# Patient Record
Sex: Female | Born: 1980 | Race: Black or African American | Hispanic: No | Marital: Single | Smoking: Former smoker
Health system: Southern US, Community
[De-identification: ages and names within clinical notes are randomized; demographics above are authoritative.]

## PROBLEM LIST (undated history)

## (undated) ENCOUNTER — Inpatient Hospital Stay (HOSPITAL_COMMUNITY): Payer: Self-pay

## (undated) DIAGNOSIS — I2510 Atherosclerotic heart disease of native coronary artery without angina pectoris: Secondary | ICD-10-CM

## (undated) DIAGNOSIS — R87629 Unspecified abnormal cytological findings in specimens from vagina: Secondary | ICD-10-CM

## (undated) DIAGNOSIS — F32A Depression, unspecified: Secondary | ICD-10-CM

## (undated) DIAGNOSIS — F1010 Alcohol abuse, uncomplicated: Secondary | ICD-10-CM

## (undated) DIAGNOSIS — K219 Gastro-esophageal reflux disease without esophagitis: Secondary | ICD-10-CM

## (undated) DIAGNOSIS — Z9151 Personal history of suicidal behavior: Secondary | ICD-10-CM

## (undated) DIAGNOSIS — D649 Anemia, unspecified: Secondary | ICD-10-CM

## (undated) DIAGNOSIS — E785 Hyperlipidemia, unspecified: Secondary | ICD-10-CM

## (undated) DIAGNOSIS — I10 Essential (primary) hypertension: Secondary | ICD-10-CM

## (undated) DIAGNOSIS — F329 Major depressive disorder, single episode, unspecified: Secondary | ICD-10-CM

## (undated) DIAGNOSIS — Z915 Personal history of self-harm: Secondary | ICD-10-CM

## (undated) HISTORY — PX: LEEP: SHX91

## (undated) HISTORY — DX: Anemia, unspecified: D64.9

## (undated) HISTORY — PX: CORONARY ANGIOPLASTY: SHX604

## (undated) HISTORY — DX: Depression, unspecified: F32.A

## (undated) HISTORY — DX: Major depressive disorder, single episode, unspecified: F32.9

## (undated) HISTORY — PX: CARDIAC CATHETERIZATION: SHX172

---

## 2008-02-15 ENCOUNTER — Emergency Department (HOSPITAL_COMMUNITY): Admission: EM | Admit: 2008-02-15 | Discharge: 2008-02-15 | Payer: Self-pay | Admitting: Emergency Medicine

## 2008-02-29 ENCOUNTER — Ambulatory Visit: Payer: Self-pay | Admitting: Gastroenterology

## 2008-02-29 ENCOUNTER — Inpatient Hospital Stay (HOSPITAL_COMMUNITY): Admission: EM | Admit: 2008-02-29 | Discharge: 2008-03-03 | Payer: Self-pay | Admitting: Emergency Medicine

## 2009-05-28 ENCOUNTER — Emergency Department (HOSPITAL_COMMUNITY): Admission: EM | Admit: 2009-05-28 | Discharge: 2009-05-28 | Payer: Self-pay | Admitting: Emergency Medicine

## 2009-05-29 ENCOUNTER — Inpatient Hospital Stay (HOSPITAL_COMMUNITY): Admission: EM | Admit: 2009-05-29 | Discharge: 2009-05-30 | Payer: Self-pay | Admitting: Emergency Medicine

## 2010-06-01 LAB — URINALYSIS, ROUTINE W REFLEX MICROSCOPIC
Glucose, UA: NEGATIVE mg/dL
Leukocytes, UA: NEGATIVE
Nitrite: NEGATIVE
Nitrite: NEGATIVE
Protein, ur: 100 mg/dL — AB
Protein, ur: 100 mg/dL — AB

## 2010-06-01 LAB — CBC
HCT: 31.7 % — ABNORMAL LOW (ref 36.0–46.0)
HCT: 35.4 % — ABNORMAL LOW (ref 36.0–46.0)
Hemoglobin: 10.4 g/dL — ABNORMAL LOW (ref 12.0–15.0)
Hemoglobin: 13.6 g/dL (ref 12.0–15.0)
MCHC: 32.9 g/dL (ref 30.0–36.0)
MCHC: 34.5 g/dL (ref 30.0–36.0)
MCV: 91.3 fL (ref 78.0–100.0)
MCV: 89.9 fL (ref 78.0–100.0)
RBC: 3.89 MIL/uL (ref 3.87–5.11)
RBC: 4.4 MIL/uL (ref 3.87–5.11)
RDW: 13.7 % (ref 11.5–15.5)
RDW: 13.8 % (ref 11.5–15.5)

## 2010-06-01 LAB — URINE MICROSCOPIC-ADD ON

## 2010-06-01 LAB — COMPREHENSIVE METABOLIC PANEL
ALT: 17 U/L (ref 0–35)
Albumin: 2.8 g/dL — ABNORMAL LOW (ref 3.5–5.2)
BUN: 9 mg/dL (ref 6–23)
CO2: 21 mEq/L (ref 19–32)
Chloride: 108 mEq/L (ref 96–112)
Creatinine, Ser: 0.86 mg/dL (ref 0.4–1.2)
GFR calc Af Amer: >60 mL/min (ref >60)
GFR calc non Af Amer: >60 mL/min (ref >60)
GFR calc non Af Amer: >60 mL/min (ref >60)
Glucose, Bld: 120 mg/dL — ABNORMAL HIGH (ref 70–99)
Potassium: 3.5 mEq/L (ref 3.5–5.1)
Potassium: 3.2 mEq/L — ABNORMAL LOW (ref 3.5–5.1)
Sodium: 143 mEq/L (ref 135–145)
Total Bilirubin: 0.7 mg/dL (ref 0.3–1.2)
Total Protein: 5.7 g/dL — ABNORMAL LOW (ref 6.0–8.3)

## 2010-06-01 LAB — DIFFERENTIAL
Basophils Absolute: 0 10*3/uL (ref 0.0–0.1)
Basophils Relative: 0 % (ref 0–1)
Eosinophils Absolute: 0 10*3/uL (ref 0.0–0.7)
Eosinophils Absolute: 0 10*3/uL (ref 0.0–0.7)
Eosinophils Relative: 0 % (ref 0–5)
Lymphocytes Relative: 20 % (ref 12–46)
Monocytes Absolute: 0.3 10*3/uL (ref 0.1–1.0)
Monocytes Relative: 5 % (ref 3–12)
Monocytes Relative: 4 % (ref 3–12)
Neutrophils Relative %: 75 % (ref 43–77)
Neutrophils Relative %: 71 % (ref 43–77)

## 2010-06-01 LAB — BASIC METABOLIC PANEL
Chloride: 111 mEq/L (ref 96–112)
GFR calc Af Amer: >60 mL/min (ref >60)

## 2010-06-01 LAB — CULTURE, BLOOD (ROUTINE X 2)

## 2010-06-01 LAB — PREGNANCY, URINE: Preg Test, Ur: NEGATIVE

## 2010-06-01 LAB — GASTRIC OCCULT BLOOD (1-CARD TO LAB)
Occult Blood, Gastric: POSITIVE — AB
pH, Gastric: 4

## 2010-07-21 NOTE — H&P (Signed)
[name], [name] NO.:  [id number]   MEDICAL RECORD NO.:  [id number]          PATIENT TYPE:  INP   LOCATION:  1528                         FACILITY:  [facility]   PHYSICIAN:  Vania Rea, M.D. DATE OF BIRTH:  Jun 08, 1980   DATE OF ADMISSION:  02/29/2008  DATE OF DISCHARGE:                              HISTORY & PHYSICAL   CHIEF COMPLAINT:  Intractable vomiting.   HISTORY OF PRESENT ILLNESS:  This is a 30 year old African American lady  who previously reported herself to be in good health until she started  vomiting since this morning.  Patient says she has been vomiting and dry-  heaving, and streaks of blood have been coming up in the vomitus.  In  the emergency room, the patient has received antiemetics but has  continued to vomit clear liquid with steaks of blood in the vomitus.  Patient says the vomiting is accompanied by left flank pain.  She has  been having no diarrhea or constipation.  No fever, no chills.  She  denies any frequency or dysuria.  She denies any vaginal discharge.  She  denies any recent foods.  She did a week ago restart antihypertensives  of hydrochlorothiazide and doxycycline for her axillary boils.   Patient gives a history of a similar episode occurring about one year  ago for which she was admitted to the medical center and investigated.  Says she had an upper endoscopy and no cause of the persistent vomiting  was found.   PAST MEDICAL HISTORY:  1. Hypertension.  2. Recent boils in the axilla along the neck.  3. History of what sounds like a cone biopsy of the cervix.   MEDICATIONS:  1. HCTZ 25 mg daily.  2. Minocycline/doxycycline unknown dose.   ALLERGIES:  No known drug allergies.   SOCIAL HISTORY:  Smokes occasionally.  Says she drinks alcohol  occasionally.  Drank 1-2 cups of egg nog yesterday.   ALLERGIES:  No history of marijuana, cocaine, or illicit drug use.  Works as a Conservation officer, nature at [employer].   FAMILY  HISTORY:  Does not know her father.  Otherwise has a history of  diabetes, hypertension, and cervical cancer in her family.   REVIEW OF SYSTEMS:  Other than noted above, patient denies any problems  on a 10-point review of systems.   PHYSICAL EXAMINATION:  An ill-looking young girl lying in the stretcher,  retching frequently.  Looking very uncomfortable.  VITALS:  Temperature 99.1, pulse 101, respirations 18, blood pressure  136/97.  She is saturating 100% on room air.  Pupils are equal and round.  Mucous membranes are pink, anicteric.  She  is dehydrated.  No cervical lymphadenopathy or thyromegaly.  CHEST:  Clear to auscultation bilaterally.  CARDIOVASCULAR:  Regular rhythm.  No murmur.  ABDOMEN:  She is tender in the epigastrium.  She has increased bowel  sounds.  EXTREMITIES:  Without edema.  CENTRAL NERVOUS SYSTEM:  Cranial nerves II-XII are grossly intact.  She  has no focal neurological deficits.   LABS:  CBC is unremarkable.  Her serum chemistry is significant for  sodium 136, potassium 2.6, glucose 127, BUN 16, creatinine 1.06,  otherwise unremarkable.  Her lipase is 31.  Her urinalysis:  She has  turbid urine, specific gravity 1.035, small amount of ketones, 100  protein, negative for nitrites, small-moderate leukocyte esterase.  Her  urine pregnancy test is negative.   ASSESSMENT:  1. Acute gastroenteritis of unclear etiology with possible alcohol-      induced via egg nog, possibly induced via medication.  2. Severe hypokalemia associated with intractable vomiting.   PLAN:  1. Will admit this lady for hydration, repletion of her potassium, and      will administer anti-emetics and give her the benefit of a GI      consult with a possible upper endoscopy.  2. Will attempt to get a copy of her admission records from [facility].  3. Other plans as per orders.      Vania Rea, M.D.  Electronically Signed     LC/MEDQ  D:  02/29/2008  T:   02/29/2008  Job:  [id number]

## 2010-07-21 NOTE — Discharge Summary (Signed)
NAMESANVIKA, [name] NO.:  [id number]   MEDICAL RECORD NO.:  [id number]          PATIENT TYPE:  INP   LOCATION:  1528                         FACILITY:  [facility]   PHYSICIAN:  [name], M.D.    DATE OF BIRTH:  09-Mar-1980   DATE OF ADMISSION:  02/29/2008  DATE OF DISCHARGE:  03/03/2008                               DISCHARGE SUMMARY   DISCHARGE DIAGNOSES:  1. Intractable nausea and vomiting associated with reported mild      hematemesis, etiology unclear; possibly antibiotic-induced.  2. Esophagitis in the distal esophagus per EGD on February 29, 2008.      The esophagitis was thought to be related to the nausea and      vomiting rather than the cause.  3. Hiatal hernia per EGD.  4. Hypokalemia.  5. Hypertension.  6. Possible intolerance to doxycycline.   DISCHARGE MEDICATIONS:  1. Protonix 40 mg daily.  2. Potassium chloride 20 mEq a half a tablet daily.  3. Hydrochlorothiazide 25 mg half a tablet daily.  4. Phenergan 25 mg 1 tablet every 4-6 hours as needed for nausea.   DISCHARGE DISPOSITION:  The patient is being discharged to home in  improved and stable condition.  She was given the number to the  [facility].  She was advised to follow up with the [facility] or a physician of choice in 2-3 weeks.   CONSULTATIONS:  Ariana Bunting, MD and Ariana Rod, MD.   PROCEDURES PERFORMED:  1. Ultrasound of the abdomen on March 01, 2008.  The results      revealed negative abdominal ultrasound.  No gallstones.  2. EGD performed on February 29, 2008.  The results revealed      esophagitis in the distal esophagus.  Hiatal hernia.  Otherwise      normal examination.   HISTORY OF PRESENT ILLNESS:  The patient is a 30 year old woman with a  past medical history significant for recent treatment for an axillary  infection with doxycycline who presented to the emergency department on  February 29, 2008, with a chief complaint of intractable  nausea and  vomiting.  The patient also reported seeing streaks of blood in her  emesis.  She was recently started on antibiotic treatment with  doxycycline for axillary boils and hydrochlorothiazide for treatment  of hypertension by the physician at a local urgent care.  When the  patient was evaluated in the emergency department she was noted to be  borderline febrile and otherwise hemodynamically stable.  An acute  abdominal series was ordered and it revealed a normal bowel gas pattern  and clear lungs.  Her urinalysis revealed small leukocytes and her urine  pregnancy test was negative.  Her WBC was within normal limits at 9.2.  Her lipase was 31.  Her serum potassium was 2.6.  Her liver  transaminases were within normal limits.  The patient was admitted for  further evaluation and management.   PAST MEDICAL HISTORY:  1. Intractable nausea and vomiting.  The patient was started on      intravenous Protonix 40 mg IV q.12  h.  Maintenance IV fluids were      given.  As needed antiemetic therapy was initiated with Zofran 4 mg      every 4 hours.  In addition to the Protonix, Reglan was given      scheduled at 10 mg IV q.6 h.  The patient was repleted with      potassium chloride intravenously.  Gastroenterologist, Dr. Christella Kelley,      was consulted and he evaluated the patient on the day of admission.      He performed an EGD which revealed esophagitis in the distal      esophagus and a hiatal hernia.  Per his impression, the esophagitis      was likely the result of persistent vomiting rather than the cause.      He believed that the nausea and vomiting may have been secondary to      an infectious gastroenteritis versus nausea and vomiting induced by      doxycycline which was apparently taken by the patient for a local      axillary infection.  He added scheduled Zofran and Phenergan.  Dr.      Charna Kelley provided the followup gastroenterology consultation.      She recommended  obtaining an ultrasound of the abdomen to rule out      biliary disease.  The ultrasound of the abdomen was normal and      there was no evidence of gallstones.  The patient's diet was      eventually advanced to a clear liquid diet.  The nausea and      vomiting eventually completely resolved.  Her diet was advanced to      a full liquid diet which she has tolerated well.  Upon discharge,      the patient was advised to avoid doxycycline and to continue      therapy with Protonix orally.  In addition, Phenergan was      prescribed as needed for recurrent nausea and vomiting.  2. Hypertension.  The patient's blood pressure was stable and      controlled off of hydrochlorothiazide during the hospitalization.      Upon discharge, she was advised to restart hydrochlorothiazide at      half the dose (12.5 mg daily).  As noted above, the patient's serum      potassium was 2.6 at the time of the initial hospital assessment.      The hypokalemia was felt to be secondary to hydrochlorothiazide      therapy and intractable nausea and vomiting.  Her magnesium level      was assessed and it was within normal limits at 1.9.  The patient      was repleted with potassium chloride orally and via the IV fluids.      Upon discharge, she was advised to continue potassium      supplementation.   LABS PENDING:  Urine LCR for chlamydia and gonorrhea.      [name], M.D.  Electronically Signed     DF/MEDQ  D:  03/03/2008  T:  03/03/2008  Job:  [id number]

## 2010-12-11 LAB — RAPID URINE DRUG SCREEN, HOSP PERFORMED
Barbiturates: NOT DETECTED
Benzodiazepines: NOT DETECTED
Cocaine: NOT DETECTED
Opiates: NOT DETECTED

## 2010-12-11 LAB — TSH: TSH: 0.486 u[IU]/mL (ref 0.350–4.500)

## 2010-12-11 LAB — DIFFERENTIAL
Basophils Absolute: 0.1 10*3/uL (ref 0.0–0.1)
Eosinophils Relative: 1 % (ref 0–5)
Lymphocytes Relative: 33 % (ref 12–46)
Lymphs Abs: 3 10*3/uL (ref 0.7–4.0)
Monocytes Relative: 6 % (ref 3–12)
Neutro Abs: 5.4 10*3/uL (ref 1.7–7.7)

## 2010-12-11 LAB — CBC
HCT: 34.7 % — ABNORMAL LOW (ref 36.0–46.0)
HCT: 36.1 % (ref 36.0–46.0)
HCT: 43.2 % (ref 36.0–46.0)
Hemoglobin: 11.8 g/dL — ABNORMAL LOW (ref 12.0–15.0)
MCHC: 34.1 g/dL (ref 30.0–36.0)
MCV: 90.3 fL (ref 78.0–100.0)
MCV: 90.8 fL (ref 78.0–100.0)
MCV: 91.7 fL (ref 78.0–100.0)
Platelets: 226 10*3/uL (ref 150–400)
RBC: 3.79 MIL/uL — ABNORMAL LOW (ref 3.87–5.11)
RBC: 3.97 MIL/uL (ref 3.87–5.11)
RDW: 13.4 % (ref 11.5–15.5)
WBC: 7.8 10*3/uL (ref 4.0–10.5)
WBC: 8 10*3/uL (ref 4.0–10.5)

## 2010-12-11 LAB — COMPREHENSIVE METABOLIC PANEL
AST: 26 U/L (ref 0–37)
Albumin: 4.4 g/dL (ref 3.5–5.2)
BUN: 16 mg/dL (ref 6–23)
BUN: 4 mg/dL — ABNORMAL LOW (ref 6–23)
CO2: 20 mEq/L (ref 19–32)
Calcium: 10.2 mg/dL (ref 8.4–10.5)
Chloride: 111 mEq/L (ref 96–112)
Creatinine, Ser: 0.78 mg/dL (ref 0.4–1.2)
Creatinine, Ser: 1.06 mg/dL (ref 0.4–1.2)
GFR calc Af Amer: >60 mL/min (ref >60)
GFR calc non Af Amer: >60 mL/min (ref >60)
Total Bilirubin: 0.9 mg/dL (ref 0.3–1.2)

## 2010-12-11 LAB — URINALYSIS, ROUTINE W REFLEX MICROSCOPIC
Glucose, UA: NEGATIVE mg/dL
Hgb urine dipstick: NEGATIVE
Ketones, ur: 15 mg/dL — AB
Nitrite: NEGATIVE
pH: 6 (ref 5.0–8.0)

## 2010-12-11 LAB — GC/CHLAMYDIA PROBE AMP, URINE
Chlamydia, Swab/Urine, PCR: NEGATIVE
GC Probe Amp, Urine: NEGATIVE

## 2010-12-11 LAB — BASIC METABOLIC PANEL
BUN: 14 mg/dL (ref 6–23)
CO2: 21 mEq/L (ref 19–32)
Calcium: 9.1 mg/dL (ref 8.4–10.5)
Chloride: 112 mEq/L (ref 96–112)
Creatinine, Ser: 0.84 mg/dL (ref 0.4–1.2)
Creatinine, Ser: 0.84 mg/dL (ref 0.4–1.2)
GFR calc Af Amer: >60 mL/min (ref >60)
GFR calc Af Amer: >60 mL/min (ref >60)
Sodium: 142 mEq/L (ref 135–145)

## 2010-12-11 LAB — URINE MICROSCOPIC-ADD ON

## 2010-12-11 LAB — POCT PREGNANCY, URINE: Preg Test, Ur: NEGATIVE

## 2010-12-11 LAB — CARDIAC PANEL(CRET KIN+CKTOT+MB+TROPI): Total CK: 193 U/L — ABNORMAL HIGH (ref 7–177)

## 2013-08-08 ENCOUNTER — Emergency Department (HOSPITAL_COMMUNITY): Payer: Medicaid Other

## 2013-08-08 ENCOUNTER — Encounter (HOSPITAL_COMMUNITY): Payer: Self-pay | Admitting: Emergency Medicine

## 2013-08-08 ENCOUNTER — Inpatient Hospital Stay (HOSPITAL_COMMUNITY)
Admission: EM | Admit: 2013-08-08 | Discharge: 2013-08-11 | DRG: 781 | Disposition: A | Discharge status: Home / Self Care | Payer: Medicaid Other | Attending: Family Medicine | Admitting: Family Medicine

## 2013-08-08 DIAGNOSIS — Z8719 Personal history of other diseases of the digestive system: Secondary | ICD-10-CM

## 2013-08-08 DIAGNOSIS — E876 Hypokalemia: Secondary | ICD-10-CM

## 2013-08-08 DIAGNOSIS — O211 Hyperemesis gravidarum with metabolic disturbance: Secondary | ICD-10-CM

## 2013-08-08 DIAGNOSIS — F1010 Alcohol abuse, uncomplicated: Secondary | ICD-10-CM | POA: Diagnosis present

## 2013-08-08 DIAGNOSIS — O210 Mild hyperemesis gravidarum: Secondary | ICD-10-CM | POA: Diagnosis present

## 2013-08-08 DIAGNOSIS — I10 Essential (primary) hypertension: Secondary | ICD-10-CM | POA: Diagnosis present

## 2013-08-08 DIAGNOSIS — O99340 Other mental disorders complicating pregnancy, unspecified trimester: Secondary | ICD-10-CM | POA: Diagnosis present

## 2013-08-08 DIAGNOSIS — E860 Dehydration: Secondary | ICD-10-CM

## 2013-08-08 DIAGNOSIS — O10019 Pre-existing essential hypertension complicating pregnancy, unspecified trimester: Secondary | ICD-10-CM

## 2013-08-08 DIAGNOSIS — Z9889 Other specified postprocedural states: Secondary | ICD-10-CM | POA: Diagnosis present

## 2013-08-08 DIAGNOSIS — Z87898 Personal history of other specified conditions: Secondary | ICD-10-CM

## 2013-08-08 DIAGNOSIS — Z3490 Encounter for supervision of normal pregnancy, unspecified, unspecified trimester: Secondary | ICD-10-CM

## 2013-08-08 DIAGNOSIS — O3443 Maternal care for other abnormalities of cervix, third trimester: Secondary | ICD-10-CM | POA: Diagnosis present

## 2013-08-08 DIAGNOSIS — K219 Gastro-esophageal reflux disease without esophagitis: Secondary | ICD-10-CM | POA: Diagnosis present

## 2013-08-08 HISTORY — DX: Gastro-esophageal reflux disease without esophagitis: K21.9

## 2013-08-08 HISTORY — DX: Essential (primary) hypertension: I10

## 2013-08-08 LAB — POC URINE PREG, ED: PREG TEST UR: POSITIVE — AB

## 2013-08-08 LAB — URINALYSIS, ROUTINE W REFLEX MICROSCOPIC
Glucose, UA: NEGATIVE mg/dL
Glucose, UA: NEGATIVE mg/dL
HGB URINE DIPSTICK: NEGATIVE
Hgb urine dipstick: NEGATIVE
Ketones, ur: 15 mg/dL — AB
LEUKOCYTES UA: NEGATIVE
NITRITE: NEGATIVE
NITRITE: NEGATIVE
PROTEIN: 100 mg/dL — AB
Protein, ur: 100 mg/dL — AB
SPECIFIC GRAVITY, URINE: 1.023 (ref 1.005–1.030)
Specific Gravity, Urine: >1.03 — ABNORMAL HIGH (ref 1.005–1.030)
UROBILINOGEN UA: 1 mg/dL (ref 0.0–1.0)
Urobilinogen, UA: 1 mg/dL (ref 0.0–1.0)
pH: 6 (ref 5.0–8.0)
pH: 6 (ref 5.0–8.0)

## 2013-08-08 LAB — COMPREHENSIVE METABOLIC PANEL
ALK PHOS: 76 U/L (ref 39–117)
ALT: 15 U/L (ref 0–35)
AST: 16 U/L (ref 0–37)
Albumin: 4.3 g/dL (ref 3.5–5.2)
BUN: 12 mg/dL (ref 6–23)
CO2: 18 meq/L — AB (ref 19–32)
Calcium: 10.4 mg/dL (ref 8.4–10.5)
Chloride: 99 mEq/L (ref 96–112)
Creatinine, Ser: 0.92 mg/dL (ref 0.50–1.10)
GFR, EST NON AFRICAN AMERICAN: 82 mL/min — AB (ref >90)
GLUCOSE: 139 mg/dL — AB (ref 70–99)
POTASSIUM: 2.9 meq/L — AB (ref 3.7–5.3)
SODIUM: 138 meq/L (ref 137–147)
Total Bilirubin: 0.5 mg/dL (ref 0.3–1.2)
Total Protein: 8.5 g/dL — ABNORMAL HIGH (ref 6.0–8.3)

## 2013-08-08 LAB — GAMMA GT: GGT: 46 U/L (ref 7–51)

## 2013-08-08 LAB — CBC WITH DIFFERENTIAL/PLATELET
Basophils Absolute: 0 10*3/uL (ref 0.0–0.1)
Basophils Relative: 0 % (ref 0–1)
EOS PCT: 2 % (ref 0–5)
Eosinophils Absolute: 0.1 10*3/uL (ref 0.0–0.7)
HCT: 39.2 % (ref 36.0–46.0)
HEMOGLOBIN: 14.3 g/dL (ref 12.0–15.0)
LYMPHS ABS: 3.3 10*3/uL (ref 0.7–4.0)
LYMPHS PCT: 41 % (ref 12–46)
MCH: 31.1 pg (ref 26.0–34.0)
MCHC: 36.5 g/dL — ABNORMAL HIGH (ref 30.0–36.0)
MCV: 85.2 fL (ref 78.0–100.0)
MONOS PCT: 8 % (ref 3–12)
Monocytes Absolute: 0.6 10*3/uL (ref 0.1–1.0)
NEUTROS PCT: 49 % (ref 43–77)
Neutro Abs: 3.9 10*3/uL (ref 1.7–7.7)
PLATELETS: 270 10*3/uL (ref 150–400)
RBC: 4.6 MIL/uL (ref 3.87–5.11)
RDW: 12.4 % (ref 11.5–15.5)
WBC: 7.9 10*3/uL (ref 4.0–10.5)

## 2013-08-08 LAB — URINE MICROSCOPIC-ADD ON

## 2013-08-08 LAB — RAPID URINE DRUG SCREEN, HOSP PERFORMED
AMPHETAMINES: NOT DETECTED
BARBITURATES: NOT DETECTED
BENZODIAZEPINES: NOT DETECTED
COCAINE: NOT DETECTED
Opiates: NOT DETECTED
TETRAHYDROCANNABINOL: POSITIVE — AB

## 2013-08-08 LAB — HCG, QUANTITATIVE, PREGNANCY: HCG, BETA CHAIN, QUANT, S: 4895 m[IU]/mL — AB (ref <5)

## 2013-08-08 LAB — TSH: TSH: 0.52 u[IU]/mL (ref 0.350–4.500)

## 2013-08-08 LAB — T4, FREE: Free T4: 1.37 ng/dL (ref 0.80–1.80)

## 2013-08-08 LAB — T3, FREE: T3, Free: 3 pg/mL (ref 2.3–4.2)

## 2013-08-08 LAB — LIPASE, BLOOD: LIPASE: 35 U/L (ref 11–59)

## 2013-08-08 MED ORDER — ACETAMINOPHEN 325 MG PO TABS: 650.0000 mg | ORAL_TABLET | ORAL | Status: DC | PRN

## 2013-08-08 MED ORDER — ONDANSETRON HCL 4 MG/2ML IJ SOLN
4.0000 mg | Freq: Once | INTRAMUSCULAR | Status: AC
  Administered 2013-08-08: 4 mg via INTRAVENOUS
  Filled 2013-08-08: qty 2

## 2013-08-08 MED ORDER — MAGNESIUM SULFATE 40 MG/ML IJ SOLN
2.0000 g | Freq: Once | INTRAMUSCULAR | Status: DC
  Filled 2013-08-08: qty 50

## 2013-08-08 MED ORDER — ONDANSETRON HCL 4 MG PO TABS: 4.0000 mg | ORAL_TABLET | Freq: Four times a day (QID) | ORAL | Status: DC | PRN

## 2013-08-08 MED ORDER — METOCLOPRAMIDE HCL 5 MG/ML IJ SOLN
10.0000 mg | Freq: Once | INTRAMUSCULAR | Status: AC
  Administered 2013-08-08: 10 mg via INTRAVENOUS
  Filled 2013-08-08: qty 2

## 2013-08-08 MED ORDER — POTASSIUM CHLORIDE 10 MEQ/100ML IV SOLN
10.0000 meq | Freq: Once | INTRAVENOUS | Status: AC
  Administered 2013-08-08: 10 meq via INTRAVENOUS
  Filled 2013-08-08: qty 100

## 2013-08-08 MED ORDER — FAMOTIDINE IN NACL 20-0.9 MG/50ML-% IV SOLN
20.0000 mg | Freq: Two times a day (BID) | INTRAVENOUS | Status: DC
  Administered 2013-08-08 – 2013-08-10 (×6): 20 mg via INTRAVENOUS
  Filled 2013-08-08 (×7): qty 50

## 2013-08-08 MED ORDER — ALUM & MAG HYDROXIDE-SIMETH 200-200-20 MG/5ML PO SUSP: 30.0000 mL | ORAL | Status: DC | PRN

## 2013-08-08 MED ORDER — GUAIFENESIN 100 MG/5ML PO SOLN: 15.0000 mL | ORAL | Status: DC | PRN

## 2013-08-08 MED ORDER — SODIUM CHLORIDE 0.9 % IV SOLN
Freq: Once | INTRAVENOUS | Status: AC
  Administered 2013-08-08: 13:00:00 via INTRAVENOUS
  Filled 2013-08-08: qty 1000

## 2013-08-08 MED ORDER — MENTHOL 3 MG MT LOZG: 1.0000 | LOZENGE | OROMUCOSAL | Status: DC | PRN

## 2013-08-08 MED ORDER — MAGNESIUM SULFATE 50 % IJ SOLN
Freq: Once | INTRAVENOUS | Status: AC
  Administered 2013-08-08: 18:00:00 via INTRAVENOUS
  Filled 2013-08-08: qty 50

## 2013-08-08 MED ORDER — PROMETHAZINE HCL 25 MG/ML IJ SOLN
12.5000 mg | Freq: Four times a day (QID) | INTRAMUSCULAR | Status: DC | PRN
  Administered 2013-08-08 – 2013-08-09 (×4): 25 mg via INTRAVENOUS
  Administered 2013-08-10: 12.5 mg via INTRAVENOUS
  Filled 2013-08-08 (×4): qty 1

## 2013-08-08 MED ORDER — ONDANSETRON HCL 4 MG/2ML IJ SOLN
4.0000 mg | Freq: Four times a day (QID) | INTRAMUSCULAR | Status: DC | PRN
  Administered 2013-08-08 – 2013-08-10 (×6): 4 mg via INTRAVENOUS
  Filled 2013-08-08 (×6): qty 2

## 2013-08-08 MED ORDER — KCL IN DEXTROSE-NACL 40-5-0.45 MEQ/L-%-% IV SOLN
INTRAVENOUS | Status: DC
  Administered 2013-08-08 – 2013-08-11 (×5): via INTRAVENOUS
  Filled 2013-08-08 (×11): qty 1000

## 2013-08-08 MED ORDER — MAGNESIUM SULFATE 40 MG/ML IJ SOLN: 2.0000 g | Freq: Once | INTRAMUSCULAR | Status: DC

## 2013-08-08 NOTE — ED Notes (Signed)
Dr. Bebe Shaggy made aware of pts critical low Potassium of 2.9.

## 2013-08-08 NOTE — Progress Notes (Signed)
Ur chart review completed.  

## 2013-08-08 NOTE — ED Notes (Signed)
Pt presents to the department with N/V for 3 days. Pt denies pain but states she feels very weak. Pt denies diarrhea. Pt has a hx of GERD and acid reflux. EMS reports pt actively vomited 2X en route to department. Pt denies fever. Pt is A&O X4.

## 2013-08-08 NOTE — ED Notes (Signed)
Dr. Bebe Shaggy at bedside with [place] machine.

## 2013-08-08 NOTE — ED Notes (Addendum)
Spoke Bridget at [facility] at [facility], pt is going to room # 302.

## 2013-08-08 NOTE — Consult Note (Addendum)
[facility] Medical Consultation  Caydance Writer LSL:[id number] DOB: 1980-05-21 DOA: 08/08/2013 PCP: No PCP Per Patient   Requesting physician: EDP Date of consultation: 6/3 Reason for consultation: nausea and vomiting  Impression/Recommendations Principal Problem:   Hyperemesis gravidarum Active Problems:   Dehydration   Hypokalemia   Recommendation: In My opinion she should be admitted to [facility] since this problem is purely obstetric in etiology or otherwise Request OB consult if admitted here. Needs IVF, supportive care, prenatal care and meds as appropriate per OB Replace K D/w EDP He will d/w OB and call us back as needed  Zannie Cove, MD [phone]   Chief Complaint: N/V  HPI:  32/F presented to the ER this am with intractable Nausea and vomiting for 3 days, No h/o abd pain or diarrhea No fevers or chills In ER noted to be pregnant and OB US confirmed Single intrauterine gestation, estimated age 68 weeks 4 days. TRH consulted for further management.    Review of Systems:  12 system review negative except per HPI  Past Medical History  Diagnosis Date  . GERD (gastroesophageal reflux disease)   . Acid reflux   . Hypertension    History reviewed. No pertinent past surgical history. Social History:  reports that she has never smoked. She does not have any smokeless tobacco history on file. She reports that she drinks alcohol. Her drug history is not on file.  No Known Allergies No family history on file.  Prior to Admission medications   Not on File   Physical Exam: Blood pressure 146/96, pulse 111, temperature 98.9 F (37.2 C), temperature source Oral, resp. rate 10, last menstrual period 07/01/2013, SpO2 100.00%. Filed Vitals:   08/08/13 0900  BP: 146/96  Pulse: 111  Temp:   Resp: 10     General:  AAOx3, no distress  HEENt: PERRLA, EOMI  Cardiovascular: S1S2/RRR  Respiratory: CTAB  Abdomen: soft, Nt, BS  present  Skin: no rashes  Musculoskeletal: no edema c/c  Psychiatric: appropriate mood and affect  Neurologic: non focal  Labs on Admission:  Basic Metabolic Panel:  Recent Labs Lab 08/08/13 0455  NA 138  K 2.9*  CL 99  CO2 18*  GLUCOSE 139*  BUN 12  CREATININE 0.92  CALCIUM 10.4   Liver Function Tests:  Recent Labs Lab 08/08/13 0455  AST 16  ALT 15  ALKPHOS 76  BILITOT 0.5  PROT 8.5*  ALBUMIN 4.3    Recent Labs Lab 08/08/13 0455  LIPASE 35   No results found for this basename: AMMONIA,  in the last 168 hours CBC:  Recent Labs Lab 08/08/13 0455  WBC 7.9  NEUTROABS 3.9  HGB 14.3  HCT 39.2  MCV 85.2  PLT 270   Cardiac Enzymes: No results found for this basename: CKTOTAL, CKMB, CKMBINDEX, TROPONINI,  in the last 168 hours BNP: No components found with this basename: POCBNP,  CBG: No results found for this basename: GLUCAP,  in the last 168 hours  Radiological Exams on Admission: US Ob Comp Less 14 Wks  08/08/2013   CLINICAL DATA:  Emesis and weakness  EXAM: OBSTETRIC <14 WK ULTRASOUND  TECHNIQUE: Transabdominal ultrasound was performed for evaluation of the gestation as well as the maternal uterus and adnexal regions.  COMPARISON:  None.  FINDINGS: Patient refused transvaginal evaluation.  Intrauterine gestational sac: Visualized/normal in shape.  Yolk sac:  Not visible  Embryo:  Not visible  MSD: 7.6  mm   5 w   4  d                US EDC: 04/06/2014  Maternal uterus/adnexae: Symmetrically size and normal appearing ovaries. Dominant follicle on the right, 2 cm in diameter. No adnexal mass. No free pelvic fluid.  IMPRESSION: Single intrauterine gestation, estimated age 70 weeks 4 days. As expected, no yolk sac or fetal pole yet visualized. Recommend follow-up quantitative B-HCG levels and follow-up US in 14 days to confirm and assess viability. This recommendation follows SRU consensus guidelines: Diagnostic Criteria for Nonviable Pregnancy Early in the  First Trimester. Malva Limes Engl J Med 2013; 161:0960-45; 369:1443-51.   Electronically Signed   By: Tiburcio PeaJonathan  Watts M.D.   On: 08/08/2013 07:11    Time spent: 35min  Zannie Covereetha Declyn Delsol [facility] Pager [phone]  If 7PM-7AM, please contact night-coverage [email or web address] Password [email or web address] 08/08/2013, 9:47 AM

## 2013-08-08 NOTE — H&P (Signed)
[name] is an 33 y.o. [phone] Unknown female.    Chief Complaint: Nausea and vomiting  HPI: Patient transferred here after presenting to [facility] with N/V.  Was found to be hypokalemic, with low bicarb and was also found to be pregnant.  She was sent over for re-hydration and treatment purposes. Pt. Has been admitted twice in the past with similar presentations in the non-pregnant state. Has undergone EGD x 2 with gastritis found and placed on PPI's. At both previous admissions in 2009 and in 2011, there was concern about alcohol use. Pt. Is a poor historian, partly due to medcations given. She may or may not have had hyperemesis in the past.  Hospitalists declined to admit her because of pregnancy.  Pt. Has h/o alcohol use in the past, although she reports little alcohol use today. Some blood tinged emesis noted. Received reglan and zofran at [facility].  TVUS shows IUGS, no yolk sac and no fetal pole.  Past Medical History  Diagnosis Date  . GERD (gastroesophageal reflux disease)   . Acid reflux   . Hypertension     History reviewed. No pertinent past surgical history.  No family history on file.  Social History:  reports that she has never smoked. She does not have any smokeless tobacco history on file. She reports that she drinks alcohol. Her drug history is not on file.  Allergies: No Known Allergies  No current facility-administered medications on file prior to encounter.   No current outpatient prescriptions on file prior to encounter.   ROS: Pertinent items are noted in HPI.  Physical Exam: Blood pressure 121/77, pulse 97, temperature 98.5 F (36.9 C), temperature source Oral, resp. rate 18, weight 182 lb 8 oz (82.781 kg), last menstrual period 07/01/2013, SpO2 100.00%. BP 121/77  Pulse 97  Temp(Src) 98.5 F (36.9 C) (Oral)  Resp 18  Wt 182 lb 8 oz (82.781 kg)  SpO2 100%  LMP 07/01/2013 General appearance: appears older than stated age, no distress and  lethargic Neck: supple, symmetrical, trachea midline Lungs: normal effort Heart: regular rate and rhythm Abdomen: soft, non-tender; bowel sounds normal; no masses,  no organomegaly Extremities: extremities normal, atraumatic, no cyanosis or edema Skin: Skin color, texture, turgor normal. No rashes or lesions Neurologic: Mental status: Alert, oriented, thought content appropriate, alertness: lethargic  Labs: Results for orders placed during the hospital encounter of 08/08/13 (from the past 24 hour(s))  CBC WITH DIFFERENTIAL   Collection Time    08/08/13  4:55 AM      Result Value Ref Range   WBC 7.9  4.0 - 10.5 K/uL   RBC 4.60  3.87 - 5.11 MIL/uL   Hemoglobin 14.3  12.0 - 15.0 g/dL   HCT 56.3  14.9 - 70.2 %   MCV 85.2  78.0 - 100.0 fL   MCH 31.1  26.0 - 34.0 pg   MCHC 36.5 (*) 30.0 - 36.0 g/dL   RDW 63.7  85.8 - 85.0 %   Platelets 270  150 - 400 K/uL   Neutrophils Relative % 49  43 - 77 %   Neutro Abs 3.9  1.7 - 7.7 K/uL   Lymphocytes Relative 41  12 - 46 %   Lymphs Abs 3.3  0.7 - 4.0 K/uL   Monocytes Relative 8  3 - 12 %   Monocytes Absolute 0.6  0.1 - 1.0 K/uL   Eosinophils Relative 2  0 - 5 %   Eosinophils Absolute 0.1  0.0 - 0.7 K/uL  Basophils Relative 0  0 - 1 %   Basophils Absolute 0.0  0.0 - 0.1 K/uL  COMPREHENSIVE METABOLIC PANEL   Collection Time    08/08/13  4:55 AM      Result Value Ref Range   Sodium 138  137 - 147 mEq/L   Potassium 2.9 (*) 3.7 - 5.3 mEq/L   Chloride 99  96 - 112 mEq/L   CO2 18 (*) 19 - 32 mEq/L   Glucose, Bld 139 (*) 70 - 99 mg/dL   BUN 12  6 - 23 mg/dL   Creatinine, Ser 4.090.92  0.50 - 1.10 mg/dL   Calcium 81.110.4  8.4 - 91.410.5 mg/dL   Total Protein 8.5 (*) 6.0 - 8.3 g/dL   Albumin 4.3  3.5 - 5.2 g/dL   AST 16  0 - 37 U/L   ALT 15  0 - 35 U/L   Alkaline Phosphatase 76  39 - 117 U/L   Total Bilirubin 0.5  0.3 - 1.2 mg/dL   GFR calc non Af Amer 82 (*) >90 mL/min   GFR calc Af Amer >90  >90 mL/min  LIPASE, BLOOD   Collection Time     08/08/13  4:55 AM      Result Value Ref Range   Lipase 35  11 - 59 U/L  URINALYSIS, ROUTINE W REFLEX MICROSCOPIC   Collection Time    08/08/13  5:36 AM      Result Value Ref Range   Color, Urine YELLOW  YELLOW   APPearance TURBID (*) CLEAR   Specific Gravity, Urine 1.023  1.005 - 1.030   pH 6.0  5.0 - 8.0   Glucose, UA NEGATIVE  NEGATIVE mg/dL   Hgb urine dipstick NEGATIVE  NEGATIVE   Bilirubin Urine SMALL (*) NEGATIVE   Ketones, ur 15 (*) NEGATIVE mg/dL   Protein, ur 782100 (*) NEGATIVE mg/dL   Urobilinogen, UA 1.0  0.0 - 1.0 mg/dL   Nitrite NEGATIVE  NEGATIVE   Leukocytes, UA SMALL (*) NEGATIVE  URINE MICROSCOPIC-ADD ON   Collection Time    08/08/13  5:36 AM      Result Value Ref Range   Squamous Epithelial / LPF MANY (*) RARE   WBC, UA 7-10  <3 WBC/hpf   RBC / HPF 0-2  <3 RBC/hpf   Bacteria, UA FEW (*) RARE   Casts HYALINE CASTS (*) NEGATIVE  POC URINE PREG, ED   Collection Time    08/08/13  5:49 AM      Result Value Ref Range   Preg Test, Ur POSITIVE (*) NEGATIVE  HCG, QUANTITATIVE, PREGNANCY   Collection Time    08/08/13  6:33 AM      Result Value Ref Range   hCG, Beta Chain, Quant, S 4895 (*) <5 mIU/mL    Ultrasound Studies:   [place] Ob Comp Less 14 Wks  08/08/2013   CLINICAL DATA:  Emesis and weakness  EXAM: OBSTETRIC <14 WK ULTRASOUND  TECHNIQUE: Transabdominal ultrasound was performed for evaluation of the gestation as well as the maternal uterus and adnexal regions.  COMPARISON:  None.  FINDINGS: Patient refused transvaginal evaluation.  Intrauterine gestational sac: Visualized/normal in shape.  Yolk sac:  Not visible  Embryo:  Not visible  MSD: 7.6  mm   5 w   4  d                US EDC: 04/06/2014  Maternal uterus/adnexae: Symmetrically size and normal appearing ovaries. Dominant follicle on the  right, 2 cm in diameter. No adnexal mass. No free pelvic fluid.  IMPRESSION: Single intrauterine gestation, estimated age 74 weeks 4 days. As expected, no yolk sac or fetal pole  yet visualized. Recommend follow-up quantitative B-HCG levels and follow-up US in 14 days to confirm and assess viability. This recommendation follows SRU consensus guidelines: Diagnostic Criteria for Nonviable Pregnancy Early in the First Trimester. Malva Limes Med 2013; 161:0960-45.   Electronically Signed   By: Tiburcio Pea M.D.   On: 08/08/2013 07:11    Assessment/Plan Patient Active Problem List   Diagnosis Date Noted  . Hyperemesis gravidarum 08/08/2013  . Dehydration 08/08/2013  . Hypokalemia 08/08/2013  . Hyperemesis complicating pregnancy, antepartum 08/08/2013   Admission. IV hydration Replete potassium Check TSH, UDS, BAL  Reva Bores 08/08/2013, 1:10 PM

## 2013-08-08 NOTE — ED Notes (Signed)
Pt talking on the phone. Is waiting  For carelink.

## 2013-08-08 NOTE — ED Provider Notes (Signed)
CSN: [id number]     Arrival date & time 08/08/13  0435 History   First MD Initiated Contact with Patient 08/08/13 [phone]     Chief Complaint  Patient presents with  . Emesis  . Weakness      Patient is a 33 y.o. female presenting with vomiting and weakness. The history is provided by the patient.  Emesis Severity:  Moderate Duration:  3 days Timing:  Intermittent Progression:  Worsening Chronicity:  New Relieved by:  Nothing Worsened by:  Nothing tried Associated symptoms: chills and cough   Associated symptoms: no abdominal pain, no diarrhea, no fever and no headaches   Weakness Pertinent negatives include no chest pain, no abdominal pain and no headaches.  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 GERD (gastroesophageal reflux disease)   . Acid reflux   . Hypertension    History reviewed. No pertinent past surgical history. No family history on file. History  Substance Use Topics  . Smoking status: Never Smoker   . Smokeless tobacco: Not on file  . Alcohol Use: Yes     Comment: occassionally   OB History   Grav Para Term Preterm Abortions TAB SAB Ect Mult Living                 Review of Systems  Constitutional: Positive for chills.  Cardiovascular: Negative for chest pain.  Gastrointestinal: Positive for vomiting. Negative for abdominal pain and diarrhea.  Genitourinary: Negative for vaginal bleeding.  Neurological: Positive for weakness. Negative for headaches.  All other systems reviewed and are negative.     Allergies  Review of patient's allergies indicates no known allergies.  Home Medications   Prior to Admission medications   Not on File   BP 152/107  Pulse 121  Temp(Src) 98.8 F (37.1 C) (Oral)   SpO2 90%  LMP 07/01/2013 Physical Exam CONSTITUTIONAL: Well developed/well nourished HEAD: Normocephalic/atraumatic EYES: EOMI/PERRL ENMT: Mucous membranes dry NECK: supple no meningeal signs SPINE:entire spine nontender CV: S1/S2 noted, no murmurs/rubs/gallops noted LUNGS: Lungs are clear to auscultation bilaterally, no apparent distress ABDOMEN: soft, nontender, no rebound or guarding GU:no cva tenderness NEURO: Pt is awake/alert, moves all extremitiesx4 EXTREMITIES: pulses normal, full ROM SKIN: warm, color normal PSYCH: no abnormalities of mood noted  ED Course  Procedures  5:10 AM Pt here with isolated vomiting for 3 days. She is without pain She appears dehydrated IV fluids ordered She reports some blood mixed in vomitus after multiple episodes of vomiting.  I doubt acute upper GI bleed 6:20 AM Pt continues to vomit She denies abd pain She is found to be pregnant She reports LMP end of April.  Bedside TA ultrasound unable to detect pregnancy Will order formal ultrasound D/w dr Debroah Loop at [facility] Suspicious this is hyperemesis gravidarum If no complication by [place], she can be admitted to [facility] 7:25 AM D/w internal medicine resident Will admit to medical service for rehydration  BP 128/96  Pulse 126  Temp(Src) 98.8 F (37.1 C) (Oral)  Resp 24  SpO2 99%  LMP 07/01/2013   Labs Review Labs Reviewed  CBC WITH DIFFERENTIAL - Abnormal; Notable for the following:    MCHC 36.5 (*)    All other components  within normal limits  COMPREHENSIVE METABOLIC PANEL - Abnormal; Notable for the following:    Potassium 2.9 (*)    CO2 18 (*)    Glucose, Bld 139 (*)    Total Protein 8.5 (*)    GFR calc non Af Amer 82 (*)    All other components within normal limits  URINALYSIS, ROUTINE W REFLEX MICROSCOPIC - Abnormal; Notable for the following:    APPearance TURBID (*)    Bilirubin Urine SMALL (*)    Ketones, ur 15 (*)    Protein, ur 100 (*)     Leukocytes, UA SMALL (*)    All other components within normal limits  URINE MICROSCOPIC-ADD ON - Abnormal; Notable for the following:    Squamous Epithelial / LPF MANY (*)    Bacteria, UA FEW (*)    Casts HYALINE CASTS (*)    All other components within normal limits  POC URINE PREG, ED - Abnormal; Notable for the following:    Preg Test, Ur POSITIVE (*)    All other components within normal limits  LIPASE, BLOOD  HCG, QUANTITATIVE, PREGNANCY    Imaging Review [place] Ob Comp Less 14 Wks  08/08/2013   CLINICAL DATA:  Emesis and weakness  EXAM: OBSTETRIC <14 WK ULTRASOUND  TECHNIQUE: Transabdominal ultrasound was performed for evaluation of the gestation as well as the maternal uterus and adnexal regions.  COMPARISON:  None.  FINDINGS: Patient refused transvaginal evaluation.  Intrauterine gestational sac: Visualized/normal in shape.  Yolk sac:  Not visible  Embryo:  Not visible  MSD: 7.6  mm   5 w   4  d                US EDC: 04/06/2014  Maternal uterus/adnexae: Symmetrically size and normal appearing ovaries. Dominant follicle on the right, 2 cm in diameter. No adnexal mass. No free pelvic fluid.  IMPRESSION: Single intrauterine gestation, estimated age 66 weeks 4 days. As expected, no yolk sac or fetal pole yet visualized. Recommend follow-up quantitative B-HCG levels and follow-up US in 14 days to confirm and assess viability. This recommendation follows SRU consensus guidelines: Diagnostic Criteria for Nonviable Pregnancy Early in the First Trimester. Malva Limes Engl J Med 2013; 161:0960-45; 369:1443-51.   Electronically Signed   By: Tiburcio PeaJonathan  Watts M.D.   On: 08/08/2013 07:11     Date: 08/08/2013  Rate: 117  Rhythm: sinus tachycardia  QRS Axis: normal  Intervals: normal  ST/T Wave abnormalities: nonspecific T wave changes  Conduction Disutrbances:none     MDM   Final diagnoses:  Pregnancy  Hypokalemia  Dehydration  Hyperemesis gravidarum    Nursing notes including past medical history and social  history reviewed and considered in documentation xrays reviewed and considered Labs/vital reviewed and considered     Joya Gaskinsonald W Paolina Karwowski, MD 08/08/13 [phone]

## 2013-08-08 NOTE — ED Provider Notes (Signed)
Internal medicine would prefer another service to admit Family medicine is not accepting new patients this morning Call placed to [facility] for admission  Joya Gaskins, MD 08/08/13 [phone]

## 2013-08-08 NOTE — ED Provider Notes (Signed)
0800 - Care from Dr. Bebe Shaggy. [facility] will not accept patient unless OB evaluates her. OB consulted, will take patient to [facility]. Accepted by Dr. Shawnie Pons.  1. Pregnancy   2. Hypokalemia   3. Dehydration   4. Hyperemesis gravidarum      Dagmar Hait, MD 08/08/13 [phone]

## 2013-08-09 LAB — BASIC METABOLIC PANEL WITH GFR
BUN: 9 mg/dL (ref 6–23)
CO2: 21 meq/L (ref 19–32)
Calcium: 9.3 mg/dL (ref 8.4–10.5)
Chloride: 108 meq/L (ref 96–112)
Creatinine, Ser: 0.73 mg/dL (ref 0.50–1.10)
GFR calc Af Amer: >90 mL/min
GFR calc non Af Amer: >90 mL/min
Glucose, Bld: 127 mg/dL — ABNORMAL HIGH (ref 70–99)
Potassium: 3.6 meq/L — ABNORMAL LOW (ref 3.7–5.3)
Sodium: 141 meq/L (ref 137–147)

## 2013-08-09 NOTE — Progress Notes (Signed)
INITIAL NUTRITION ASSESSMENT  DOCUMENTATION CODES Per approved criteria  -Not Applicable   INTERVENTION: C/L diet Advance as tol to regular with snacks TID Consider Resource Supplement TID, if n/v resolve  NUTRITION DIAGNOSIS: Inadequate oral intake related to hyperemesis  as evidenced by n/v.   Goal: tol of po diet  Monitor:  Diet tol  Reason for Assessment: Hyperemesis adm  33 y.o. female  Admitting Dx: Hyperemesis complicating pregnancy, antepartum  ASSESSMENT: Pt reports several week Hx of n/v but no weight loss. Reported to me that she was still vomiting C/L diet  this AM Wants to only consume chicken broth and ginger ale.  Height: Ht Readings from Last 1 Encounters:  No data found for Ht    Weight: Wt Readings from Last 1 Encounters:  08/09/13 184 lb 8 oz (83.689 kg)    Ideal Body Weight: 115 lbs  % Ideal Body Weight: 160%  Wt Readings from Last 10 Encounters:  08/09/13 184 lb 8 oz (83.689 kg)    Usual Body Weight: 185 lbs, per pt  % Usual Body Weight: 100%  BMI:  There is no height on file to calculate BMI.  Estimated Nutritional Needs: Kcal: 1600-1800 Protein: 67-77 g Fluid: 2 L   Diet Order: Clear Liquid  EDUCATION NEEDS: -Education needs addressed. Pt was provided with a copy of AND " Diet for Morning sickness". Reviewed basics of diet, pt not interested in more detailed explaination    Intake/Output Summary (Last 24 hours) at 08/09/13 1216 Last data filed at 08/09/13 1100  Gross per 24 hour  Intake 3188.02 ml  Output    850 ml  Net 2338.02 ml    Labs:   Recent Labs Lab 08/08/13 0455 08/09/13 0520  NA 138 141  K 2.9* 3.6*  CL 99 108  CO2 18* 21  BUN 12 9  CREATININE 0.92 0.73  CALCIUM 10.4 9.3  GLUCOSE 139* 127*    CBG (last 3)  No results found for this basename: GLUCAP,  in the last 72 hours  Scheduled Meds: . famotidine (PEPCID) IV  20 mg Intravenous Q12H    Continuous Infusions: . dextrose 5 % and 0.45 %  NaCl with KCl 40 mEq/L 125 mL/hr at 08/09/13 0702    Past Medical History  Diagnosis Date  . GERD (gastroesophageal reflux disease)   . Acid reflux   . Hypertension     History reviewed. No pertinent past surgical history.  Elisabeth Cara M.Odis Luster LDN Neonatal Nutrition Support Specialist Pager [phone]

## 2013-08-09 NOTE — Progress Notes (Signed)
Patient ID: [name], female   DOB: 11/06/1980, 33 y.o.   MRN: [id number] [name] very early pregnant 2175w5d by gestational sac measurement (will need sonogram in 1 week for pregnancy progression evaluation) History of cyclical nausea vomiting when she was abusing alcohol, pt states has not had any alcohol in about 2 months Has had GI evaluation in past revealing gastritis  Currently this am feels better only thrown up 1 time since last night Wants chicken broth this am and i suggested ginger ale as well  Abdomen soft nontender  Results for orders placed during the hospital encounter of 08/08/13 (from the past 24 hour(s))  URINE RAPID DRUG SCREEN (HOSP PERFORMED)   Collection Time    08/08/13  1:30 PM      Result Value Ref Range   Opiates NONE DETECTED  NONE DETECTED   Cocaine NONE DETECTED  NONE DETECTED   Benzodiazepines NONE DETECTED  NONE DETECTED   Amphetamines NONE DETECTED  NONE DETECTED   Tetrahydrocannabinol POSITIVE (*) NONE DETECTED   Barbiturates NONE DETECTED  NONE DETECTED  TSH   Collection Time    08/08/13  3:30 PM      Result Value Ref Range   TSH 0.520  0.350 - 4.500 uIU/mL  T3, FREE   Collection Time    08/08/13  3:30 PM      Result Value Ref Range   T3, Free 3.0  2.3 - 4.2 pg/mL  T4, FREE   Collection Time    08/08/13  3:30 PM      Result Value Ref Range   Free T4 1.37  0.80 - 1.80 ng/dL  GAMMA GT   Collection Time    08/08/13  3:30 PM      Result Value Ref Range   GGT 46  7 - 51 U/L  URINALYSIS, ROUTINE W REFLEX MICROSCOPIC   Collection Time    08/08/13  3:31 PM      Result Value Ref Range   Color, Urine YELLOW  YELLOW   APPearance HAZY (*) CLEAR   Specific Gravity, Urine >1.030 (*) 1.005 - 1.030   pH 6.0  5.0 - 8.0   Glucose, UA NEGATIVE  NEGATIVE mg/dL   Hgb urine dipstick NEGATIVE  NEGATIVE   Bilirubin Urine SMALL (*) NEGATIVE   Ketones, ur >80 (*) NEGATIVE mg/dL   Protein, ur 409100 (*) NEGATIVE mg/dL   Urobilinogen, UA 1.0  0.0 - 1.0 mg/dL   Nitrite NEGATIVE  NEGATIVE   Leukocytes, UA NEGATIVE  NEGATIVE  URINE MICROSCOPIC-ADD ON   Collection Time    08/08/13  3:31 PM      Result Value Ref Range   Squamous Epithelial / LPF MANY (*) RARE   WBC, UA 0-2  <3 WBC/hpf   Bacteria, UA FEW (*) RARE   Urine-Other MUCOUS PRESENT    BASIC METABOLIC PANEL   Collection Time    08/09/13  5:20 AM      Result Value Ref Range   Sodium 141  137 - 147 mEq/L   Potassium 3.6 (*) 3.7 - 5.3 mEq/L   Chloride 108  96 - 112 mEq/L   CO2 21  19 - 32 mEq/L   Glucose, Bld 127 (*) 70 - 99 mg/dL   BUN 9  6 - 23 mg/dL   Creatinine, Ser 8.110.73  0.50 - 1.10 mg/dL   Calcium 9.3  8.4 - 91.410.5 mg/dL   GFR calc non Af Amer >90  >90 mL/min  GFR calc Af Amer >90  >90 mL/min    Impression Cyclical nausea vomting vs hyperemesis in a very early pregnancy  Plan Supportive measures, hydration, advance diet  Evaluate pregnancy status next week as outpatient hopefully

## 2013-08-10 DIAGNOSIS — O210 Mild hyperemesis gravidarum: Secondary | ICD-10-CM

## 2013-08-10 LAB — BASIC METABOLIC PANEL
BUN: 3 mg/dL — ABNORMAL LOW (ref 6–23)
CHLORIDE: 103 meq/L (ref 96–112)
CO2: 25 meq/L (ref 19–32)
CREATININE: 0.73 mg/dL (ref 0.50–1.10)
Calcium: 9.4 mg/dL (ref 8.4–10.5)
GFR calc Af Amer: >90 mL/min (ref >90)
GFR calc non Af Amer: >90 mL/min (ref >90)
Glucose, Bld: 125 mg/dL — ABNORMAL HIGH (ref 70–99)
Potassium: 3.6 mEq/L — ABNORMAL LOW (ref 3.7–5.3)
Sodium: 137 mEq/L (ref 137–147)

## 2013-08-10 LAB — AMYLASE: Amylase: 61 U/L (ref 0–105)

## 2013-08-10 LAB — LIPASE, BLOOD: Lipase: 32 U/L (ref 11–59)

## 2013-08-10 MED ORDER — SODIUM CHLORIDE 0.9 % IV SOLN
25.0000 mg | Freq: Once | INTRAVENOUS | Status: AC
  Administered 2013-08-10: 25 mg via INTRAVENOUS
  Filled 2013-08-10: qty 1

## 2013-08-10 MED ORDER — SODIUM CHLORIDE 0.9 % IV SOLN
25.0000 mg | INTRAVENOUS | Status: DC
  Administered 2013-08-10: 25 mg via INTRAVENOUS
  Filled 2013-08-10: qty 1

## 2013-08-10 MED ORDER — PYRIDOXINE HCL 100 MG/ML IJ SOLN
100.0000 mg | Freq: Every day | INTRAMUSCULAR | Status: DC
  Filled 2013-08-10 (×3): qty 1

## 2013-08-10 NOTE — Progress Notes (Addendum)
Subjective: Patient reports nausea and vomiting.  She states the nausea is worst with motion. She has not tried her clear diet, stating that she was not aware that she was allowed to eat. She denies cramping or vaginal bleeding. Patient denies chronic usage of THC, reports last use was a week ago. She did not comment on her alcohol usage.  Objective: I have reviewed patient's vital signs, intake and output and medications.  General: alert, cooperative and no distress Resp: clear to auscultation bilaterally Cardio: regular rate and rhythm Extremities: extremities normal, atraumatic, no cyanosis or edema   Assessment/Plan: 33 yo G4P2012 in early pregnancy with nausea and emesis - Encouraged trial of clear liquids - Will add phenergan to IV fluids and start vitamin B6 - Given h/o alcohol abuse, will check amylase and lipase - Continue supportive care    LOS: 2 days    [name] 08/10/2013, 6:37 AM

## 2013-08-11 DIAGNOSIS — O211 Hyperemesis gravidarum with metabolic disturbance: Principal | ICD-10-CM

## 2013-08-11 LAB — BASIC METABOLIC PANEL
BUN: 5 mg/dL — ABNORMAL LOW (ref 6–23)
CALCIUM: 9.5 mg/dL (ref 8.4–10.5)
CO2: 24 meq/L (ref 19–32)
Chloride: 102 mEq/L (ref 96–112)
Creatinine, Ser: 0.69 mg/dL (ref 0.50–1.10)
GFR calc Af Amer: >90 mL/min (ref >90)
GFR calc non Af Amer: >90 mL/min (ref >90)
GLUCOSE: 101 mg/dL — AB (ref 70–99)
POTASSIUM: 3.5 meq/L — AB (ref 3.7–5.3)
Sodium: 138 mEq/L (ref 137–147)

## 2013-08-11 LAB — HCG, QUANTITATIVE, PREGNANCY: hCG, Beta Chain, Quant, S: 10495 m[IU]/mL — ABNORMAL HIGH (ref <5)

## 2013-08-11 MED ORDER — OMEPRAZOLE 40 MG PO CPDR: 40.0000 mg | DELAYED_RELEASE_CAPSULE | Freq: Every day | ORAL | Status: DC

## 2013-08-11 MED ORDER — PROMETHAZINE HCL 25 MG PO TABS: 25.0000 mg | ORAL_TABLET | Freq: Four times a day (QID) | ORAL | Status: DC | PRN

## 2013-08-11 MED ORDER — VITAMIN B-6 100 MG PO TABS: 100.0000 mg | ORAL_TABLET | Freq: Two times a day (BID) | ORAL | Status: DC

## 2013-08-11 MED ORDER — ONDANSETRON HCL 4 MG PO TABS: 4.0000 mg | ORAL_TABLET | Freq: Four times a day (QID) | ORAL | Status: DC | PRN

## 2013-08-11 NOTE — Discharge Summary (Signed)
Attestation of Attending Supervision of Fellow: Evaluation and management procedures were performed by the Fellow under my supervision and collaboration.  I have reviewed the Fellow's note and chart, and I agree with the management and plan.    

## 2013-08-11 NOTE — Progress Notes (Signed)
Pt d/c home, via wc with taxi [facility]), stable to private home. D/c instructions and prescriptions reviewed with patient. Instructed the pt to contact [facility]. Patient, states while RN is educating all of that information was reviewed with her by the doctor and she has no questions. States she is ready to go home and wants the RX called into the [facility] [address]. Instructed the patient they would called in, states she will get with her mother on transportation and co-pay cost. No further questions for nurse.

## 2013-08-11 NOTE — Discharge Summary (Signed)
Physician Discharge Summary  Patient ID: [name] MRN: [id number] DOB/AGE: Apr 16, 1980 32 y.o.  Admit date: 08/08/2013 Discharge date: 08/11/2013  Admission Diagnoses: intractable nausea and vomiting, early pregnancy with question of hyperemesis.   Discharge Diagnoses:  Principal Problem:   Hyperemesis complicating pregnancy, antepartum Active Problems:   Dehydration   Hypokalemia   Essential hypertension, benign   History of conization of cervix complicating pregnancy, antepartum   History of alcohol use   H/O alcoholic gastritis   Discharged Condition: good  Hospital Course: For complete details of admission please see H&P. Pt presented on 6/3 for nausea, vomiting and found to by hypokalemic. An US revealed her to be approx [redacted]w[redacted]d with an intrauterine growth sac but no yolk sac or fetal pole.  Given her pregnancy, she was sent here for admission for suspected hyperemesis gravidarum. She was rehydrated, her potassium was repleted and her nausea was controlled with phenergan and zofran as well as pepcid.  On hospital day 3 she was tolerating a diet and without nausea and the decision was made to send her home.  She was discharged on a PPI, zofran and phenergan and had a quant done prior to d/c that showed an appropriate rise for an early IUP.  She is new to the area and as such given the number to call and set up an appointment at the health department for prenatal care.    Significant Diagnostic Studies:  TVUS  Treatments: IV hydration and antiemetics and potassium   Discharge Exam: Blood pressure 131/87, pulse 85, temperature 98 F (36.7 C), temperature source Oral, resp. rate 16, weight 84.369 kg (186 lb), last menstrual period 07/01/2013, SpO2 96.00%. GENERAL: Well-developed, well-nourished female in no acute distress.  HEENT: Normocephalic, atraumatic. Sclerae anicteric.  NECK: Supple.  LUNGS: Clear to auscultation bilaterally.  HEART: Regular rate and  rhythm. ABDOMEN: Soft, nontender, nondistended. No organomegaly. EXTREMITIES: No cyanosis, clubbing, or edema, 2+ distal pulses.  Disposition:   Discharge Instructions   Discharge patient    Complete by:  As directed   To home     Discharge patient    Complete by:  As directed   To home            Medication List         omeprazole 40 MG capsule  Commonly known as:  PRILOSEC  Take 1 capsule (40 mg total) by mouth daily.     ondansetron 4 MG tablet  Commonly known as:  ZOFRAN  Take 1 tablet (4 mg total) by mouth every 6 (six) hours as needed for nausea.     promethazine 25 MG tablet  Commonly known as:  PHENERGAN  Take 1 tablet (25 mg total) by mouth every 6 (six) hours as needed for nausea or vomiting.     pyridOXINE 100 MG tablet  Commonly known as:  VITAMIN B-6  Take 1 tablet (100 mg total) by mouth 2 (two) times daily.           Follow-up Information   Schedule an appointment as soon as possible for a visit with [facility]. (to start prenatal care)    Contact information:   [address] [place] [address] [phone]      Signed: [place] Haven 08/11/2013, 10:15 AM

## 2013-08-11 NOTE — Discharge Instructions (Signed)
Hyperemesis Gravidarum Diet °Hyperemesis gravidarum is a severe form of morning sickness. It is characterized by frequent and severe vomiting. It happens during the first trimester of pregnancy. It may be caused by the rapid hormone changes that happen during pregnancy. It is associated with a 5% weight loss of pre-pregnancy weight. The hyperemesis diet may be used to lessen symptoms of nausea and vomiting. °EATING GUIDELINES °· Eat 5 to 6 small meals daily instead of 3 large meals. °· Avoid foods with strong smells. °· Avoid drinking 30 minutes before and after meals. °· Avoid fried or high-fat foods, such as butter and cream sauces. °· Starchy foods are usually well-tolerated, such as cereal, toast, bread, potatoes, pasta, rice, and pretzels. °· Eat crackers before you get out of bed in the morning. °· Avoid spicy foods. °· Ginger may help with nausea. Add ¼ tsp ginger to hot tea or choose ginger tea. °· Continue to take your prenatal vitamins as directed by your caregiver. °SAMPLE MEAL PLAN °Breakfast  °· ½ cup oatmeal °· 1 slice toast °· 1 tsp heart-healthy margarine °· 1 tsp jelly °· 1 scrambled egg °Midmorning Snack  °· 1 cup low-fat yogurt °Lunch  °· Plain ham sandwich °· Carrot or celery sticks °· 1 small apple °· 3 graham crackers °Midafternoon Snack  °· Cheese and crackers °Dinner °· 4 oz pork tenderloin °· 1 small baked potato °· 1 tsp margarine °· ½ cup broccoli °· ½ cup grapes °Evening Snack °· 1 cup pudding °Document Released: 12/20/2006 Document Revised: 05/17/2011 Document Reviewed: 07/25/2012 °[employer]® Patient Information ©2014 [employer]. ° °

## 2013-10-08 LAB — CULTURE, OB URINE: Urine Culture, OB: NEGATIVE

## 2013-10-08 LAB — OB RESULTS CONSOLE HEPATITIS B SURFACE ANTIGEN: Hepatitis B Surface Ag: NEGATIVE

## 2013-10-08 LAB — CYTOLOGY - PAP

## 2013-10-08 LAB — OB RESULTS CONSOLE PLATELET COUNT: PLATELETS: 294 10*3/uL

## 2013-10-08 LAB — OB RESULTS CONSOLE GC/CHLAMYDIA
Chlamydia: NEGATIVE
Gonorrhea: NEGATIVE

## 2013-10-08 LAB — OB RESULTS CONSOLE RUBELLA ANTIBODY, IGM: Rubella: IMMUNE

## 2013-10-08 LAB — SICKLE CELL SCREEN: Sickle Cell Screen: NORMAL

## 2013-10-08 LAB — OB RESULTS CONSOLE HGB/HCT, BLOOD
HCT: 30 %
Hemoglobin: 9.7 g/dL

## 2013-10-08 LAB — OB RESULTS CONSOLE ABO/RH: RH TYPE: POSITIVE

## 2013-10-08 LAB — GLUCOSE TOLERANCE, 1 HOUR (50G) W/O FASTING: GLUCOSE 1 HOUR GTT: 102

## 2013-10-08 LAB — OB RESULTS CONSOLE VARICELLA ZOSTER ANTIBODY, IGG: Varicella: IMMUNE

## 2013-10-08 LAB — CYSTIC FIBROSIS DIAGNOSTIC STUDY: INTERPRETATION-CFDNA: NEGATIVE

## 2013-10-08 LAB — OB RESULTS CONSOLE ANTIBODY SCREEN: ANTIBODY SCREEN: NEGATIVE

## 2013-10-08 LAB — OB RESULTS CONSOLE RPR: RPR: NONREACTIVE

## 2013-10-09 LAB — OB RESULTS CONSOLE HIV ANTIBODY (ROUTINE TESTING): HIV: NONREACTIVE

## 2013-10-23 DIAGNOSIS — F1990 Other psychoactive substance use, unspecified, uncomplicated: Secondary | ICD-10-CM

## 2013-10-23 DIAGNOSIS — O09219 Supervision of pregnancy with history of pre-term labor, unspecified trimester: Secondary | ICD-10-CM | POA: Insufficient documentation

## 2013-10-23 DIAGNOSIS — Z8659 Personal history of other mental and behavioral disorders: Secondary | ICD-10-CM

## 2013-10-23 DIAGNOSIS — O09299 Supervision of pregnancy with other poor reproductive or obstetric history, unspecified trimester: Secondary | ICD-10-CM | POA: Insufficient documentation

## 2013-10-23 DIAGNOSIS — R87612 Low grade squamous intraepithelial lesion on cytologic smear of cervix (LGSIL): Secondary | ICD-10-CM

## 2013-10-23 DIAGNOSIS — R87619 Unspecified abnormal cytological findings in specimens from cervix uteri: Secondary | ICD-10-CM | POA: Insufficient documentation

## 2013-10-23 DIAGNOSIS — O210 Mild hyperemesis gravidarum: Secondary | ICD-10-CM

## 2013-10-23 DIAGNOSIS — O139 Gestational [pregnancy-induced] hypertension without significant proteinuria, unspecified trimester: Secondary | ICD-10-CM

## 2013-10-23 DIAGNOSIS — Z915 Personal history of self-harm: Secondary | ICD-10-CM | POA: Insufficient documentation

## 2013-10-23 DIAGNOSIS — O10919 Unspecified pre-existing hypertension complicating pregnancy, unspecified trimester: Secondary | ICD-10-CM | POA: Insufficient documentation

## 2013-10-23 DIAGNOSIS — O09212 Supervision of pregnancy with history of pre-term labor, second trimester: Secondary | ICD-10-CM

## 2013-10-23 DIAGNOSIS — Z9151 Personal history of suicidal behavior: Secondary | ICD-10-CM

## 2013-10-23 DIAGNOSIS — Z9889 Other specified postprocedural states: Secondary | ICD-10-CM

## 2013-10-25 ENCOUNTER — Encounter: Payer: Self-pay | Admitting: Obstetrics & Gynecology

## 2013-10-25 ENCOUNTER — Encounter: Payer: Self-pay | Admitting: *Deleted

## 2013-10-25 ENCOUNTER — Ambulatory Visit (INDEPENDENT_AMBULATORY_CARE_PROVIDER_SITE_OTHER): Payer: Medicaid Other | Admitting: Obstetrics & Gynecology

## 2013-10-25 VITALS — BP 108/70 | HR 92 | Temp 98.3°F | Ht 63.0 in | Wt 192.0 lb

## 2013-10-25 DIAGNOSIS — O3443 Maternal care for other abnormalities of cervix, third trimester: Secondary | ICD-10-CM

## 2013-10-25 DIAGNOSIS — O10912 Unspecified pre-existing hypertension complicating pregnancy, second trimester: Secondary | ICD-10-CM

## 2013-10-25 DIAGNOSIS — O99619 Diseases of the digestive system complicating pregnancy, unspecified trimester: Secondary | ICD-10-CM

## 2013-10-25 DIAGNOSIS — O99612 Diseases of the digestive system complicating pregnancy, second trimester: Secondary | ICD-10-CM

## 2013-10-25 DIAGNOSIS — O3440 Maternal care for other abnormalities of cervix, unspecified trimester: Secondary | ICD-10-CM

## 2013-10-25 DIAGNOSIS — O09292 Supervision of pregnancy with other poor reproductive or obstetric history, second trimester: Secondary | ICD-10-CM

## 2013-10-25 DIAGNOSIS — Z9889 Other specified postprocedural states: Secondary | ICD-10-CM

## 2013-10-25 DIAGNOSIS — O09213 Supervision of pregnancy with history of pre-term labor, third trimester: Secondary | ICD-10-CM

## 2013-10-25 DIAGNOSIS — K219 Gastro-esophageal reflux disease without esophagitis: Secondary | ICD-10-CM | POA: Insufficient documentation

## 2013-10-25 DIAGNOSIS — I10 Essential (primary) hypertension: Secondary | ICD-10-CM

## 2013-10-25 DIAGNOSIS — O99340 Other mental disorders complicating pregnancy, unspecified trimester: Secondary | ICD-10-CM

## 2013-10-25 DIAGNOSIS — F3289 Other specified depressive episodes: Secondary | ICD-10-CM

## 2013-10-25 DIAGNOSIS — O210 Mild hyperemesis gravidarum: Secondary | ICD-10-CM

## 2013-10-25 DIAGNOSIS — O10019 Pre-existing essential hypertension complicating pregnancy, unspecified trimester: Secondary | ICD-10-CM

## 2013-10-25 DIAGNOSIS — O9989 Other specified diseases and conditions complicating pregnancy, childbirth and the puerperium: Secondary | ICD-10-CM

## 2013-10-25 DIAGNOSIS — O09299 Supervision of pregnancy with other poor reproductive or obstetric history, unspecified trimester: Secondary | ICD-10-CM

## 2013-10-25 DIAGNOSIS — F329 Major depressive disorder, single episode, unspecified: Secondary | ICD-10-CM | POA: Insufficient documentation

## 2013-10-25 DIAGNOSIS — F1990 Other psychoactive substance use, unspecified, uncomplicated: Secondary | ICD-10-CM

## 2013-10-25 DIAGNOSIS — F1910 Other psychoactive substance abuse, uncomplicated: Secondary | ICD-10-CM

## 2013-10-25 DIAGNOSIS — O09219 Supervision of pregnancy with history of pre-term labor, unspecified trimester: Secondary | ICD-10-CM

## 2013-10-25 LAB — COMPREHENSIVE METABOLIC PANEL
ALK PHOS: 63 U/L (ref 39–117)
ALT: 24 U/L (ref 0–35)
AST: 19 U/L (ref 0–37)
Albumin: 4.2 g/dL (ref 3.5–5.2)
BILIRUBIN TOTAL: 0.2 mg/dL (ref 0.2–1.2)
BUN: 13 mg/dL (ref 6–23)
CO2: 21 mEq/L (ref 19–32)
Calcium: 9.9 mg/dL (ref 8.4–10.5)
Chloride: 103 mEq/L (ref 96–112)
Creat: 0.54 mg/dL (ref 0.50–1.10)
GLUCOSE: 85 mg/dL (ref 70–99)
Potassium: 3.8 mEq/L (ref 3.5–5.3)
Sodium: 135 mEq/L (ref 135–145)
Total Protein: 7.4 g/dL (ref 6.0–8.3)

## 2013-10-25 LAB — POCT URINALYSIS DIP (DEVICE)
Bilirubin Urine: NEGATIVE
Glucose, UA: NEGATIVE mg/dL
HGB URINE DIPSTICK: NEGATIVE
Ketones, ur: NEGATIVE mg/dL
Leukocytes, UA: NEGATIVE
Nitrite: NEGATIVE
PH: 5.5 (ref 5.0–8.0)
Protein, ur: 30 mg/dL — AB
Specific Gravity, Urine: 1.025 (ref 1.005–1.030)
Urobilinogen, UA: 1 mg/dL (ref 0.0–1.0)

## 2013-10-25 LAB — CBC
HCT: 31.5 % — ABNORMAL LOW (ref 36.0–46.0)
Hemoglobin: 10.6 g/dL — ABNORMAL LOW (ref 12.0–15.0)
MCH: 29.7 pg (ref 26.0–34.0)
MCHC: 33.7 g/dL (ref 30.0–36.0)
MCV: 88.2 fL (ref 78.0–100.0)
PLATELETS: 324 10*3/uL (ref 150–400)
RBC: 3.57 MIL/uL — ABNORMAL LOW (ref 3.87–5.11)
RDW: 14.3 % (ref 11.5–15.5)
WBC: 7.9 10*3/uL (ref 4.0–10.5)

## 2013-10-25 MED ORDER — SERTRALINE HCL 100 MG PO TABS: 100.0000 mg | ORAL_TABLET | Freq: Every day | ORAL | Status: DC

## 2013-10-25 MED ORDER — RANITIDINE HCL 150 MG PO TABS: 150.0000 mg | ORAL_TABLET | Freq: Two times a day (BID) | ORAL | Status: DC

## 2013-10-25 NOTE — Progress Notes (Signed)
C/o frequent heartburn-- relieved by Zantac-- states prilosec does not work at all.  Reports occasional edema in feet.  [facility] to date on lab work.

## 2013-10-25 NOTE — Progress Notes (Signed)
[facility] with [facility] -- see problem list #CHTN/History of preeclampsia in previous pregnancies: Was induced at 36 weeks according to patient due to preeclampsia, no spontaneous labor.  Discussed implications of CHTN in pregnancy, need for antenatal testing and frequent ultrasounds/prenatal visits, need for optimizing BP control to decrease CHTN/preeclampsia associated maternal-fetal morbidity and mortality. Will check baseline labs today.   #History of PTD at 36 weeks: Due to IOL for preeclampsia,not a candidate for 17P.   #[facility] CIN[facility]: Confirmed by CareEverywhere review. Will get ultrasound for cervical length ASAP and every two weeks until 28 weeks. #LGSIL pap this pregnancy: Will get colposcopy at next visit #Substance use: Patient admits to alcohol and marijuana use; no alcohol since she found out she was pregnant. Occasional THC. Had alcohol gastritis in past. Will get UDS today.   #History of suicide attempt in 2005: Denies any current HI/SI but feels depressed. Wants prescription for depression, was on Celexa. Recommended Zoloft, discussed rare risk of PPHN.  Patient wants to try Zoloft, will also talk to SW. Zoloft 100 mg po bid prescribed #GERD: Desires Zantac, this was prescribed #Routine prenatal care: Quad screen to be ordered today. Will also schedule anatomy scan. Routine obstetric precautions reviewed.  Return in in 4 weeks.

## 2013-10-25 NOTE — Patient Instructions (Signed)
Return to clinic for any obstetric concerns or go to [facility] for evaluation  

## 2013-10-25 NOTE — Progress Notes (Signed)
Ultrasound for cervical length scheduled for first available appt 10/31/13 @ 315p.  Ultrasound for cervical length and anatomy scheduled 11/14/13 @ 11a.

## 2013-10-26 LAB — AFP, QUAD SCREEN
AFP: 31 IU/mL
Curr Gest Age: 16.5 wks.days
Down Syndrome Scr Risk Est: 1:888 {titer}
HCG TOTAL: 17574 m[IU]/mL
INH: 436.1 pg/mL
INTERPRETATION-AFP: NEGATIVE
MOM FOR AFP: 1.15
MoM for INH: 2.68
MoM for hCG: 0.98
OPEN SPINA BIFIDA: NEGATIVE
Osb Risk: 1:8170 {titer}
TRI 18 SCR RISK EST: NEGATIVE
Trisomy 18 (Edward) Syndrome Interp.: 1:45300 {titer}
UE3 MOM: 1.04
uE3 Value: 0.6 ng/mL

## 2013-10-26 LAB — PROTEIN / CREATININE RATIO, URINE
Creatinine, Urine: 293.5 mg/dL
Protein Creatinine Ratio: 0.05 (ref <0.15)
TOTAL PROTEIN, URINE: 16 mg/dL

## 2013-10-26 LAB — ALCOHOL METABOLITE (ETG), URINE: Ethyl Glucuronide (EtG): NEGATIVE ng/mL

## 2013-10-29 ENCOUNTER — Emergency Department (HOSPITAL_COMMUNITY)
Admission: AD | Admit: 2013-10-29 | Discharge: 2013-10-29 | Disposition: A | Discharge status: Home / Self Care | Payer: Medicaid Other | Source: Ambulatory Visit | Attending: Obstetrics and Gynecology | Admitting: Obstetrics and Gynecology

## 2013-10-29 ENCOUNTER — Inpatient Hospital Stay (EMERGENCY_DEPARTMENT_HOSPITAL)
Admission: AD | Admit: 2013-10-29 | Discharge: 2013-10-30 | Disposition: A | Discharge status: Home / Self Care | Payer: Medicaid Other | Source: Ambulatory Visit | Attending: Obstetrics and Gynecology | Admitting: Obstetrics and Gynecology

## 2013-10-29 ENCOUNTER — Encounter: Payer: Self-pay | Admitting: Obstetrics & Gynecology

## 2013-10-29 ENCOUNTER — Encounter (HOSPITAL_COMMUNITY): Payer: Self-pay | Admitting: *Deleted

## 2013-10-29 ENCOUNTER — Encounter (HOSPITAL_COMMUNITY): Payer: Self-pay | Admitting: Emergency Medicine

## 2013-10-29 DIAGNOSIS — F3289 Other specified depressive episodes: Secondary | ICD-10-CM | POA: Insufficient documentation

## 2013-10-29 DIAGNOSIS — O9989 Other specified diseases and conditions complicating pregnancy, childbirth and the puerperium: Secondary | ICD-10-CM | POA: Insufficient documentation

## 2013-10-29 DIAGNOSIS — O99340 Other mental disorders complicating pregnancy, unspecified trimester: Secondary | ICD-10-CM | POA: Insufficient documentation

## 2013-10-29 DIAGNOSIS — Z79899 Other long term (current) drug therapy: Secondary | ICD-10-CM | POA: Insufficient documentation

## 2013-10-29 DIAGNOSIS — F329 Major depressive disorder, single episode, unspecified: Secondary | ICD-10-CM | POA: Diagnosis not present

## 2013-10-29 DIAGNOSIS — E860 Dehydration: Secondary | ICD-10-CM

## 2013-10-29 DIAGNOSIS — K219 Gastro-esophageal reflux disease without esophagitis: Secondary | ICD-10-CM | POA: Insufficient documentation

## 2013-10-29 DIAGNOSIS — O99019 Anemia complicating pregnancy, unspecified trimester: Secondary | ICD-10-CM | POA: Insufficient documentation

## 2013-10-29 DIAGNOSIS — N390 Urinary tract infection, site not specified: Secondary | ICD-10-CM

## 2013-10-29 DIAGNOSIS — O211 Hyperemesis gravidarum with metabolic disturbance: Secondary | ICD-10-CM | POA: Insufficient documentation

## 2013-10-29 DIAGNOSIS — O169 Unspecified maternal hypertension, unspecified trimester: Secondary | ICD-10-CM | POA: Insufficient documentation

## 2013-10-29 DIAGNOSIS — D649 Anemia, unspecified: Secondary | ICD-10-CM | POA: Insufficient documentation

## 2013-10-29 DIAGNOSIS — O210 Mild hyperemesis gravidarum: Secondary | ICD-10-CM

## 2013-10-29 DIAGNOSIS — O2390 Unspecified genitourinary tract infection in pregnancy, unspecified trimester: Secondary | ICD-10-CM | POA: Diagnosis not present

## 2013-10-29 DIAGNOSIS — O219 Vomiting of pregnancy, unspecified: Secondary | ICD-10-CM

## 2013-10-29 LAB — URINALYSIS, ROUTINE W REFLEX MICROSCOPIC
GLUCOSE, UA: NEGATIVE mg/dL
HGB URINE DIPSTICK: NEGATIVE
Ketones, ur: >80 mg/dL — AB
Nitrite: NEGATIVE
Protein, ur: 30 mg/dL — AB
SPECIFIC GRAVITY, URINE: 1.032 — AB (ref 1.005–1.030)
UROBILINOGEN UA: 1 mg/dL (ref 0.0–1.0)
pH: 6 (ref 5.0–8.0)

## 2013-10-29 LAB — URINE MICROSCOPIC-ADD ON

## 2013-10-29 MED ORDER — CEPHALEXIN 500 MG PO CAPS
500.0000 mg | ORAL_CAPSULE | Freq: Four times a day (QID) | ORAL | Status: DC
Start: 2013-10-29 — End: 2013-11-27

## 2013-10-29 MED ORDER — PROMETHAZINE HCL 25 MG/ML IJ SOLN
12.5000 mg | Freq: Once | INTRAMUSCULAR | Status: AC
  Administered 2013-10-29: 12.5 mg via INTRAVENOUS
  Filled 2013-10-29: qty 1

## 2013-10-29 MED ORDER — SODIUM CHLORIDE 0.9 % IV BOLUS (SEPSIS): 1000.0000 mL | Freq: Once | INTRAVENOUS | Status: DC

## 2013-10-29 MED ORDER — SODIUM CHLORIDE 0.9 % IV BOLUS (SEPSIS)
1000.0000 mL | Freq: Once | INTRAVENOUS | Status: AC
  Administered 2013-10-29: 1000 mL via INTRAVENOUS

## 2013-10-29 MED ORDER — PROMETHAZINE HCL 25 MG PO TABS: 25.0000 mg | ORAL_TABLET | Freq: Four times a day (QID) | ORAL | Status: DC | PRN

## 2013-10-29 MED ORDER — SODIUM CHLORIDE 0.9 % IV SOLN
25.0000 mg | Freq: Once | INTRAVENOUS | Status: AC
  Administered 2013-10-29: 25 mg via INTRAVENOUS
  Filled 2013-10-29: qty 1

## 2013-10-29 MED ORDER — LACTATED RINGERS IV BOLUS (SEPSIS)
1000.0000 mL | Freq: Once | INTRAVENOUS | Status: AC
  Administered 2013-10-30: 1000 mL via INTRAVENOUS

## 2013-10-29 MED ORDER — DEXTROSE 5 % IV SOLN
1.0000 g | Freq: Once | INTRAVENOUS | Status: AC
  Administered 2013-10-29: 1 g via INTRAVENOUS
  Filled 2013-10-29: qty 10

## 2013-10-29 NOTE — [facility] Note (Signed)
Pt presented by EMS vomiting. Vomiting began a couple of days ago/ Pt seen at [facility] today and given prescription for antibiotics for UTI and vomiting.

## 2013-10-29 NOTE — ED Notes (Signed)
Pt. Ambulated to the bathroom, gait steady.  Pt.s mother picking up pt.

## 2013-10-29 NOTE — MAU Provider Note (Signed)
History     CSN: [id number]  Arrival date and time: 10/29/13 2204   First Provider Initiated Contact with Patient 10/29/13 2228      Chief Complaint  Patient presents with  . Morning Sickness   HPI This is a 33 y.o. female at [redacted]w[redacted]d who presents via EMS with c/o vomiting. Was seen earlier today at the other ED and given meds and fluid. Refused labs there. Urine showed dehydration.  States took Phenergan twice and vomited it. Did not take Zofran.  Seems very lethargic, won't talk unless pressured for answers. Denies substance ingestion other than Phenergan.  Denies Marijuana use today.  RN Note:  Pt presented by EMS vomiting. Vomiting began a couple of days ago/ Pt seen at [facility] today and given prescription for antibiotics for UTI and vomiting.       OB History   Grav Para Term Preterm Abortions TAB SAB Ect Mult Living   0 Past Medical History  Diagnosis Date  . GERD (gastroesophageal reflux disease)   . Acid reflux   . Hypertension   . Anemia   . Depression     Past Surgical History  Procedure Laterality Date  . Leep      Family History  Problem Relation Age of Onset  . Heart disease Mother   . Hyperlipidemia Mother   . Hypertension Mother   . Stroke Mother   . Asthma Sister   . Asthma Son     History  Substance Use Topics  . Smoking status: Never Smoker   . Smokeless tobacco: Never Used  . Alcohol Use: No    Allergies: No Known Allergies  Prescriptions prior to admission  Medication Sig Dispense Refill  . cephALEXin (KEFLEX) 500 MG capsule Take 1 capsule (500 mg total) by mouth 4 (four) times daily.  40 capsule  0  . omeprazole (PRILOSEC) 40 MG capsule Take 1 capsule (40 mg total) by mouth daily.  30 capsule  1  . ondansetron (ZOFRAN) 4 MG tablet Take 1 tablet (4 mg total) by mouth every 6 (six) hours as needed for nausea.  30 tablet  1  . promethazine (PHENERGAN) 25 MG tablet Take 1 tablet (25 mg total) by mouth every 6 (six)  hours as needed for nausea or vomiting.  10 tablet  0  . ranitidine (ZANTAC) 150 MG tablet Take 150 mg by mouth daily.      . sertraline (ZOLOFT) 100 MG tablet Take 1 tablet (100 mg total) by mouth daily.  30 tablet  3    Review of Systems  Constitutional: Positive for malaise/fatigue. Negative for fever and chills.  Gastrointestinal: Positive for nausea, vomiting and abdominal pain (cramping all over, diffuse). Negative for diarrhea and constipation.  Genitourinary: Negative for dysuria.  Neurological: Positive for weakness.   Physical Exam   Blood pressure 142/86, pulse 95, temperature 98.2 F (36.8 C), temperature source Oral, resp. rate 18, last menstrual period 07/01/2013.  Physical Exam  Constitutional: She is oriented to person, place, and time. She appears well-developed. No distress.  Very lethargic, eyes closed. Does not answer questions unless pressed. Points at counter when asked if she took meds today. Denies drug use.  HENT:  Head: Normocephalic.  Cardiovascular: Normal rate, regular rhythm and normal heart sounds.  Exam reveals no gallop and no friction rub.   No murmur heard. Respiratory: Effort normal. No respiratory distress. She has no wheezes. She  has no rales. She exhibits no tenderness.  GI: Soft. She exhibits no distension. There is no tenderness. There is no rebound and no guarding.  Musculoskeletal: Normal range of motion.  Neurological: She is oriented to person, place, and time.  Lethargic, sleepy   Skin: Skin is warm and dry.  Psychiatric:  Irritable, lethargic     MAU Course  Procedures  MDM Results for orders placed during the hospital encounter of 10/29/13 (from the past 24 hour(s))  URINE RAPID DRUG SCREEN (HOSP PERFORMED)     Status: Abnormal   Collection Time    10/30/13 12:25 AM      Result Value Ref Range   Opiates NONE DETECTED  NONE DETECTED   Cocaine NONE DETECTED  NONE DETECTED   Benzodiazepines NONE DETECTED  NONE DETECTED    Amphetamines NONE DETECTED  NONE DETECTED   Tetrahydrocannabinol POSITIVE (*) NONE DETECTED   Barbiturates NONE DETECTED  NONE DETECTED  URINALYSIS, ROUTINE W REFLEX MICROSCOPIC     Status: Abnormal   Collection Time    10/30/13 12:25 AM      Result Value Ref Range   Color, Urine YELLOW  YELLOW   APPearance HAZY (*) CLEAR   Specific Gravity, Urine >1.030 (*) 1.005 - 1.030   pH 6.0  5.0 - 8.0   Glucose, UA NEGATIVE  NEGATIVE mg/dL   Hgb urine dipstick NEGATIVE  NEGATIVE   Bilirubin Urine SMALL (*) NEGATIVE   Ketones, ur >80 (*) NEGATIVE mg/dL   Protein, ur 295 (*) NEGATIVE mg/dL   Urobilinogen, UA 0.2  0.0 - 1.0 mg/dL   Nitrite NEGATIVE  NEGATIVE   Leukocytes, UA NEGATIVE  NEGATIVE  URINE MICROSCOPIC-ADD ON     Status: Abnormal   Collection Time    10/30/13 12:25 AM      Result Value Ref Range   Squamous Epithelial / LPF MANY (*) RARE   WBC, UA 0-2  <3 WBC/hpf   RBC / HPF 0-2  <3 RBC/hpf   Bacteria, UA RARE  RARE   Urine-Other MUCOUS PRESENT    CBC     Status: Abnormal   Collection Time    10/30/13  2:15 AM      Result Value Ref Range   WBC 13.7 (*) 4.0 - 10.5 K/uL   RBC 3.34 (*) 3.87 - 5.11 MIL/uL   Hemoglobin 10.1 (*) 12.0 - 15.0 g/dL   HCT 62.1 (*) 30.8 - 65.7 %   MCV 87.4  78.0 - 100.0 fL   MCH 30.2  26.0 - 34.0 pg   MCHC 34.6  30.0 - 36.0 g/dL   RDW 84.6  96.2 - 95.2 %   Platelets 237  150 - 400 K/uL  COMPREHENSIVE METABOLIC PANEL     Status: Abnormal   Collection Time    10/30/13  2:15 AM      Result Value Ref Range   Sodium 135 (*) 137 - 147 mEq/L   Potassium 3.3 (*) 3.7 - 5.3 mEq/L   Chloride 101  96 - 112 mEq/L   CO2 17 (*) 19 - 32 mEq/L   Glucose, Bld 127 (*) 70 - 99 mg/dL   BUN 8  6 - 23 mg/dL   Creatinine, Ser 8.41 (*) 0.50 - 1.10 mg/dL   Calcium 8.9  8.4 - 32.4 mg/dL   Total Protein 7.3  6.0 - 8.3 g/dL   Albumin 3.4 (*) 3.5 - 5.2 g/dL   AST 13  0 - 37 U/L   ALT 15  0 - 35 U/L   Alkaline Phosphatase 66  39 - 117 U/L   Total Bilirubin 0.2 (*) 0.3 -  1.2 mg/dL   GFR calc non Af Amer >90  >90 mL/min   GFR calc Af Amer >90  >90 mL/min   Anion gap 17 (*) 5 - 15   Has had 2 liters of fluid. Sleeps most of the time.  States is constantly vomiting, but RN has been in there every 15 min and pt has been sleeping. Discussed with Dr Jolayne Panther. Will discharge home.   Assessment and Plan  A:  SIUP at [redacted]w[redacted]d       Hyperemsis    P:  Discharge home       Will add Rx for Phenergan suppositories       Has appt for [place] on 8/26 for cervical length [place]  [facility] 10/29/2013, 10:34 PM

## 2013-10-29 NOTE — ED Notes (Signed)
Patient arrived via [facility] from home with nausea, vomiting, diarrhea x3 days. Patient is [redacted] wks pregnant. VSS, A/O.

## 2013-10-29 NOTE — MAU Note (Signed)
Pt states that she took a Phenergan po at 1900 but then vomited.

## 2013-10-29 NOTE — ED Provider Notes (Signed)
CSN: [id number]     Arrival date & time 10/29/13  0533 History   First MD Initiated Contact with Patient 10/29/13 [phone]     Chief Complaint  Patient presents with  . Nausea  . Emesis  . Diarrhea     (Consider location/radiation/quality/duration/timing/severity/associated sxs/prior Treatment) HPI  This patient is a 33 yo woman with a 16 week IUP and history of hyperemesis gravidarum. She comes in with 2 days of nausea and vomiting with inability to tolerate po intake of fluids. She has had two episodes of nonbloody diarrhea. She denies abdominal pain, GU sx and fever. She is followed at the [facility]. She received Zofran  IV en route.   Past Medical History  Diagnosis Date  . GERD (gastroesophageal reflux disease)   . Acid reflux   . Hypertension   . Anemia   . Depression    Past Surgical History  Procedure Laterality Date  . Leep     Family History  Problem Relation Age of Onset  . Heart disease Mother   . Hyperlipidemia Mother   . Hypertension Mother   . Stroke Mother   . Asthma Sister   . Asthma Son    History  Substance Use Topics  . Smoking status: Never Smoker   . Smokeless tobacco: Never Used  . Alcohol Use: No   OB History   Grav Para Term Preterm Abortions TAB SAB Ect Mult Living   0 Review of Systems  10 point review of symptoms obtained and is negative with the exceptions of symptoms noted above   Allergies  Review of patient's allergies indicates no known allergies.  Home Medications   Prior to Admission medications   Medication Sig Start Date End Date Taking? Authorizing Provider  omeprazole (PRILOSEC) 40 MG capsule Take 1 capsule (40 mg total) by mouth daily. 08/11/13   Vale Haven, MD  ondansetron (ZOFRAN) 4 MG tablet Take 1 tablet (4 mg total) by mouth every 6 (six) hours as needed for nausea. 08/11/13   Vale Haven, MD  promethazine (PHENERGAN) 25 MG tablet Take 1 tablet (25 mg total) by mouth every 6 (six) hours as  needed for nausea or vomiting. 08/11/13   Vale Haven, MD  pyridOXINE (VITAMIN B-6) 100 MG tablet Take 1 tablet (100 mg total) by mouth 2 (two) times daily. 08/11/13   Vale Haven, MD  ranitidine (ZANTAC) 150 MG tablet Take 1 tablet (150 mg total) by mouth 2 (two) times daily. 10/25/13   Tereso Newcomer, MD  sertraline (ZOLOFT) 100 MG tablet Take 1 tablet (100 mg total) by mouth daily. 10/25/13   Tereso Newcomer, MD   BP 104/63  Pulse 95  SpO2 92%  LMP 07/01/2013 Physical Exam  Gen: well nourished and well developed appearing Head: NCAT Ears: normal to inspection Nose: normal to inspection, no epistaxis or drainage Mouth: oral mucsoa is dehydrated appearing, normal posterior oropharynx Neck: supple, no stridor CV: RRR, no murmur, palpable peripheral pulses Resp: lung sounds are clear to auscultation bilaterally, no wheeing or rhonchi or rales, normal respiratory effort.  Abd: soft, nontender, nondistended Extremities: normal to inspection.  Skin: warm and dry Neuro: CN ii - XII, no focal deficitis Psyche; normal affect, cooperative.   ED Course  Procedures (including critical care time) Labs Review  No results found for this or any previous visit (from the past 24 hour(s)).   MDM  DDX: hyperemesis secondary to pregnancy, UTI, gastroenteritis, gastritis.   We are resuscitating with IVF and awaiting lab results.   0981: Case signed out to Dr. Bebe Shaggy at change of shift. He will follow up on labs, re-examine and determine disposition.     Brandt Loosen, MD 10/29/13 [phone]

## 2013-10-29 NOTE — ED Notes (Signed)
Pt. Very wobbly when ambulating.   Pt. Required assistance when walking, very unsteady.  Reported to Dr. Bebe Shaggy.  IV fluids infusing.

## 2013-10-29 NOTE — ED Provider Notes (Signed)
Patient monitored for several hours Her vomiting has improved She is noted to have UTI Repeat BP is appropriate She refused all labs except for urinalysis Will d/c home with phenergan (already on zofran) and also keflex for UTI Urine culture sent Her abdomen is soft and no focal tenderness and denies vag bleeding (Pt approximately [redacted] weeks pregnant) Advised f/u with OBGYN this week   Joya Gaskins, MD 10/29/13 1258

## 2013-10-30 DIAGNOSIS — O210 Mild hyperemesis gravidarum: Secondary | ICD-10-CM

## 2013-10-30 LAB — URINE CULTURE

## 2013-10-30 LAB — PRESCRIPTION MONITORING PROFILE (19 PANEL)
BUPRENORPHINE, URINE: NEGATIVE ng/mL
Barbiturate Screen, Urine: NEGATIVE ng/mL
Benzodiazepine Screen, Urine: NEGATIVE ng/mL
COCAINE METABOLITES: NEGATIVE ng/mL
CREATININE, URINE: 287.77 mg/dL (ref >20.0)
Carisoprodol, Urine: NEGATIVE ng/mL
Fentanyl, Ur: NEGATIVE ng/mL
MDMA URINE: NEGATIVE ng/mL
METHAQUALONE SCREEN (URINE): NEGATIVE ng/mL
Meperidine, Ur: NEGATIVE ng/mL
Methadone Screen, Urine: NEGATIVE ng/mL
Nitrites, Initial: NEGATIVE ug/mL
Oxycodone Screen, Ur: NEGATIVE ng/mL
PHENCYCLIDINE, UR: NEGATIVE ng/mL
Propoxyphene: NEGATIVE ng/mL
Tapentadol, urine: NEGATIVE ng/mL
Tramadol Scrn, Ur: NEGATIVE ng/mL
ZOLPIDEM, URINE: NEGATIVE ng/mL
pH, Initial: 5.6 pH (ref 4.5–8.9)

## 2013-10-30 LAB — COMPREHENSIVE METABOLIC PANEL
ALT: 15 U/L (ref 0–35)
ANION GAP: 17 — AB (ref 5–15)
AST: 13 U/L (ref 0–37)
Albumin: 3.4 g/dL — ABNORMAL LOW (ref 3.5–5.2)
Alkaline Phosphatase: 66 U/L (ref 39–117)
BUN: 8 mg/dL (ref 6–23)
CALCIUM: 8.9 mg/dL (ref 8.4–10.5)
CO2: 17 meq/L — AB (ref 19–32)
CREATININE: 0.48 mg/dL — AB (ref 0.50–1.10)
Chloride: 101 mEq/L (ref 96–112)
GLUCOSE: 127 mg/dL — AB (ref 70–99)
Potassium: 3.3 mEq/L — ABNORMAL LOW (ref 3.7–5.3)
Sodium: 135 mEq/L — ABNORMAL LOW (ref 137–147)
TOTAL PROTEIN: 7.3 g/dL (ref 6.0–8.3)
Total Bilirubin: 0.2 mg/dL — ABNORMAL LOW (ref 0.3–1.2)

## 2013-10-30 LAB — LIPASE, BLOOD: LIPASE: 23 U/L (ref 11–59)

## 2013-10-30 LAB — URINALYSIS, ROUTINE W REFLEX MICROSCOPIC
GLUCOSE, UA: NEGATIVE mg/dL
Hgb urine dipstick: NEGATIVE
LEUKOCYTES UA: NEGATIVE
NITRITE: NEGATIVE
Protein, ur: 100 mg/dL — AB
Urobilinogen, UA: 0.2 mg/dL (ref 0.0–1.0)
pH: 6 (ref 5.0–8.0)

## 2013-10-30 LAB — CBC
HCT: 29.2 % — ABNORMAL LOW (ref 36.0–46.0)
HEMOGLOBIN: 10.1 g/dL — AB (ref 12.0–15.0)
MCH: 30.2 pg (ref 26.0–34.0)
MCHC: 34.6 g/dL (ref 30.0–36.0)
MCV: 87.4 fL (ref 78.0–100.0)
Platelets: 237 10*3/uL (ref 150–400)
RBC: 3.34 MIL/uL — ABNORMAL LOW (ref 3.87–5.11)
RDW: 13.8 % (ref 11.5–15.5)
WBC: 13.7 10*3/uL — ABNORMAL HIGH (ref 4.0–10.5)

## 2013-10-30 LAB — RAPID URINE DRUG SCREEN, HOSP PERFORMED
Amphetamines: NOT DETECTED
BENZODIAZEPINES: NOT DETECTED
Barbiturates: NOT DETECTED
COCAINE: NOT DETECTED
OPIATES: NOT DETECTED
Tetrahydrocannabinol: POSITIVE — AB

## 2013-10-30 LAB — OPIATES/OPIOIDS (LC/MS-MS)
Codeine Urine: NEGATIVE ng/mL (ref <50)
HYDROMORPHONE: NEGATIVE ng/mL (ref <50)
Hydrocodone: NEGATIVE ng/mL (ref <50)
MORPHINE: NEGATIVE ng/mL (ref <50)
NOROXYCODONE, UR: NEGATIVE ng/mL (ref <50)
Norhydrocodone, Ur: NEGATIVE ng/mL (ref <50)
Oxycodone, ur: NEGATIVE ng/mL (ref <50)
Oxymorphone: NEGATIVE ng/mL (ref <50)

## 2013-10-30 LAB — URINE MICROSCOPIC-ADD ON

## 2013-10-30 LAB — AMPHETAMINES (GC/LC/MS), URINE
Amphetamine GC/MS Conf: NEGATIVE ng/mL (ref <250)
Methamphetamine Quant, Ur: NEGATIVE ng/mL (ref <250)

## 2013-10-30 LAB — CANNABANOIDS (GC/LC/MS), URINE: THC-COOH UR CONFIRM: 930 ng/mL — AB (ref <5)

## 2013-10-30 LAB — AMYLASE: AMYLASE: 48 U/L (ref 0–105)

## 2013-10-30 MED ORDER — METOCLOPRAMIDE HCL 5 MG/ML IJ SOLN
10.0000 mg | Freq: Once | INTRAMUSCULAR | Status: AC
  Administered 2013-10-30: 10 mg via INTRAVENOUS
  Filled 2013-10-30: qty 2

## 2013-10-30 MED ORDER — PROMETHAZINE HCL 25 MG RE SUPP
25.0000 mg | Freq: Four times a day (QID) | RECTAL | Status: DC | PRN
Start: 2013-10-30 — End: 2013-12-17

## 2013-10-30 MED ORDER — FAMOTIDINE IN NACL 20-0.9 MG/50ML-% IV SOLN
20.0000 mg | Freq: Once | INTRAVENOUS | Status: AC
  Administered 2013-10-30: 20 mg via INTRAVENOUS
  Filled 2013-10-30: qty 50

## 2013-10-30 NOTE — MAU Note (Signed)
Pt. States that she has no way home. Nursing supervisor to be notified.

## 2013-10-30 NOTE — MAU Note (Signed)
Pt. sitting in room and has received a bus pass. Pt. will be in room until 5am or until bed is needed. Pt. sleeping in the bed.

## 2013-10-30 NOTE — Discharge Instructions (Signed)
Hyperemesis Gravidarum °Hyperemesis gravidarum is a severe form of nausea and vomiting that happens during pregnancy. Hyperemesis is worse than morning sickness. It may cause you to have nausea or vomiting all day for many days. It may keep you from eating and drinking enough food and liquids. Hyperemesis usually occurs during the first half (the first 20 weeks) of pregnancy. It often goes away once a woman is in her second half of pregnancy. However, sometimes hyperemesis continues through an entire pregnancy.  °CAUSES  °The cause of this condition is not completely known but is thought to be related to changes in the body's hormones when pregnant. It could be from the high level of the pregnancy hormone or an increase in estrogen in the body.  °SIGNS AND SYMPTOMS  °· Severe nausea and vomiting. °· Nausea that does not go away. °· Vomiting that does not allow you to keep any food down. °· Weight loss and body fluid loss (dehydration). °· Having no desire to eat or not liking food you have previously enjoyed. °DIAGNOSIS  °Your health care provider will do a physical exam and ask you about your symptoms. He or she may also order blood tests and urine tests to make sure something else is not causing the problem.  °TREATMENT  °You may only need medicine to control the problem. If medicines do not control the nausea and vomiting, you will be treated in the hospital to prevent dehydration, increased acid in the blood (acidosis), weight loss, and changes in the electrolytes in your body that may harm the unborn baby (fetus). You may need IV fluids.  °HOME CARE INSTRUCTIONS  °· Only take over-the-counter or prescription medicines as directed by your health care provider. °· Try eating a couple of dry crackers or toast in the morning before getting out of bed. °· Avoid foods and smells that upset your stomach. °· Avoid fatty and spicy foods. °· Eat 5-6 small meals a day. °· Do not drink when eating meals. Drink between  meals. °· For snacks, eat high-protein foods, such as cheese. °· Eat or suck on things that have ginger in them. Ginger helps nausea. °· Avoid food preparation. The smell of food can spoil your appetite. °· Avoid iron pills and iron in your multivitamins until after 3-4 months of being pregnant. However, consult with your health care provider before stopping any prescribed iron pills. °SEEK MEDICAL CARE IF:  °· Your abdominal pain increases. °· You have a severe headache. °· You have vision problems. °· You are losing weight. °SEEK IMMEDIATE MEDICAL CARE IF:  °· You are unable to keep fluids down. °· You vomit blood. °· You have constant nausea and vomiting. °· You have excessive weakness. °· You have extreme thirst. °· You have dizziness or fainting. °· You have a fever or persistent symptoms for more than 2-3 days. °· You have a fever and your symptoms suddenly get worse. °MAKE SURE YOU:  °· Understand these instructions. °· Will watch your condition. °· Will get help right away if you are not doing well or get worse. °Document Released: 02/22/2005 Document Revised: 12/13/2012 Document Reviewed: 10/04/2012 °[employer]® Patient Information ©2015 [employer]. This information is not intended to replace advice given to you by your health care provider. Make sure you discuss any questions you have with your health care provider. ° °

## 2013-10-30 NOTE — MAU Note (Signed)
Pt. Has signed discharge orders but cannot leave because she does not have a ride home. Pt. said that she is not going to get a bus voucher and sit in the lobby until 5:00am because that is "rude". Nurse supervisor to come and talk with pt.

## 2013-10-30 NOTE — MAU Note (Signed)
Out of bed to BR 

## 2013-10-30 NOTE — MAU Note (Signed)
Pt. Has not vomited in the past 45 minutes and is sleeping.

## 2013-10-30 NOTE — MAU Note (Signed)
Pt has vomited small amount of clear saliva x 2 in past 30 minutes.

## 2013-10-30 NOTE — MAU Provider Note (Signed)
Attestation of Attending Supervision of Advanced Practitioner (CNM/NP): Evaluation and management procedures were performed by the Advanced Practitioner under my supervision and collaboration.  I have reviewed the Advanced Practitioner's note and chart, and I agree with the management and plan.  Motty Borin 10/30/2013 9:15 PM   

## 2013-10-31 ENCOUNTER — Encounter (HOSPITAL_COMMUNITY): Payer: Self-pay

## 2013-10-31 ENCOUNTER — Other Ambulatory Visit: Payer: Self-pay | Admitting: Obstetrics & Gynecology

## 2013-10-31 ENCOUNTER — Ambulatory Visit (HOSPITAL_COMMUNITY)
Admission: RE | Admit: 2013-10-31 | Discharge: 2013-10-31 | Disposition: A | Discharge status: Home / Self Care | Payer: Medicaid Other | Source: Ambulatory Visit | Attending: Obstetrics & Gynecology | Admitting: Obstetrics & Gynecology

## 2013-10-31 VITALS — BP 123/80 | HR 95 | Wt 183.5 lb

## 2013-10-31 DIAGNOSIS — O09213 Supervision of pregnancy with history of pre-term labor, third trimester: Secondary | ICD-10-CM

## 2013-10-31 DIAGNOSIS — O10912 Unspecified pre-existing hypertension complicating pregnancy, second trimester: Secondary | ICD-10-CM

## 2013-10-31 DIAGNOSIS — O3440 Maternal care for other abnormalities of cervix, unspecified trimester: Secondary | ICD-10-CM | POA: Diagnosis not present

## 2013-10-31 DIAGNOSIS — O3442 Maternal care for other abnormalities of cervix, second trimester: Secondary | ICD-10-CM

## 2013-10-31 DIAGNOSIS — O3443 Maternal care for other abnormalities of cervix, third trimester: Secondary | ICD-10-CM

## 2013-10-31 DIAGNOSIS — O09219 Supervision of pregnancy with history of pre-term labor, unspecified trimester: Secondary | ICD-10-CM | POA: Insufficient documentation

## 2013-10-31 DIAGNOSIS — O10019 Pre-existing essential hypertension complicating pregnancy, unspecified trimester: Secondary | ICD-10-CM | POA: Insufficient documentation

## 2013-10-31 DIAGNOSIS — Z9889 Other specified postprocedural states: Secondary | ICD-10-CM

## 2013-11-01 ENCOUNTER — Encounter: Payer: Self-pay | Admitting: Obstetrics & Gynecology

## 2013-11-01 DIAGNOSIS — O4420 Partial placenta previa NOS or without hemorrhage, unspecified trimester: Secondary | ICD-10-CM | POA: Insufficient documentation

## 2013-11-14 ENCOUNTER — Encounter: Payer: Self-pay | Admitting: Obstetrics & Gynecology

## 2013-11-14 ENCOUNTER — Encounter (HOSPITAL_COMMUNITY): Payer: Self-pay

## 2013-11-14 ENCOUNTER — Ambulatory Visit (HOSPITAL_COMMUNITY)
Admission: RE | Admit: 2013-11-14 | Discharge: 2013-11-14 | Disposition: A | Discharge status: Home / Self Care | Payer: Medicaid Other | Source: Ambulatory Visit | Attending: Obstetrics & Gynecology | Admitting: Obstetrics & Gynecology

## 2013-11-14 VITALS — BP 123/65 | HR 104 | Wt 194.0 lb

## 2013-11-14 DIAGNOSIS — O09292 Supervision of pregnancy with other poor reproductive or obstetric history, second trimester: Secondary | ICD-10-CM

## 2013-11-14 DIAGNOSIS — O10019 Pre-existing essential hypertension complicating pregnancy, unspecified trimester: Secondary | ICD-10-CM | POA: Insufficient documentation

## 2013-11-14 DIAGNOSIS — O09299 Supervision of pregnancy with other poor reproductive or obstetric history, unspecified trimester: Secondary | ICD-10-CM | POA: Insufficient documentation

## 2013-11-14 DIAGNOSIS — O3440 Maternal care for other abnormalities of cervix, unspecified trimester: Secondary | ICD-10-CM | POA: Insufficient documentation

## 2013-11-14 DIAGNOSIS — Z87898 Personal history of other specified conditions: Secondary | ICD-10-CM

## 2013-11-14 DIAGNOSIS — O09219 Supervision of pregnancy with history of pre-term labor, unspecified trimester: Secondary | ICD-10-CM | POA: Diagnosis not present

## 2013-11-14 DIAGNOSIS — O3443 Maternal care for other abnormalities of cervix, third trimester: Secondary | ICD-10-CM

## 2013-11-14 DIAGNOSIS — O10912 Unspecified pre-existing hypertension complicating pregnancy, second trimester: Secondary | ICD-10-CM

## 2013-11-14 DIAGNOSIS — Z9889 Other specified postprocedural states: Secondary | ICD-10-CM

## 2013-11-14 DIAGNOSIS — F1021 Alcohol dependence, in remission: Secondary | ICD-10-CM | POA: Diagnosis not present

## 2013-11-14 DIAGNOSIS — O09212 Supervision of pregnancy with history of pre-term labor, second trimester: Secondary | ICD-10-CM

## 2013-11-14 DIAGNOSIS — O09213 Supervision of pregnancy with history of pre-term labor, third trimester: Secondary | ICD-10-CM

## 2013-11-22 ENCOUNTER — Encounter: Payer: Self-pay | Admitting: *Deleted

## 2013-11-22 ENCOUNTER — Encounter: Payer: Medicaid Other | Admitting: Obstetrics and Gynecology

## 2013-11-27 ENCOUNTER — Encounter: Payer: Self-pay | Admitting: Obstetrics and Gynecology

## 2013-11-27 ENCOUNTER — Ambulatory Visit (INDEPENDENT_AMBULATORY_CARE_PROVIDER_SITE_OTHER): Payer: Medicaid Other | Admitting: Obstetrics and Gynecology

## 2013-11-27 VITALS — BP 99/56 | HR 92 | Temp 98.4°F | Wt 193.6 lb

## 2013-11-27 DIAGNOSIS — O09219 Supervision of pregnancy with history of pre-term labor, unspecified trimester: Secondary | ICD-10-CM

## 2013-11-27 DIAGNOSIS — O09299 Supervision of pregnancy with other poor reproductive or obstetric history, unspecified trimester: Secondary | ICD-10-CM

## 2013-11-27 DIAGNOSIS — F32A Depression, unspecified: Secondary | ICD-10-CM

## 2013-11-27 DIAGNOSIS — O10019 Pre-existing essential hypertension complicating pregnancy, unspecified trimester: Secondary | ICD-10-CM

## 2013-11-27 DIAGNOSIS — O4410 Placenta previa with hemorrhage, unspecified trimester: Secondary | ICD-10-CM

## 2013-11-27 DIAGNOSIS — R87612 Low grade squamous intraepithelial lesion on cytologic smear of cervix (LGSIL): Secondary | ICD-10-CM

## 2013-11-27 DIAGNOSIS — O99340 Other mental disorders complicating pregnancy, unspecified trimester: Secondary | ICD-10-CM

## 2013-11-27 DIAGNOSIS — F1910 Other psychoactive substance abuse, uncomplicated: Secondary | ICD-10-CM

## 2013-11-27 DIAGNOSIS — Z23 Encounter for immunization: Secondary | ICD-10-CM

## 2013-11-27 DIAGNOSIS — F1990 Other psychoactive substance use, unspecified, uncomplicated: Secondary | ICD-10-CM

## 2013-11-27 DIAGNOSIS — O09212 Supervision of pregnancy with history of pre-term labor, second trimester: Secondary | ICD-10-CM

## 2013-11-27 DIAGNOSIS — O10912 Unspecified pre-existing hypertension complicating pregnancy, second trimester: Secondary | ICD-10-CM

## 2013-11-27 DIAGNOSIS — I10 Essential (primary) hypertension: Secondary | ICD-10-CM

## 2013-11-27 DIAGNOSIS — F329 Major depressive disorder, single episode, unspecified: Secondary | ICD-10-CM

## 2013-11-27 DIAGNOSIS — O4420 Partial placenta previa NOS or without hemorrhage, unspecified trimester: Secondary | ICD-10-CM

## 2013-11-27 DIAGNOSIS — F3289 Other specified depressive episodes: Secondary | ICD-10-CM

## 2013-11-27 DIAGNOSIS — O09292 Supervision of pregnancy with other poor reproductive or obstetric history, second trimester: Secondary | ICD-10-CM

## 2013-11-27 DIAGNOSIS — O210 Mild hyperemesis gravidarum: Secondary | ICD-10-CM

## 2013-11-27 LAB — POCT URINALYSIS DIP (DEVICE)
Bilirubin Urine: NEGATIVE
Glucose, UA: NEGATIVE mg/dL
Hgb urine dipstick: NEGATIVE
Nitrite: NEGATIVE
PROTEIN: NEGATIVE mg/dL
Specific Gravity, Urine: 1.02 (ref 1.005–1.030)
Urobilinogen, UA: 1 mg/dL (ref 0.0–1.0)
pH: 7 (ref 5.0–8.0)

## 2013-11-27 NOTE — Progress Notes (Signed)
C/o of occasional pelvic pressure.  Colpo today.  Flu vaccine today.

## 2013-11-27 NOTE — Progress Notes (Signed)
Patient is doing well today without complaints. [place] scheduled for 9/23. Colpo today secondary to LSIL pap smear Patient given informed consent, signed copy in the chart, time out was performed.  Placed in lithotomy position. Cervix viewed with speculum and colposcope after application of acetic acid.   Colposcopy adequate?  No TZ not visualized Acetowhite lesions?no Punctation?no Mosaicism?  no Abnormal vasculature?  no Biopsies?no ECC?no  COMMENTS: Patient was given post procedure instructions.  Will repeat pap smear 6 weeks pp

## 2013-11-28 ENCOUNTER — Ambulatory Visit (HOSPITAL_COMMUNITY)
Admission: RE | Admit: 2013-11-28 | Discharge: 2013-11-28 | Disposition: A | Discharge status: Home / Self Care | Payer: Medicaid Other | Source: Ambulatory Visit | Attending: Obstetrics & Gynecology | Admitting: Obstetrics & Gynecology

## 2013-11-28 ENCOUNTER — Encounter (HOSPITAL_COMMUNITY): Payer: Self-pay

## 2013-11-28 DIAGNOSIS — O09219 Supervision of pregnancy with history of pre-term labor, unspecified trimester: Secondary | ICD-10-CM | POA: Insufficient documentation

## 2013-11-28 DIAGNOSIS — O3440 Maternal care for other abnormalities of cervix, unspecified trimester: Secondary | ICD-10-CM | POA: Diagnosis not present

## 2013-11-28 DIAGNOSIS — O10912 Unspecified pre-existing hypertension complicating pregnancy, second trimester: Secondary | ICD-10-CM

## 2013-11-28 DIAGNOSIS — Z9889 Other specified postprocedural states: Secondary | ICD-10-CM

## 2013-11-28 DIAGNOSIS — O10019 Pre-existing essential hypertension complicating pregnancy, unspecified trimester: Secondary | ICD-10-CM | POA: Insufficient documentation

## 2013-11-28 DIAGNOSIS — O3443 Maternal care for other abnormalities of cervix, third trimester: Secondary | ICD-10-CM

## 2013-11-28 DIAGNOSIS — O09213 Supervision of pregnancy with history of pre-term labor, third trimester: Secondary | ICD-10-CM

## 2013-12-03 ENCOUNTER — Telehealth: Payer: Self-pay | Admitting: General Practice

## 2013-12-03 NOTE — Telephone Encounter (Signed)
Patient called and left message stating she needs a note faxed to housing authority about her depression, please call back. Called patient back and asked who managed her depression before pregnancy and patient states she went somewhere in [place] for a while. Told patient to contact them to provide a letter since they have managed her depression and that's not something we can provide because we have not seen her specifically for that. Patient verbalized understanding and had no other questions

## 2013-12-05 ENCOUNTER — Telehealth: Payer: Self-pay | Admitting: General Practice

## 2013-12-05 ENCOUNTER — Encounter: Payer: Self-pay | Admitting: General Practice

## 2013-12-05 NOTE — Telephone Encounter (Signed)
Patient called stating she needs a letter than says she is taking zoloft for depression. Told patient we can have that letter printed for her waiting at our front office. Patient verbalized understanding and states that she has been trying to reach [place] but has not had a call back yet. Told patient I will let Arline AspCindy know she called. Patient verbalized understanding and had no other questions

## 2013-12-05 NOTE — Telephone Encounter (Signed)
Patient called and left message stating she is calling for Dr Macon LargeAnyanwu

## 2013-12-12 ENCOUNTER — Encounter (HOSPITAL_COMMUNITY): Payer: Self-pay

## 2013-12-12 ENCOUNTER — Ambulatory Visit (HOSPITAL_COMMUNITY)
Admission: RE | Admit: 2013-12-12 | Discharge: 2013-12-12 | Disposition: A | Discharge status: Home / Self Care | Payer: Medicaid Other | Source: Ambulatory Visit | Attending: Advanced Practice Midwife | Admitting: Advanced Practice Midwife

## 2013-12-12 VITALS — BP 118/72 | HR 105 | Wt 192.8 lb

## 2013-12-12 DIAGNOSIS — O3442 Maternal care for other abnormalities of cervix, second trimester: Secondary | ICD-10-CM

## 2013-12-12 DIAGNOSIS — Z3A23 23 weeks gestation of pregnancy: Secondary | ICD-10-CM | POA: Diagnosis not present

## 2013-12-12 DIAGNOSIS — O3443 Maternal care for other abnormalities of cervix, third trimester: Secondary | ICD-10-CM

## 2013-12-12 DIAGNOSIS — O09213 Supervision of pregnancy with history of pre-term labor, third trimester: Secondary | ICD-10-CM | POA: Diagnosis not present

## 2013-12-12 DIAGNOSIS — Z9889 Other specified postprocedural states: Secondary | ICD-10-CM

## 2013-12-12 DIAGNOSIS — O10012 Pre-existing essential hypertension complicating pregnancy, second trimester: Secondary | ICD-10-CM | POA: Diagnosis not present

## 2013-12-12 DIAGNOSIS — O10912 Unspecified pre-existing hypertension complicating pregnancy, second trimester: Secondary | ICD-10-CM

## 2013-12-17 ENCOUNTER — Encounter (HOSPITAL_COMMUNITY): Payer: Self-pay | Admitting: General Practice

## 2013-12-17 ENCOUNTER — Inpatient Hospital Stay (HOSPITAL_COMMUNITY)
Admission: AD | Admit: 2013-12-17 | Discharge: 2013-12-17 | Disposition: A | Discharge status: Home / Self Care | Payer: Medicaid Other | Source: Ambulatory Visit | Attending: Family Medicine | Admitting: Family Medicine

## 2013-12-17 DIAGNOSIS — Z3A24 24 weeks gestation of pregnancy: Secondary | ICD-10-CM | POA: Diagnosis not present

## 2013-12-17 DIAGNOSIS — O4420 Partial placenta previa NOS or without hemorrhage, unspecified trimester: Secondary | ICD-10-CM

## 2013-12-17 DIAGNOSIS — O99342 Other mental disorders complicating pregnancy, second trimester: Secondary | ICD-10-CM | POA: Diagnosis not present

## 2013-12-17 DIAGNOSIS — O3443 Maternal care for other abnormalities of cervix, third trimester: Secondary | ICD-10-CM

## 2013-12-17 DIAGNOSIS — O212 Late vomiting of pregnancy: Secondary | ICD-10-CM | POA: Diagnosis not present

## 2013-12-17 DIAGNOSIS — O10012 Pre-existing essential hypertension complicating pregnancy, second trimester: Secondary | ICD-10-CM | POA: Diagnosis not present

## 2013-12-17 DIAGNOSIS — F329 Major depressive disorder, single episode, unspecified: Secondary | ICD-10-CM | POA: Diagnosis not present

## 2013-12-17 DIAGNOSIS — A084 Viral intestinal infection, unspecified: Secondary | ICD-10-CM

## 2013-12-17 DIAGNOSIS — O10912 Unspecified pre-existing hypertension complicating pregnancy, second trimester: Secondary | ICD-10-CM

## 2013-12-17 DIAGNOSIS — Z9889 Other specified postprocedural states: Secondary | ICD-10-CM

## 2013-12-17 DIAGNOSIS — O09292 Supervision of pregnancy with other poor reproductive or obstetric history, second trimester: Secondary | ICD-10-CM

## 2013-12-17 LAB — CBC WITH DIFFERENTIAL/PLATELET
Basophils Absolute: 0 10*3/uL (ref 0.0–0.1)
Basophils Relative: 0 % (ref 0–1)
Eosinophils Absolute: 0 10*3/uL (ref 0.0–0.7)
Eosinophils Relative: 0 % (ref 0–5)
HCT: 28.6 % — ABNORMAL LOW (ref 36.0–46.0)
Hemoglobin: 9.8 g/dL — ABNORMAL LOW (ref 12.0–15.0)
LYMPHS ABS: 1.1 10*3/uL (ref 0.7–4.0)
LYMPHS PCT: 10 % — AB (ref 12–46)
MCH: 30.3 pg (ref 26.0–34.0)
MCHC: 34.3 g/dL (ref 30.0–36.0)
MCV: 88.5 fL (ref 78.0–100.0)
Monocytes Absolute: 0.5 10*3/uL (ref 0.1–1.0)
Monocytes Relative: 5 % (ref 3–12)
NEUTROS ABS: 9 10*3/uL — AB (ref 1.7–7.7)
Neutrophils Relative %: 85 % — ABNORMAL HIGH (ref 43–77)
PLATELETS: 218 10*3/uL (ref 150–400)
RBC: 3.23 MIL/uL — AB (ref 3.87–5.11)
RDW: 13.4 % (ref 11.5–15.5)
WBC: 10.5 10*3/uL (ref 4.0–10.5)

## 2013-12-17 LAB — COMPREHENSIVE METABOLIC PANEL
ALK PHOS: 66 U/L (ref 39–117)
ALT: 11 U/L (ref 0–35)
AST: 13 U/L (ref 0–37)
Albumin: 2.8 g/dL — ABNORMAL LOW (ref 3.5–5.2)
Anion gap: 12 (ref 5–15)
BUN: 9 mg/dL (ref 6–23)
CHLORIDE: 105 meq/L (ref 96–112)
CO2: 22 meq/L (ref 19–32)
Calcium: 9 mg/dL (ref 8.4–10.5)
Creatinine, Ser: 0.56 mg/dL (ref 0.50–1.10)
GLUCOSE: 111 mg/dL — AB (ref 70–99)
POTASSIUM: 3.8 meq/L (ref 3.7–5.3)
SODIUM: 139 meq/L (ref 137–147)
Total Protein: 6.3 g/dL (ref 6.0–8.3)

## 2013-12-17 LAB — URINE MICROSCOPIC-ADD ON

## 2013-12-17 LAB — URINALYSIS, ROUTINE W REFLEX MICROSCOPIC
Bilirubin Urine: NEGATIVE
GLUCOSE, UA: NEGATIVE mg/dL
Hgb urine dipstick: NEGATIVE
KETONES UR: 40 mg/dL — AB
Leukocytes, UA: NEGATIVE
Nitrite: NEGATIVE
Protein, ur: 100 mg/dL — AB
Specific Gravity, Urine: >1.03 — ABNORMAL HIGH (ref 1.005–1.030)
Urobilinogen, UA: 0.2 mg/dL (ref 0.0–1.0)
pH: 6 (ref 5.0–8.0)

## 2013-12-17 MED ORDER — ONDANSETRON 8 MG/NS 50 ML IVPB
8.0000 mg | Freq: Once | INTRAVENOUS | Status: AC
Start: 2013-12-17 — End: 2013-12-17
  Administered 2013-12-17: 8 mg via INTRAVENOUS
  Filled 2013-12-17: qty 8

## 2013-12-17 MED ORDER — DEXTROSE 5 % IN LACTATED RINGERS IV BOLUS
1000.0000 mL | Freq: Once | INTRAVENOUS | Status: AC
  Administered 2013-12-17: 1000 mL via INTRAVENOUS

## 2013-12-17 MED ORDER — ONDANSETRON 8 MG/NS 50 ML IVPB: 8.0000 mg | Freq: Once | INTRAVENOUS | Status: DC

## 2013-12-17 MED ORDER — METOCLOPRAMIDE HCL 5 MG/ML IJ SOLN
10.0000 mg | Freq: Once | INTRAMUSCULAR | Status: AC
Start: 2013-12-17 — End: 2013-12-17
  Administered 2013-12-17: 10 mg via INTRAVENOUS
  Filled 2013-12-17: qty 2

## 2013-12-17 MED ORDER — ONDANSETRON HCL 4 MG PO TABS: 4.0000 mg | ORAL_TABLET | Freq: Four times a day (QID) | ORAL | Status: DC

## 2013-12-17 MED ORDER — PROMETHAZINE HCL 25 MG/ML IJ SOLN
12.5000 mg | Freq: Once | INTRAMUSCULAR | Status: AC
  Administered 2013-12-17: 12.5 mg via INTRAVENOUS
  Filled 2013-12-17: qty 1

## 2013-12-17 MED ORDER — PROMETHAZINE HCL 25 MG RE SUPP: 25.0000 mg | Freq: Four times a day (QID) | RECTAL | Status: DC | PRN

## 2013-12-17 NOTE — Progress Notes (Signed)
Dr. Loreta AveAcosta on unit, notified of pt, sending u/a. Will come see pt

## 2013-12-17 NOTE — MAU Provider Note (Cosign Needed)
History     CSN: [id number]  Arrival date and time: 12/17/13 1135   None     Chief Complaint  Patient presents with  . Nausea  . Emesis   Emesis  Associated symptoms include abdominal pain and coughing (mild x1-2d). Pertinent negatives include no chest pain, chills, diarrhea or fever.   Patient is 33 y.o. [id number] 6473w1d.  +FM, denies LOF, VB, contractions, vaginal discharge.   Chills, vomiting, sweats, no headache, lower abdominal cramping today.  Vomiting: nonbilious, nonbloody, liquids and solids, no associated fevers, sudden onset today.  Past Medical History  Diagnosis Date  . GERD (gastroesophageal reflux disease)   . Acid reflux   . Hypertension   . Anemia   . Depression     Past Surgical History  Procedure Laterality Date  . Leep      Family History  Problem Relation Age of Onset  . Heart disease Mother   . Hyperlipidemia Mother   . Hypertension Mother   . Stroke Mother   . Asthma Sister   . Asthma Son     History  Substance Use Topics  . Smoking status: Never Smoker   . Smokeless tobacco: Never Used  . Alcohol Use: No    Allergies: No Known Allergies  Prescriptions prior to admission  Medication Sig Dispense Refill  . omeprazole (PRILOSEC) 40 MG capsule Take 1 capsule (40 mg total) by mouth daily.  30 capsule  1  . ondansetron (ZOFRAN) 4 MG tablet Take 1 tablet (4 mg total) by mouth every 6 (six) hours as needed for nausea.  30 tablet  1  . promethazine (PHENERGAN) 25 MG suppository Place 1 suppository (25 mg total) rectally every 6 (six) hours as needed for nausea or vomiting.  12 each  0  . promethazine (PHENERGAN) 25 MG tablet Take 1 tablet (25 mg total) by mouth every 6 (six) hours as needed for nausea or vomiting.  10 tablet  0  . ranitidine (ZANTAC) 150 MG tablet Take 150 mg by mouth daily.      . sertraline (ZOLOFT) 100 MG tablet Take 1 tablet (100 mg total) by mouth daily.  30 tablet  3    Review of Systems  Constitutional: Negative  for fever and chills.  Respiratory: Positive for cough (mild x1-2d). Negative for shortness of breath.   Cardiovascular: Negative for chest pain and leg swelling.  Gastrointestinal: Positive for nausea, vomiting and abdominal pain. Negative for heartburn and diarrhea.  Genitourinary: Positive for frequency (current baseline). Negative for dysuria, urgency and hematuria.  Neurological:       Intermittent   Physical Exam   Blood pressure 107/89, pulse 75, temperature 98 F (36.7 C), temperature source Oral, resp. rate 20, last menstrual period 07/01/2013, SpO2 100.00%.  Physical Exam  Constitutional: She is oriented to person, place, and time. She appears well-developed and well-nourished.  HENT:  Head: Normocephalic and atraumatic.  Eyes: Conjunctivae and EOM are normal.  Neck: Normal range of motion.  Cardiovascular: Normal rate.   Respiratory: Effort normal. No respiratory distress.  GI: Soft. She exhibits no distension. There is no tenderness (no). There is no rebound.  Musculoskeletal: Normal range of motion. She exhibits no edema.  Neurological: She is alert and oriented to person, place, and time.  Skin: Skin is warm and dry. No erythema.     MAU Course  Procedures  MDM NST UA CBC, CMP  IV bolus 1L, Reglan and zofran IV Care transferred to Dr. Ane PaymentMcEachern at 1300, she  will reevaluate patient and if needed order labs at that time.  Symptoms are likely 2/2 viral gastroenteritis.  1700: Bolus completed. Continues to have nausea and retching (no true emesis). Tolerated ice chips, but states she continues to "vomit" and unable to tolerate PO. Will check labs CBC, CMP. Give IV phenergan. Give second IV bolus through new line as last line was only able to run fluids at 150-200/hr. Reassess.   2000: Labs wnl. Symptoms somewhat improved after second bolus. Discharge to home with antiemetics and close outpatient follow up.   Assessment and Plan    Patient is 33 y.o. [id number]  5135w1d with cHTN, depression here with nausea/vomiting and abdominal cramping likely secondary to viral gastroenteritis.  - IV fluid bolus x 2, IV Zofran x 1, Reglan x 1, Phenergan x 1 - UA consistent with dehydration, no evidence of infection - CBC, CMP unremarkable - Symptoms moderately improved with interventions. Continues to have nausea but successful PO challenge.  - Discharge home with Zofran 4 mg every 8 hours prn nausea, phenergan 25 mg suppository every 6 hours as needed for refractory nausea - Follow up with prenatal provider within one week for reassessment.  - Counseled on rest, plenty of fluids, antiemetics prn.     William DaltonMorgan Flem Enderle, MD

## 2013-12-17 NOTE — MAU Note (Signed)
Pt in via EMS for n/v since this am. Unable to keep any food/drink down.

## 2013-12-17 NOTE — Discharge Instructions (Signed)
[facility] were [facility] for nausea and vomiting. This is likely due to viral gastroenteritis (stomach bug). You were given IV fluids and your symptoms improved. We refilled your zofran and phenergan so that you can use these medications as needed. Please follow up with your prenatal provider as scheduled. If your symptoms worsen then you should follow up with your prenatal provider sooner.

## 2013-12-17 NOTE — MAU Note (Signed)
Pt was discharged via wheelchair per patient request. Pt took 2 blankets from the bed out to the door with her. When RN told her we did not give out the blankets, she refused to relinquish them and stated they had given her blankets before. RN took one blanket and let the patient keep the other blanket. Pt was given a bus pass for her ride home.

## 2013-12-18 ENCOUNTER — Encounter (HOSPITAL_COMMUNITY): Payer: Self-pay | Admitting: *Deleted

## 2013-12-18 ENCOUNTER — Inpatient Hospital Stay (HOSPITAL_COMMUNITY)
Admission: AD | Admit: 2013-12-18 | Discharge: 2013-12-18 | Disposition: A | Discharge status: Home / Self Care | Payer: Medicaid Other | Source: Ambulatory Visit | Attending: Family Medicine | Admitting: Family Medicine

## 2013-12-18 DIAGNOSIS — O212 Late vomiting of pregnancy: Secondary | ICD-10-CM | POA: Insufficient documentation

## 2013-12-18 DIAGNOSIS — Z3A24 24 weeks gestation of pregnancy: Secondary | ICD-10-CM | POA: Insufficient documentation

## 2013-12-18 DIAGNOSIS — O210 Mild hyperemesis gravidarum: Secondary | ICD-10-CM

## 2013-12-18 HISTORY — DX: Unspecified abnormal cytological findings in specimens from vagina: R87.629

## 2013-12-18 LAB — URINALYSIS, ROUTINE W REFLEX MICROSCOPIC
Bilirubin Urine: NEGATIVE
Glucose, UA: NEGATIVE mg/dL
Hgb urine dipstick: NEGATIVE
Ketones, ur: >80 mg/dL — AB
LEUKOCYTES UA: NEGATIVE
Nitrite: NEGATIVE
PROTEIN: NEGATIVE mg/dL
Specific Gravity, Urine: 1.02 (ref 1.005–1.030)
UROBILINOGEN UA: 0.2 mg/dL (ref 0.0–1.0)
pH: 6.5 (ref 5.0–8.0)

## 2013-12-18 LAB — OCCULT BLOOD GASTRIC / DUODENUM (SPECIMEN CUP): OCCULT BLOOD, GASTRIC: POSITIVE — AB

## 2013-12-18 LAB — RAPID URINE DRUG SCREEN, HOSP PERFORMED
Amphetamines: NOT DETECTED
BARBITURATES: NOT DETECTED
Benzodiazepines: NOT DETECTED
Cocaine: NOT DETECTED
Opiates: NOT DETECTED
Tetrahydrocannabinol: POSITIVE — AB

## 2013-12-18 MED ORDER — PROMETHAZINE HCL 25 MG/ML IJ SOLN
25.0000 mg | Freq: Once | INTRAMUSCULAR | Status: AC
  Administered 2013-12-18: 25 mg via INTRAVENOUS
  Filled 2013-12-18: qty 1

## 2013-12-18 MED ORDER — ONDANSETRON 8 MG/NS 50 ML IVPB
8.0000 mg | Freq: Once | INTRAVENOUS | Status: AC
Start: 2013-12-18 — End: 2013-12-18
  Administered 2013-12-18: 8 mg via INTRAVENOUS
  Filled 2013-12-18: qty 8

## 2013-12-18 MED ORDER — LACTATED RINGERS IV BOLUS (SEPSIS)
1000.0000 mL | Freq: Once | INTRAVENOUS | Status: AC
Start: 2013-12-18 — End: 2013-12-18
  Administered 2013-12-18: 1000 mL via INTRAVENOUS

## 2013-12-18 MED ORDER — ONDANSETRON 4 MG PO TBDP: 4.0000 mg | ORAL_TABLET | Freq: Once | ORAL | Status: DC

## 2013-12-18 MED ORDER — METOCLOPRAMIDE HCL 5 MG/ML IJ SOLN
20.0000 mg | Freq: Once | INTRAVENOUS | Status: AC
  Administered 2013-12-18: 20 mg via INTRAVENOUS
  Filled 2013-12-18: qty 4

## 2013-12-18 NOTE — MAU Provider Note (Signed)
History     CSN: [id number]  Arrival date and time: 12/18/13 10270326   None     Chief Complaint  Patient presents with  . Emesis  . Abdominal Pain   HPI 33 year old [id number] at 7485w2d who presents via EMS for evaluation of nausea/vomiting. She was seen in the MAU yesterday for the same complaint. She returns because she states she did not have a ride to pick up her prescriptions and the nausea was returning. She also reports some blood tinged streaks in the mucus she is coughing/retching up. Denies fevers, chills. Denies chest pain, shortness of breath. Denies dysuria. Denies changes in bowels.   + Fetal movement. Denies LOF, VB, contractions, vaginal discharge.   In the MAU yesterday, patient received LR bolus x 2, IV zofran, IV phenergan, IV reglan. Urinalysis was consistent with dehydration, but did not reveal evidence of infection. CBC and CMP was unrevealing. During evaluation, she slept for the majority of the visit although when woken she would loudly retch.   OB History   Grav Para Term Preterm Abortions TAB SAB Ect Mult Living   5 2 2  0 2  2   2       Past Medical History  Diagnosis Date  . GERD (gastroesophageal reflux disease)   . Acid reflux   . Hypertension   . Anemia   . Depression     Past Surgical History  Procedure Laterality Date  . Leep      Family History  Problem Relation Age of Onset  . Heart disease Mother   . Hyperlipidemia Mother   . Hypertension Mother   . Stroke Mother   . Asthma Sister   . Asthma Son     History  Substance Use Topics  . Smoking status: Never Smoker   . Smokeless tobacco: Never Used  . Alcohol Use: No    Allergies: No Known Allergies  Prescriptions prior to admission  Medication Sig Dispense Refill  . omeprazole (PRILOSEC) 40 MG capsule Take 1 capsule (40 mg total) by mouth daily.  30 capsule  1  . ondansetron (ZOFRAN) 4 MG tablet Take 1 tablet (4 mg total) by mouth every 6 (six) hours.  30 tablet  0  .  promethazine (PHENERGAN) 25 MG suppository Place 1 suppository (25 mg total) rectally every 6 (six) hours as needed for nausea or vomiting.  30 each  0  . ranitidine (ZANTAC) 150 MG tablet Take 150 mg by mouth daily.      . sertraline (ZOLOFT) 100 MG tablet Take 1 tablet (100 mg total) by mouth daily.  30 tablet  3    Review of Systems  Constitutional: Negative.  Negative for fever, chills and malaise/fatigue.  HENT: Negative.  Negative for congestion and sore throat.   Eyes: Negative.  Negative for blurred vision, double vision and photophobia.  Respiratory: Negative.  Negative for cough, hemoptysis, shortness of breath and wheezing.   Cardiovascular: Negative.  Negative for chest pain, palpitations and orthopnea.  Gastrointestinal: Positive for nausea, vomiting and abdominal pain. Negative for heartburn, diarrhea, constipation, blood in stool and melena.  Genitourinary: Negative.  Negative for dysuria, urgency, frequency and hematuria.  Musculoskeletal: Negative.  Negative for myalgias.  Skin: Negative.  Negative for rash.  Neurological: Negative.  Negative for headaches.  Psychiatric/Behavioral: Negative.    Physical Exam   Blood pressure 144/80, pulse 96, temperature 98.7 F (37.1 C), temperature source Oral, resp. rate 20, last menstrual period 07/01/2013, SpO2 100.00%.  Physical  Exam  Nursing note and vitals reviewed. Constitutional: She is oriented to person, place, and time. She appears well-developed and well-nourished. No distress.  Cardiovascular: Normal rate.   Respiratory: Effort normal.  GI: Soft. Bowel sounds are normal. She exhibits no distension. There is tenderness (mild, diffuse; no rebound or guarding). There is no rebound and no guarding.  Neurological: She is alert and oriented to person, place, and time.  Skin: Skin is warm and dry.  done by Dr. William DaltonMorgan McEachern  MAU Course  Procedures  MDM NST UA, UDS 1L LR bolus, IV Zofran 8 mg Gastrooccult  emesis  Urine studies and gastrooccult still pending. Transferred care to Dr. Loreta AveAcosta at 0800.   0900: pt still nausea, recent brown emesis. Will repeat phenergan 25mg  IV, reglan 20mg   Assessment and Plan  33 year old [id number] at 5973w2d who presents via EMS for evaluation of nausea/vomiting after being discharged from the MAU about 6 hours prior. Symptoms essentially unchanged but did not pick up her prescriptions as she did not have a ride to the pharmacy.   William DaltonMcEachern, Morgan 12/18/2013, 5:04 AM   Pt improved with repeat phenergan and reglan IV. All labs reviewed from past 24hrs. Prescriptions sent for phenergan and zofran. [facility]  Tawni CarnesAndrew Wight, MD 12/18/2013, 3:47 PM PGY-2, [facility]

## 2013-12-18 NOTE — Discharge Instructions (Signed)
There are 2 medications to pickup at the pharmacy to help with your nausea: Zofran 4mg , take very 6 hours as needed Phenergan 25mg , take every 6 hours as needed, this can be taken rectally or as vaginal suppository as well.  Alternate taking the zofran and phenergan if you are having breakthrough nausea with either of them.  Make sure you drink plenty of water, if you continue to vomit you can switch to sips and ice chips.  Below is information that applies to hyperemesis gravidarum, this does not mean you are having this exactly but it contains additional information regarding persistent nausea and vomiting. Hyperemesis Gravidarum Hyperemesis gravidarum is a severe form of nausea and vomiting that happens during pregnancy. Hyperemesis is worse than morning sickness. It may cause you to have nausea or vomiting all day for many days. It may keep you from eating and drinking enough food and liquids. Hyperemesis usually occurs during the first half (the first 20 weeks) of pregnancy. It often goes away once a woman is in her second half of pregnancy. However, sometimes hyperemesis continues through an entire pregnancy.  CAUSES  The cause of this condition is not completely known but is thought to be related to changes in the body's hormones when pregnant. It could be from the high level of the pregnancy hormone or an increase in estrogen in the body.  SIGNS AND SYMPTOMS   Severe nausea and vomiting.  Nausea that does not go away.  Vomiting that does not allow you to keep any food down.  Weight loss and body fluid loss (dehydration).  Having no desire to eat or not liking food you have previously enjoyed. DIAGNOSIS  Your health care provider will do a physical exam and ask you about your symptoms. He or she may also order blood tests and urine tests to make sure something else is not causing the problem.  TREATMENT  You may only need medicine to control the problem. If medicines do not control  the nausea and vomiting, you will be treated in the hospital to prevent dehydration, increased acid in the blood (acidosis), weight loss, and changes in the electrolytes in your body that may harm the unborn baby (fetus). You may need IV fluids.  HOME CARE INSTRUCTIONS   Only take over-the-counter or prescription medicines as directed by your health care provider.  Try eating a couple of dry crackers or toast in the morning before getting out of bed.  Avoid foods and smells that upset your stomach.  Avoid fatty and spicy foods.  Eat 5-6 small meals a day.  Do not drink when eating meals. Drink between meals.  For snacks, eat high-protein foods, such as cheese.  Eat or suck on things that have ginger in them. Ginger helps nausea.  Avoid food preparation. The smell of food can spoil your appetite.  Avoid iron pills and iron in your multivitamins until after 3-4 months of being pregnant. However, consult with your health care provider before stopping any prescribed iron pills. SEEK MEDICAL CARE IF:   Your abdominal pain increases.  You have a severe headache.  You have vision problems.  You are losing weight. SEEK IMMEDIATE MEDICAL CARE IF:   You are unable to keep fluids down.  You vomit blood.  You have constant nausea and vomiting.  You have excessive weakness.  You have extreme thirst.  You have dizziness or fainting.  You have a fever or persistent symptoms for more than 2-3 days.  You have  a fever and your symptoms suddenly get worse. MAKE SURE YOU:   Understand these instructions.  Will watch your condition.  Will get help right away if you are not doing well or get worse. Document Released: 02/22/2005 Document Revised: 12/13/2012 Document Reviewed: 10/04/2012 [facility] Patient Information 2015 [employer] [place]. This information is not intended to replace advice given to you by your health care provider. Make sure you discuss any questions you have with your  health care provider.

## 2013-12-18 NOTE — Progress Notes (Signed)
Dr. Waynetta SandyWight in to see patient. Medication ordered.

## 2013-12-18 NOTE — Progress Notes (Signed)
Dr. Waynetta SandyWight discussed D/C instrs. Patient states she will get her medication and take as directed.

## 2013-12-18 NOTE — MAU Note (Signed)
Received pt. back to MAU via EMS with c/o vomiting, nausea, pain in lower and upper abdomen. Pt states this started yesterday morning and is presently vomiting small amount of brown fluid. Pt has not been able to pick up prescriptions for nausea and vomiting as she got home too late from hospital. Pt was in MAU earlier.

## 2013-12-18 NOTE — Progress Notes (Addendum)
Dr. Ane PaymentMcEachern called in to check on patient. Informed of 1 episode of vomiting in past hour. Minimal vomitus.

## 2013-12-18 NOTE — Progress Notes (Signed)
Up to BR. Ice chips given per request. One episode of vomiting since 0700.

## 2013-12-19 ENCOUNTER — Inpatient Hospital Stay (HOSPITAL_COMMUNITY)
Admission: AD | Admit: 2013-12-19 | Discharge: 2013-12-21 | DRG: 781 | Disposition: A | Discharge status: Home / Self Care | Payer: Medicaid Other | Source: Ambulatory Visit | Attending: Obstetrics and Gynecology | Admitting: Obstetrics and Gynecology

## 2013-12-19 ENCOUNTER — Encounter (HOSPITAL_COMMUNITY): Payer: Self-pay | Admitting: *Deleted

## 2013-12-19 DIAGNOSIS — O4420 Partial placenta previa NOS or without hemorrhage, unspecified trimester: Secondary | ICD-10-CM

## 2013-12-19 DIAGNOSIS — O99612 Diseases of the digestive system complicating pregnancy, second trimester: Secondary | ICD-10-CM | POA: Diagnosis present

## 2013-12-19 DIAGNOSIS — O10912 Unspecified pre-existing hypertension complicating pregnancy, second trimester: Secondary | ICD-10-CM

## 2013-12-19 DIAGNOSIS — O10012 Pre-existing essential hypertension complicating pregnancy, second trimester: Secondary | ICD-10-CM | POA: Diagnosis present

## 2013-12-19 DIAGNOSIS — Z9889 Other specified postprocedural states: Secondary | ICD-10-CM

## 2013-12-19 DIAGNOSIS — Z8249 Family history of ischemic heart disease and other diseases of the circulatory system: Secondary | ICD-10-CM

## 2013-12-19 DIAGNOSIS — Z823 Family history of stroke: Secondary | ICD-10-CM

## 2013-12-19 DIAGNOSIS — O3443 Maternal care for other abnormalities of cervix, third trimester: Secondary | ICD-10-CM

## 2013-12-19 DIAGNOSIS — O212 Late vomiting of pregnancy: Principal | ICD-10-CM | POA: Diagnosis present

## 2013-12-19 DIAGNOSIS — K219 Gastro-esophageal reflux disease without esophagitis: Secondary | ICD-10-CM | POA: Diagnosis present

## 2013-12-19 DIAGNOSIS — O219 Vomiting of pregnancy, unspecified: Secondary | ICD-10-CM | POA: Diagnosis present

## 2013-12-19 DIAGNOSIS — E860 Dehydration: Secondary | ICD-10-CM | POA: Diagnosis present

## 2013-12-19 DIAGNOSIS — Z3A24 24 weeks gestation of pregnancy: Secondary | ICD-10-CM | POA: Diagnosis present

## 2013-12-19 DIAGNOSIS — O09292 Supervision of pregnancy with other poor reproductive or obstetric history, second trimester: Secondary | ICD-10-CM

## 2013-12-19 LAB — URINALYSIS, ROUTINE W REFLEX MICROSCOPIC
Glucose, UA: NEGATIVE mg/dL
Hgb urine dipstick: NEGATIVE
LEUKOCYTES UA: NEGATIVE
NITRITE: NEGATIVE
Protein, ur: 100 mg/dL — AB
Specific Gravity, Urine: >1.03 — ABNORMAL HIGH (ref 1.005–1.030)
Urobilinogen, UA: 4 mg/dL — ABNORMAL HIGH (ref 0.0–1.0)
pH: 6.5 (ref 5.0–8.0)

## 2013-12-19 LAB — AMYLASE: Amylase: 52 U/L (ref 0–105)

## 2013-12-19 LAB — URINE MICROSCOPIC-ADD ON

## 2013-12-19 MED ORDER — PROMETHAZINE HCL 25 MG PO TABS
12.5000 mg | ORAL_TABLET | Freq: Four times a day (QID) | ORAL | Status: DC | PRN
Start: 2013-12-19 — End: 2013-12-21
  Administered 2013-12-20 – 2013-12-21 (×4): 12.5 mg via ORAL
  Filled 2013-12-19 (×4): qty 1

## 2013-12-19 MED ORDER — METOCLOPRAMIDE HCL 5 MG/ML IJ SOLN
10.0000 mg | Freq: Four times a day (QID) | INTRAMUSCULAR | Status: DC | PRN
Start: 2013-12-19 — End: 2013-12-21
  Administered 2013-12-20 – 2013-12-21 (×6): 10 mg via INTRAVENOUS
  Filled 2013-12-19 (×7): qty 2

## 2013-12-19 MED ORDER — FAMOTIDINE IN NACL 20-0.9 MG/50ML-% IV SOLN
20.0000 mg | Freq: Once | INTRAVENOUS | Status: AC
  Administered 2013-12-19: 20 mg via INTRAVENOUS
  Filled 2013-12-19: qty 50

## 2013-12-19 MED ORDER — LACTATED RINGERS IV BOLUS (SEPSIS)
1000.0000 mL | Freq: Once | INTRAVENOUS | Status: AC
  Administered 2013-12-19: 1000 mL via INTRAVENOUS

## 2013-12-19 MED ORDER — POTASSIUM CHLORIDE 2 MEQ/ML IV SOLN
INTRAVENOUS | Status: DC
  Administered 2013-12-19 – 2013-12-21 (×6): via INTRAVENOUS
  Filled 2013-12-19 (×12): qty 1000

## 2013-12-19 MED ORDER — PROMETHAZINE HCL 25 MG/ML IJ SOLN
12.5000 mg | Freq: Four times a day (QID) | INTRAMUSCULAR | Status: DC | PRN
  Administered 2013-12-20 (×2): 12.5 mg via INTRAVENOUS
  Filled 2013-12-19 (×2): qty 1

## 2013-12-19 MED ORDER — PRENATAL MULTIVITAMIN CH: 1.0000 | ORAL_TABLET | Freq: Every day | ORAL | Status: DC

## 2013-12-19 MED ORDER — KCL IN DEXTROSE-NACL 20-5-0.9 MEQ/L-%-% IV SOLN: INTRAVENOUS | Status: DC

## 2013-12-19 MED ORDER — ACETAMINOPHEN 325 MG PO TABS: 650.0000 mg | ORAL_TABLET | ORAL | Status: DC | PRN

## 2013-12-19 MED ORDER — PROMETHAZINE HCL 25 MG/ML IJ SOLN
12.5000 mg | Freq: Once | INTRAMUSCULAR | Status: AC
  Administered 2013-12-19: 12.5 mg via INTRAVENOUS
  Filled 2013-12-19: qty 0.5

## 2013-12-19 MED ORDER — SERTRALINE HCL 100 MG PO TABS
100.0000 mg | ORAL_TABLET | Freq: Every day | ORAL | Status: DC
  Administered 2013-12-20 – 2013-12-21 (×2): 100 mg via ORAL
  Filled 2013-12-19 (×3): qty 1

## 2013-12-19 MED ORDER — ONDANSETRON 8 MG/NS 50 ML IVPB
8.0000 mg | Freq: Once | INTRAVENOUS | Status: AC
  Administered 2013-12-19: 8 mg via INTRAVENOUS
  Filled 2013-12-19: qty 8

## 2013-12-19 MED ORDER — PANTOPRAZOLE SODIUM 40 MG PO TBEC
80.0000 mg | DELAYED_RELEASE_TABLET | Freq: Every day | ORAL | Status: DC
  Administered 2013-12-20 – 2013-12-21 (×2): 80 mg via ORAL
  Filled 2013-12-19 (×3): qty 2

## 2013-12-19 NOTE — H&P (Signed)
Placed in observation for rehydration and antiemetics though the night after 3 visits to MAU in 3 days for nausea and vomiting. Please see the attached MAU provider note for evaluation. Patient has received 2 units of fluids and the MAU as well as anti-emetics and remains nauseated. She has 3 prior admissions this pregnancy for hyperemesis. In the past she has had satisfactory response to Phenergan, and Reglan. She has recently become frustrated by the rectal irritation from the Phenergan and has apparently not been using this as previously required. History    CSN: [id number]  Arrival date and time: 12/19/13 1347  First Provider Initiated Contact with Patient 12/19/13 1439  Chief Complaint   Patient presents with   .  Emesis During Pregnancy    HPI  [name] 33 y.o. [id number] @[redacted]w[redacted]d  presents to MAU complaining of vomiting, weakness and her butthole burning. She has used the phenergan suppository twice and both times she has noticed extreme burning. She cannot report on her other symptoms because it is hurting so badly. She last used the phenergan suppository at 12:30pm. She has vomited once since coming to MAU by EMS. She is very sleepy and does not completely answer questions. She denies abdominal pain, vaginal bleeding or dysuria. She reports everything she eats comes back up. She last ate yesterday after leaving the MAU. She has not had a bowel movement in several days and this is not normal for her. She endorses good fetal movement and denies vaginal bleeding, contractions, LOF, dysuria.  OB History    Grav  Para  Term  Preterm  Abortions  TAB  SAB  Ect  Mult  Living    5  2  2   0  2   2    2       Past Medical History   Diagnosis  Date   .  GERD (gastroesophageal reflux disease)    .  Acid reflux    .  Hypertension    .  Anemia    .  Depression    .  Vaginal Pap smear, abnormal     Past Surgical History   Procedure  Laterality  Date   .  Leep      Family History   Problem   Relation  Age of Onset   .  Heart disease  Mother    .  Hyperlipidemia  Mother    .  Hypertension  Mother    .  Stroke  Mother    .  Asthma  Sister    .  Asthma  Son     History   Substance Use Topics   .  Smoking status:  Never Smoker   .  Smokeless tobacco:  Never Used   .  Alcohol Use:  No    Allergies: No Known Allergies  Prescriptions prior to admission   Medication  Sig  Dispense  Refill   .  omeprazole (PRILOSEC) 40 MG capsule  Take 1 capsule (40 mg total) by mouth daily.  30 capsule  1   .  ondansetron (ZOFRAN) 4 MG tablet  Take 1 tablet (4 mg total) by mouth every 6 (six) hours.  30 tablet  0   .  promethazine (PHENERGAN) 25 MG suppository  Place 1 suppository (25 mg total) rectally every 6 (six) hours as needed for nausea or vomiting.  30 each  0   .  ranitidine (ZANTAC) 150 MG tablet  Take 150 mg by mouth daily.     .Marland Kitchen  sertraline (ZOLOFT) 100 MG tablet  Take 1 tablet (100 mg total) by mouth daily.  30 tablet  3    Review of Systems  Constitutional: Positive for fever, chills and diaphoresis.  HENT: Negative for sore throat.  Respiratory: Positive for shortness of breath. Negative for cough and wheezing.  Cardiovascular: Negative for chest pain and palpitations.  Gastrointestinal: Positive for heartburn, nausea and vomiting. Negative for abdominal pain, diarrhea and constipation.  Genitourinary: Negative for dysuria and frequency.  Skin: Negative for itching and rash.  Neurological: Positive for weakness. Negative for dizziness, tingling and headaches.  Psychiatric/Behavioral: Positive for depression. Negative for suicidal ideas and substance abuse. The patient is not nervous/anxious.   Physical Exam   Blood pressure 127/78, pulse 108, temperature 98.4 F (36.9 C), temperature source Oral, resp. rate 18, last menstrual period 07/01/2013.  Anette [email or web address]  Physical Exam  Constitutional: She is oriented to person, place, and time. She appears well-developed and well-nourished.   HENT:  Head: Normocephalic and atraumatic.  Eyes: EOM are normal.  Neck: Normal range of motion.  Cardiovascular: Normal rate and regular rhythm.  Respiratory: Effort normal and breath sounds normal. No respiratory distress.  GI: Soft. Bowel sounds are normal. She exhibits no distension. There is tenderness.  Diffusely tender throughout  Musculoskeletal: Normal range of motion.  Neurological: She is alert and oriented to person, place, and time.  Skin: Skin is warm and dry.  Psychiatric: She has a normal mood and affect.   Results for orders placed during the hospital encounter of 12/19/13 (from the past 24 hour(s))   URINALYSIS, ROUTINE W REFLEX MICROSCOPIC Status: Abnormal    Collection Time    12/19/13 2:05 PM   Result  Value  Ref Range    Color, Urine  YELLOW  YELLOW    APPearance  CLEAR  CLEAR    Specific Gravity, Urine  >1.030 (*)  1.005 - 1.030    pH  6.5  5.0 - 8.0    Glucose, UA  NEGATIVE  NEGATIVE mg/dL    Hgb urine dipstick  NEGATIVE  NEGATIVE    Bilirubin Urine  SMALL (*)  NEGATIVE    Ketones, ur  >80 (*)  NEGATIVE mg/dL    Protein, ur  841100 (*)  NEGATIVE mg/dL    Urobilinogen, UA  4.0 (*)  0.0 - 1.0 mg/dL    Nitrite  NEGATIVE  NEGATIVE    Leukocytes, UA  NEGATIVE  NEGATIVE   URINE MICROSCOPIC-ADD ON Status: Abnormal    Collection Time    12/19/13 2:05 PM   Result  Value  Ref Range    Squamous Epithelial / LPF  MANY (*)  RARE    WBC, UA  3-6  <3 WBC/hpf    Urine-Other  MUCOUS PRESENT     Fetal monitoring: poor quality tracing mostly due to maternal movements and unwillingness to continue. No concerning markers identified.  MAU Course   Procedures  MAU course:  IV started with LR. Pt given IV Zofran, Pepcid and Phenergan. After 2 L of fluid and all three medicines, she experienced one hour without vomiting. She continues to desire admission.  MDM  Discussed with Dr. Emelda FearFerguson whom has agreed to admit patient for further observation and hydration. He is aware of  her elevated blood pressures.  Assessment and Plan   A: Dehydration with nausea and vomiting in pregnancy  P: Admit to 3rd floor for observation per Dr. Emelda FearFerguson.  Bertram Denvereague Clark, Karen E  12/19/2013, 2:40 PM
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
## 2013-12-19 NOTE — MAU Provider Note (Signed)
History     CSN: [id number]  Arrival date and time: 12/19/13 1347   First Provider Initiated Contact with Patient 12/19/13 1439      Chief Complaint  Patient presents with  . Emesis During Pregnancy   HPI [name] 33 y.o. [id number] @[redacted]w[redacted]d  presents to MAU complaining of vomiting, weakness and her butthole burning.  She has used the phenergan suppository twice and both times she has noticed extreme burning.  She cannot report on her other symptoms because it is hurting so badly.  She last used the phenergan suppository at 12:30pm.  She has vomited once since coming to MAU by EMS.  She is very sleepy and does not completely answer questions.  She denies abdominal pain, vaginal bleeding or dysuria.  She reports everything she eats comes back up.  She last ate yesterday after leaving the MAU.  She has not had a bowel movement in several days and this is not normal for her. She endorses good fetal movement and denies vaginal bleeding, contractions, LOF, dysuria. OB History   Grav Para Term Preterm Abortions TAB SAB Ect Mult Living   5 2 2  0 2  2   2       Past Medical History  Diagnosis Date  . GERD (gastroesophageal reflux disease)   . Acid reflux   . Hypertension   . Anemia   . Depression   . Vaginal Pap smear, abnormal     Past Surgical History  Procedure Laterality Date  . Leep      Family History  Problem Relation Age of Onset  . Heart disease Mother   . Hyperlipidemia Mother   . Hypertension Mother   . Stroke Mother   . Asthma Sister   . Asthma Son     History  Substance Use Topics  . Smoking status: Never Smoker   . Smokeless tobacco: Never Used  . Alcohol Use: No    Allergies: No Known Allergies  Prescriptions prior to admission  Medication Sig Dispense Refill  . omeprazole (PRILOSEC) 40 MG capsule Take 1 capsule (40 mg total) by mouth daily.  30 capsule  1  . ondansetron (ZOFRAN) 4 MG tablet Take 1 tablet (4 mg total) by mouth every 6 (six) hours.   30 tablet  0  . promethazine (PHENERGAN) 25 MG suppository Place 1 suppository (25 mg total) rectally every 6 (six) hours as needed for nausea or vomiting.  30 each  0  . ranitidine (ZANTAC) 150 MG tablet Take 150 mg by mouth daily.      . sertraline (ZOLOFT) 100 MG tablet Take 1 tablet (100 mg total) by mouth daily.  30 tablet  3    Review of Systems  Constitutional: Positive for fever, chills and diaphoresis.  HENT: Negative for sore throat.   Respiratory: Positive for shortness of breath. Negative for cough and wheezing.   Cardiovascular: Negative for chest pain and palpitations.  Gastrointestinal: Positive for heartburn, nausea and vomiting. Negative for abdominal pain, diarrhea and constipation.  Genitourinary: Negative for dysuria and frequency.  Skin: Negative for itching and rash.  Neurological: Positive for weakness. Negative for dizziness, tingling and headaches.  Psychiatric/Behavioral: Positive for depression. Negative for suicidal ideas and substance abuse. The patient is not nervous/anxious.    Physical Exam   Blood pressure 127/78, pulse 108, temperature 98.4 F (36.9 C), temperature source Oral, resp. rate 18, last menstrual period 07/01/2013. Anette [email or web address] Physical Exam  Constitutional: She is oriented to person, place, and time. She  appears well-developed and well-nourished.  HENT:  Head: Normocephalic and atraumatic.  Eyes: EOM are normal.  Neck: Normal range of motion.  Cardiovascular: Normal rate and regular rhythm.   Respiratory: Effort normal and breath sounds normal. No respiratory distress.  GI: Soft. Bowel sounds are normal. She exhibits no distension. There is tenderness.  Diffusely tender throughout  Musculoskeletal: Normal range of motion.  Neurological: She is alert and oriented to person, place, and time.  Skin: Skin is warm and dry.  Psychiatric: She has a normal mood and affect.   Results for orders placed during the hospital encounter of 12/19/13 (from  the past 24 hour(s))  URINALYSIS, ROUTINE W REFLEX MICROSCOPIC     Status: Abnormal   Collection Time    12/19/13  2:05 PM      Result Value Ref Range   Color, Urine YELLOW  YELLOW   APPearance CLEAR  CLEAR   Specific Gravity, Urine >1.030 (*) 1.005 - 1.030   pH 6.5  5.0 - 8.0   Glucose, UA NEGATIVE  NEGATIVE mg/dL   Hgb urine dipstick NEGATIVE  NEGATIVE   Bilirubin Urine SMALL (*) NEGATIVE   Ketones, ur >80 (*) NEGATIVE mg/dL   Protein, ur 409100 (*) NEGATIVE mg/dL   Urobilinogen, UA 4.0 (*) 0.0 - 1.0 mg/dL   Nitrite NEGATIVE  NEGATIVE   Leukocytes, UA NEGATIVE  NEGATIVE  URINE MICROSCOPIC-ADD ON     Status: Abnormal   Collection Time    12/19/13  2:05 PM      Result Value Ref Range   Squamous Epithelial / LPF MANY (*) RARE   WBC, UA 3-6  <3 WBC/hpf   Urine-Other MUCOUS PRESENT       Fetal monitoring: poor quality tracing mostly due to maternal movements and unwillingness to continue.  No concerning markers identified. MAU Course  Procedures MAU course: IV started with LR.  Pt given IV Zofran, Pepcid and Phenergan.  After 2 L of fluid and all three medicines, she experienced one hour without vomiting.  She continues to desire admission.   MDM Discussed with Dr. Emelda FearFerguson whom has agreed to admit patient for further observation and hydration.  He is aware of her elevated blood pressures.    Assessment and Plan  A: Dehydration with nausea and vomiting in pregnancy  P: Admit to 3rd floor for observation per Dr. Emelda FearFerguson.     Bertram Denvereague Clark, Karen E 12/19/2013, 2:40 PM

## 2013-12-19 NOTE — MAU Note (Signed)
Pt restless, constantly moving legs.  Vomited once since arrival.

## 2013-12-19 NOTE — Progress Notes (Signed)
Pt requesting EFM be removed, states the monitor is bothering her & she doesn't want it on.  Pt removed EFM herself, PA aware.

## 2013-12-19 NOTE — MAU Note (Signed)
Pt came in by EMS, states she is still vomiting despite her medications.  Pt states she took a phenergan suppository approx an hour ago and it is burning her rectum.  States she took another one yesterday & it also burned.

## 2013-12-20 DIAGNOSIS — Z823 Family history of stroke: Secondary | ICD-10-CM | POA: Diagnosis not present

## 2013-12-20 DIAGNOSIS — E860 Dehydration: Secondary | ICD-10-CM | POA: Diagnosis present

## 2013-12-20 DIAGNOSIS — O212 Late vomiting of pregnancy: Secondary | ICD-10-CM | POA: Diagnosis present

## 2013-12-20 DIAGNOSIS — Z8249 Family history of ischemic heart disease and other diseases of the circulatory system: Secondary | ICD-10-CM | POA: Diagnosis not present

## 2013-12-20 DIAGNOSIS — K219 Gastro-esophageal reflux disease without esophagitis: Secondary | ICD-10-CM | POA: Diagnosis present

## 2013-12-20 DIAGNOSIS — O10012 Pre-existing essential hypertension complicating pregnancy, second trimester: Secondary | ICD-10-CM | POA: Diagnosis present

## 2013-12-20 DIAGNOSIS — O219 Vomiting of pregnancy, unspecified: Secondary | ICD-10-CM

## 2013-12-20 DIAGNOSIS — O99612 Diseases of the digestive system complicating pregnancy, second trimester: Secondary | ICD-10-CM | POA: Diagnosis present

## 2013-12-20 DIAGNOSIS — Z3A24 24 weeks gestation of pregnancy: Secondary | ICD-10-CM | POA: Diagnosis present

## 2013-12-20 LAB — RAPID URINE DRUG SCREEN, HOSP PERFORMED
AMPHETAMINES: NOT DETECTED
Barbiturates: NOT DETECTED
Benzodiazepines: NOT DETECTED
Cocaine: NOT DETECTED
Opiates: NOT DETECTED
Tetrahydrocannabinol: POSITIVE — AB

## 2013-12-20 MED ORDER — METHYLPREDNISOLONE 4 MG PO TABS: 4.0000 mg | ORAL_TABLET | Freq: Every day | ORAL | Status: DC

## 2013-12-20 MED ORDER — METHYLPREDNISOLONE 16 MG PO TABS
16.0000 mg | ORAL_TABLET | Freq: Every day | ORAL | Status: DC
  Administered 2013-12-21: 16 mg via ORAL
  Filled 2013-12-20 (×2): qty 1

## 2013-12-20 MED ORDER — METHYLPREDNISOLONE 16 MG PO TABS
16.0000 mg | ORAL_TABLET | Freq: Every day | ORAL | Status: DC
  Filled 2013-12-20: qty 1

## 2013-12-20 MED ORDER — METHYLPREDNISOLONE 4 MG PO TABS: 8.0000 mg | ORAL_TABLET | Freq: Every day | ORAL | Status: DC

## 2013-12-20 MED ORDER — METHYLPREDNISOLONE SODIUM SUCC 125 MG IJ SOLR
48.0000 mg | Freq: Once | INTRAMUSCULAR | Status: AC
  Administered 2013-12-20: 48 mg via INTRAVENOUS
  Filled 2013-12-20: qty 0.77

## 2013-12-20 NOTE — MAU Provider Note (Signed)
Attestation of Attending Supervision of Advanced Practitioner: Evaluation and management procedures were performed by the PA/NP/CNM/OB Fellow under my supervision/collaboration. Chart reviewed and agree with management and plan.  Dillyn Joaquin V 12/20/2013 11:31 AM

## 2013-12-20 NOTE — Progress Notes (Signed)
Patient ID: [name], female   DOB: [birth year], 33 y.o.   MRN: [id number] FACULTY PRACTICE ANTEPARTUM(COMPREHENSIVE) NOTE  [name] is a 33 y.o. [id number] at 3740w4d by best clinical estimate who is admitted for hyperemesis.   Fetal presentation is unsure. Length of Stay:  1  Days  Subjective: Still with nausea and emesis Patient reports the fetal movement as active. Patient reports uterine contraction  activity as none. Patient reports  vaginal bleeding as none. Patient describes fluid per vagina as None.  Vitals:  Blood pressure 137/69, pulse 92, temperature 98.2 F (36.8 C), temperature source Oral, resp. rate 20, height 5\' 3"  (1.6 m), weight 189 lb (85.73 kg), last menstrual period 07/01/2013. Physical Examination:  General appearance - alert, well appearing, and in no distress Abdomen - gravid, NT Fundal Height:  size equals dates Extremities: extremities normal, atraumatic, no cyanosis or edema  Membranes:intact  Fetal Monitoring:  Baseline: 145 bpm, Variability: Good {> 6 bpm), Accelerations: Reactive and Decelerations: Absent  Labs:  Results for orders placed during the hospital encounter of 12/19/13 (from the past 24 hour(s))  URINALYSIS, ROUTINE W REFLEX MICROSCOPIC   Collection Time    12/19/13  2:05 PM      Result Value Ref Range   Color, Urine YELLOW  YELLOW   APPearance CLEAR  CLEAR   Specific Gravity, Urine >1.030 (*) 1.005 - 1.030   pH 6.5  5.0 - 8.0   Glucose, UA NEGATIVE  NEGATIVE mg/dL   Hgb urine dipstick NEGATIVE  NEGATIVE   Bilirubin Urine SMALL (*) NEGATIVE   Ketones, ur >80 (*) NEGATIVE mg/dL   Protein, ur 147100 (*) NEGATIVE mg/dL   Urobilinogen, UA 4.0 (*) 0.0 - 1.0 mg/dL   Nitrite NEGATIVE  NEGATIVE   Leukocytes, UA NEGATIVE  NEGATIVE  URINE MICROSCOPIC-ADD ON   Collection Time    12/19/13  2:05 PM      Result Value Ref Range   Squamous Epithelial / LPF MANY (*) RARE   WBC, UA 3-6  <3 WBC/hpf   Urine-Other MUCOUS PRESENT    URINE  RAPID DRUG SCREEN (HOSP PERFORMED)   Collection Time    12/19/13 10:10 PM      Result Value Ref Range   Opiates NONE DETECTED  NONE DETECTED   Cocaine NONE DETECTED  NONE DETECTED   Benzodiazepines NONE DETECTED  NONE DETECTED   Amphetamines NONE DETECTED  NONE DETECTED   Tetrahydrocannabinol POSITIVE (*) NONE DETECTED   Barbiturates NONE DETECTED  NONE DETECTED    Medications:  Scheduled . pantoprazole  80 mg Oral Daily  . prenatal multivitamin  1 tablet Oral Q1200  . sertraline  100 mg Oral Daily   I have reviewed the patient's current medications.  ASSESSMENT: Patient Active Problem List   Diagnosis Date Noted  . Nausea and vomiting of pregnancy, antepartum 12/19/2013  . Marginal placenta previa on 17 week scan 11/01/2013  . Gastroesophageal reflux during pregnancy, antepartum 10/25/2013  . Depression affecting pregnancy, antepartum 10/25/2013  . Previous preterm delivery, antepartum 10/23/2013  . History of preeclampsia, prior pregnancy, currently pregnant 10/23/2013  . Chronic hypertension complicating pregnancy, antepartum 10/23/2013  . History of suicide attempt 10/23/2013  . Substance use disorder 10/23/2013  . History of depression 10/23/2013  . Abnormal Pap smear of cervix 10/23/2013  . Dehydration 08/08/2013  . Hypokalemia 08/08/2013  . Hyperemesis complicating pregnancy, antepartum 08/08/2013  . Essential hypertension, benign 08/08/2013  . 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  History of alcohol use 08/08/2013  . H/O alcoholic gastritis 08/08/2013    PLAN: Antiemetics Trial of Steroids  Duayne Brideau S, MD 12/20/2013,11:53 AM

## 2013-12-20 NOTE — Progress Notes (Signed)
UR completed 

## 2013-12-20 NOTE — Progress Notes (Signed)
Pharmacy Consult:   MEDROL (METHYLPREDNISOLONE) TAPER  FOR HYPEREMESIS GRAVIDARUM PATIENTS  The following is a 14 day taper of methylprednisolone for hyperemesis. Doses on day 1  will be given IV.  All doses starting on day 2  will be given PO. (If patient cannot tolerate oral medications, contact the pharmacy to change route to IV.)   Date Day Morning Midday Bedtime  12/20/13 1  48 mg   12/21/13 2 16  mg 16 mg 16 mg  12/22/13 3 16  mg 16 mg 16 mg  12/23/13 4 16  mg 8 mg 16 mg  12/24/13 5 16  mg 8 mg 8 mg  12/25/13 6 8  mg 8 mg 8 mg  12/26/13 7 8  mg 4 mg 8 mg  12/27/13 8 8  mg 4 mg 4 mg  12/28/13 9 8  mg 4 mg   12/29/13 10 8  mg 4 mg   12/30/13 11 8  mg    12/31/13 12 8  mg    01/01/14 13 4  mg    01/02/14 14 4  mg     Check fasting blood sugars daily while on the taper. Notify MD if fasting blood sugar>95.  Natasha BenceCline, Jaxtyn Linville 12/20/2013

## 2013-12-21 LAB — GLUCOSE, RANDOM: GLUCOSE: 106 mg/dL — AB (ref 70–99)

## 2013-12-21 LAB — COMPREHENSIVE METABOLIC PANEL
ALK PHOS: 62 U/L (ref 39–117)
ALT: 15 U/L (ref 0–35)
AST: 14 U/L (ref 0–37)
Albumin: 2.7 g/dL — ABNORMAL LOW (ref 3.5–5.2)
Anion gap: 10 (ref 5–15)
BUN: 3 mg/dL — ABNORMAL LOW (ref 6–23)
CO2: 22 meq/L (ref 19–32)
Calcium: 8.5 mg/dL (ref 8.4–10.5)
Chloride: 105 mEq/L (ref 96–112)
Creatinine, Ser: 0.49 mg/dL — ABNORMAL LOW (ref 0.50–1.10)
Glucose, Bld: 112 mg/dL — ABNORMAL HIGH (ref 70–99)
POTASSIUM: 2.9 meq/L — AB (ref 3.7–5.3)
SODIUM: 137 meq/L (ref 137–147)
TOTAL PROTEIN: 6.4 g/dL (ref 6.0–8.3)
Total Bilirubin: 0.2 mg/dL — ABNORMAL LOW (ref 0.3–1.2)

## 2013-12-21 LAB — CBC
HCT: 27 % — ABNORMAL LOW (ref 36.0–46.0)
Hemoglobin: 9.2 g/dL — ABNORMAL LOW (ref 12.0–15.0)
MCH: 30.3 pg (ref 26.0–34.0)
MCHC: 34.1 g/dL (ref 30.0–36.0)
MCV: 88.8 fL (ref 78.0–100.0)
Platelets: 214 10*3/uL (ref 150–400)
RBC: 3.04 MIL/uL — AB (ref 3.87–5.11)
RDW: 13.4 % (ref 11.5–15.5)
WBC: 9 10*3/uL (ref 4.0–10.5)

## 2013-12-21 LAB — PROTEIN / CREATININE RATIO, URINE
Creatinine, Urine: 36.16 mg/dL
Protein Creatinine Ratio: 0.16 — ABNORMAL HIGH (ref 0.00–0.15)
Total Protein, Urine: 5.7 mg/dL

## 2013-12-21 LAB — GLUCOSE, CAPILLARY: GLUCOSE-CAPILLARY: 110 mg/dL — AB (ref 70–99)

## 2013-12-21 MED ORDER — METHYLPREDNISOLONE 4 MG PO TABS: ORAL_TABLET | ORAL | Status: DC

## 2013-12-21 MED ORDER — PRENATAL MULTIVITAMIN CH: 1.0000 | ORAL_TABLET | Freq: Every day | ORAL | Status: DC

## 2013-12-21 MED ORDER — ENSURE COMPLETE PO LIQD
237.0000 mL | Freq: Two times a day (BID) | ORAL | Status: DC
  Administered 2013-12-21 (×2): 237 mL via ORAL
  Filled 2013-12-21 (×2): qty 237

## 2013-12-21 MED ORDER — ONDANSETRON HCL 4 MG PO TABS: 4.0000 mg | ORAL_TABLET | Freq: Four times a day (QID) | ORAL | Status: DC

## 2013-12-21 MED ORDER — POTASSIUM CHLORIDE CRYS ER 20 MEQ PO TBCR: 40.0000 meq | EXTENDED_RELEASE_TABLET | Freq: Every day | ORAL | Status: DC

## 2013-12-21 MED ORDER — OMEPRAZOLE 40 MG PO CPDR: 40.0000 mg | DELAYED_RELEASE_CAPSULE | Freq: Every day | ORAL | Status: DC

## 2013-12-21 MED ORDER — PROMETHAZINE HCL 25 MG PO TABS: 25.0000 mg | ORAL_TABLET | Freq: Four times a day (QID) | ORAL | Status: DC | PRN

## 2013-12-21 NOTE — Progress Notes (Signed)
CRITICAL VALUE ALERT  Critical value received:  K+ 2.9  Date of notification:  12/21/2013  Time of notification:  1505  Critical value read back: yes  Nurse who received alert:  Glory RosebushBriana Nadea Kirkland, RN [phone]  MD notified (1st page):  Dr. Macon LargeAnyanwu  Time of first page:  1505  MD notified (2nd page):  Time of second page: n/a  Responding MD:  Anyanwu  Time MD responded:  1505

## 2013-12-21 NOTE — Progress Notes (Signed)
Antenatal Nutrition Assessment:  Currently  24 5/[redacted] weeks gestation, with hyperemesis. Height  63 "  Weight 193 lbs  pre-pregnancy weight 187 lbs .  Pre-pregnancy  BMI 34  IBW 115 lbs Total weight gain 6.lbs Weight gain goals 11-20 lbs Estimated needs: 21-2300 kcal/day, 67-87 grams protein/day, 2.3 liters fluid/day  C/L diet, pt requests Ensure supplement Pt indicates she is still vomiting   Nutrition related labs  CMP     Component Value Date/Time   NA 139 12/17/2013 1920   K 3.8 12/17/2013 1920   CL 105 12/17/2013 1920   CO2 22 12/17/2013 1920   GLUCOSE 106* 12/21/2013 0540   BUN 9 12/17/2013 1920   CREATININE 0.56 12/17/2013 1920   CREATININE 0.54 10/25/2013 1324   CALCIUM 9.0 12/17/2013 1920   PROT 6.3 12/17/2013 1920   ALBUMIN 2.8* 12/17/2013 1920   AST 13 12/17/2013 1920   ALT 11 12/17/2013 1920   ALKPHOS 66 12/17/2013 1920   BILITOT <0.2* 12/17/2013 1920   GFRNONAA >90 12/17/2013 1920   GFRAA >90 12/17/2013 1920     Nutrition Dx: Increased nutrient needs r/t pregnancy and fetal growth requirements aeb [redacted] weeks gestation.   Reviewed diet for hyperemesis with pt. She was very familiar with diet. Copy of AND diet eduction for hyperemesis provided to pt  Elisabeth CaraKatherine Ashwath Lasch M.Odis LusterEd. R.D. LDN Neonatal Nutrition Support Specialist/RD III Pager [phone]

## 2013-12-21 NOTE — Progress Notes (Signed)
Discharge teaching complete. Pt understood instructions and did not have any questions. Pt ambulated out of the hospital and discharged home to family.  

## 2013-12-21 NOTE — Discharge Summary (Signed)
Antenatal Physician Discharge Summary  Patient ID: [name] MRN: [id number] DOB/AGE: November 08, 1980 33 y.o.  Admit date: 12/19/2013 Discharge date: 12/21/2013  Admission Diagnoses: Hyperemesis, Chronic HTN  Discharge Diagnoses: The same  Prenatal Procedures: None  Significant Diagnostic Studies:  Results for orders placed during the hospital encounter of 12/19/13 (from the past 168 hour(s))  AMYLASE   Collection Time    12/17/13  7:20 PM      Result Value Ref Range   Amylase 52  0 - 105 U/L  URINALYSIS, ROUTINE W REFLEX MICROSCOPIC   Collection Time    12/19/13  2:05 PM      Result Value Ref Range   Color, Urine YELLOW  YELLOW   APPearance CLEAR  CLEAR   Specific Gravity, Urine >1.030 (*) 1.005 - 1.030   pH 6.5  5.0 - 8.0   Glucose, UA NEGATIVE  NEGATIVE mg/dL   Hgb urine dipstick NEGATIVE  NEGATIVE   Bilirubin Urine SMALL (*) NEGATIVE   Ketones, ur >80 (*) NEGATIVE mg/dL   Protein, ur 409100 (*) NEGATIVE mg/dL   Urobilinogen, UA 4.0 (*) 0.0 - 1.0 mg/dL   Nitrite NEGATIVE  NEGATIVE   Leukocytes, UA NEGATIVE  NEGATIVE  URINE MICROSCOPIC-ADD ON   Collection Time    12/19/13  2:05 PM      Result Value Ref Range   Squamous Epithelial / LPF MANY (*) RARE   WBC, UA 3-6  <3 WBC/hpf   Urine-Other MUCOUS PRESENT    URINE RAPID DRUG SCREEN (HOSP PERFORMED)   Collection Time    12/19/13 10:10 PM      Result Value Ref Range   Opiates NONE DETECTED  NONE DETECTED   Cocaine NONE DETECTED  NONE DETECTED   Benzodiazepines NONE DETECTED  NONE DETECTED   Amphetamines NONE DETECTED  NONE DETECTED   Tetrahydrocannabinol POSITIVE (*) NONE DETECTED   Barbiturates NONE DETECTED  NONE DETECTED  GLUCOSE, CAPILLARY   Collection Time    12/21/13  5:03 AM      Result Value Ref Range   Glucose-Capillary 110 (*) 70 - 99 mg/dL  GLUCOSE, RANDOM   Collection Time    12/21/13  5:40 AM      Result Value Ref Range   Glucose, Bld 106 (*) 70 - 99 mg/dL  CBC   Collection Time    12/21/13   2:20 PM      Result Value Ref Range   WBC 9.0  4.0 - 10.5 K/uL   RBC 3.04 (*) 3.87 - 5.11 MIL/uL   Hemoglobin 9.2 (*) 12.0 - 15.0 g/dL   HCT 81.127.0 (*) 91.436.0 - 78.246.0 %   MCV 88.8  78.0 - 100.0 fL   MCH 30.3  26.0 - 34.0 pg   MCHC 34.1  30.0 - 36.0 g/dL   RDW 95.613.4  21.311.5 - 08.615.5 %   Platelets 214  150 - 400 K/uL  COMPREHENSIVE METABOLIC PANEL   Collection Time    12/21/13  2:20 PM      Result Value Ref Range   Sodium 137  137 - 147 mEq/L   Potassium 2.9 (*) 3.7 - 5.3 mEq/L   Chloride 105  96 - 112 mEq/L   CO2 22  19 - 32 mEq/L   Glucose, Bld 112 (*) 70 - 99 mg/dL   BUN 3 (*) 6 - 23 mg/dL   Creatinine, Ser 5.780.49 (*) 0.50 - 1.10 mg/dL   Calcium 8.5  8.4 - 46.910.5 mg/dL   Total Protein  6.4  6.0 - 8.3 g/dL   Albumin 2.7 (*) 3.5 - 5.2 g/dL   AST 14  0 - 37 U/L   ALT 15  0 - 35 U/L   Alkaline Phosphatase 62  39 - 117 U/L   Total Bilirubin 0.2 (*) 0.3 - 1.2 mg/dL   GFR calc non Af Amer >90  >90 mL/min   GFR calc Af Amer >90  >90 mL/min   Anion gap 10  5 - 15  Results for orders placed during the hospital encounter of 12/18/13 (from the past 168 hour(s))  OCCULT BLOOD GASTRIC / DUODENUM (SPECIMEN CUP)   Collection Time    12/18/13  6:30 AM      Result Value Ref Range   pH, Gastric NOT DONE     Occult Blood, Gastric POSITIVE (*) NEGATIVE  URINALYSIS, ROUTINE W REFLEX MICROSCOPIC   Collection Time    12/18/13  7:48 AM      Result Value Ref Range   Color, Urine YELLOW  YELLOW   APPearance CLEAR  CLEAR   Specific Gravity, Urine 1.020  1.005 - 1.030   pH 6.5  5.0 - 8.0   Glucose, UA NEGATIVE  NEGATIVE mg/dL   Hgb urine dipstick NEGATIVE  NEGATIVE   Bilirubin Urine NEGATIVE  NEGATIVE   Ketones, ur >80 (*) NEGATIVE mg/dL   Protein, ur NEGATIVE  NEGATIVE mg/dL   Urobilinogen, UA 0.2  0.0 - 1.0 mg/dL   Nitrite NEGATIVE  NEGATIVE   Leukocytes, UA NEGATIVE  NEGATIVE  URINE RAPID DRUG SCREEN (HOSP PERFORMED)   Collection Time    12/18/13  7:49 AM      Result Value Ref Range   Opiates NONE  DETECTED  NONE DETECTED   Cocaine NONE DETECTED  NONE DETECTED   Benzodiazepines NONE DETECTED  NONE DETECTED   Amphetamines NONE DETECTED  NONE DETECTED   Tetrahydrocannabinol POSITIVE (*) NONE DETECTED   Barbiturates NONE DETECTED  NONE DETECTED  Results for orders placed during the hospital encounter of 12/17/13 (from the past 168 hour(s))  URINALYSIS, ROUTINE W REFLEX MICROSCOPIC   Collection Time    12/17/13 11:44 AM      Result Value Ref Range   Color, Urine YELLOW  YELLOW   APPearance CLEAR  CLEAR   Specific Gravity, Urine >1.030 (*) 1.005 - 1.030   pH 6.0  5.0 - 8.0   Glucose, UA NEGATIVE  NEGATIVE mg/dL   Hgb urine dipstick NEGATIVE  NEGATIVE   Bilirubin Urine NEGATIVE  NEGATIVE   Ketones, ur 40 (*) NEGATIVE mg/dL   Protein, ur 161 (*) NEGATIVE mg/dL   Urobilinogen, UA 0.2  0.0 - 1.0 mg/dL   Nitrite NEGATIVE  NEGATIVE   Leukocytes, UA NEGATIVE  NEGATIVE  URINE MICROSCOPIC-ADD ON   Collection Time    12/17/13 11:44 AM      Result Value Ref Range   Squamous Epithelial / LPF RARE  RARE   WBC, UA 0-2  <3 WBC/hpf  CBC WITH DIFFERENTIAL   Collection Time    12/17/13  7:20 PM      Result Value Ref Range   WBC 10.5  4.0 - 10.5 K/uL   RBC 3.23 (*) 3.87 - 5.11 MIL/uL   Hemoglobin 9.8 (*) 12.0 - 15.0 g/dL   HCT 09.6 (*) 04.5 - 40.9 %   MCV 88.5  78.0 - 100.0 fL   MCH 30.3  26.0 - 34.0 pg   MCHC 34.3  30.0 - 36.0 g/dL  RDW 13.4  11.5 - 15.5 %   Platelets 218  150 - 400 K/uL   Neutrophils Relative % 85 (*) 43 - 77 %   Neutro Abs 9.0 (*) 1.7 - 7.7 K/uL   Lymphocytes Relative 10 (*) 12 - 46 %   Lymphs Abs 1.1  0.7 - 4.0 K/uL   Monocytes Relative 5  3 - 12 %   Monocytes Absolute 0.5  0.1 - 1.0 K/uL   Eosinophils Relative 0  0 - 5 %   Eosinophils Absolute 0.0  0.0 - 0.7 K/uL   Basophils Relative 0  0 - 1 %   Basophils Absolute 0.0  0.0 - 0.1 K/uL  COMPREHENSIVE METABOLIC PANEL   Collection Time    12/17/13  7:20 PM      Result Value Ref Range   Sodium 139  137 - 147  mEq/L   Potassium 3.8  3.7 - 5.3 mEq/L   Chloride 105  96 - 112 mEq/L   CO2 22  19 - 32 mEq/L   Glucose, Bld 111 (*) 70 - 99 mg/dL   BUN 9  6 - 23 mg/dL   Creatinine, Ser 1.610.56  0.50 - 1.10 mg/dL   Calcium 9.0  8.4 - 09.610.5 mg/dL   Total Protein 6.3  6.0 - 8.3 g/dL   Albumin 2.8 (*) 3.5 - 5.2 g/dL   AST 13  0 - 37 U/L   ALT 11  0 - 35 U/L   Alkaline Phosphatase 66  39 - 117 U/L   Total Bilirubin <0.2 (*) 0.3 - 1.2 mg/dL   GFR calc non Af Amer >90  >90 mL/min   GFR calc Af Amer >90  >90 mL/min   Anion gap 12  5 - 15    Treatments: Antiemetics, Medrol taper  Hospital Course:  This is a 33 y.o. [id number] with IUP at 6421w5d admitted for hyperemesis. She has been seen multiple times this pregnancy for this complaint.  Was treated with antiemetics, Medrol taper and was tolerating clear liquids at time of discharge,. Declines trial of regular diet;  "I have to go home now" she said.   Noted to have BP with systolics in 140s, no preeclampsia symptoms, labs negative, urine Pr:Cr pending at time of discharge.  Preeclampsia precautions reviewed.  Fetal heart rate monitoring remained reassuring, and she had no signs/symptoms of preterm labor or other maternal-fetal concerns.  She was deemed stable for discharge to home with outpatient follow up; scheduled for appointment on 12/26/13.  Discharge Exam: BP 143/84  Pulse 94  Temp(Src) 98.8 F (37.1 C) (Oral)  Resp 18  Ht 5\' 3"  (1.6 m)  Wt 193 lb 0.1 oz (87.546 kg)  BMI 34.20 kg/m2  SpO2 99%  LMP 07/01/2013 General appearance: alert and no distress GI: soft, non-tender; bowel sounds normal; no masses,  no organomegaly Pelvic: deferred Extremities: extremities normal, atraumatic, no cyanosis or edema and Homans sign is negative, no sign of DVT  Discharge Condition: Stable  Disposition: 01-Home or Self Care     Medication List    STOP taking these medications       promethazine 25 MG suppository  Commonly known as:  PHENERGAN  Replaced by:   promethazine 25 MG tablet     ranitidine 150 MG tablet  Commonly known as:  ZANTAC      TAKE these medications       methylPREDNISolone 4 MG tablet  Commonly known as:  MEDROL  Take as instructed on  accompanying taper sheet     omeprazole 40 MG capsule  Commonly known as:  PRILOSEC  Take 1 capsule (40 mg total) by mouth daily.     ondansetron 4 MG tablet  Commonly known as:  ZOFRAN  Take 1 tablet (4 mg total) by mouth every 6 (six) hours.     potassium chloride SA 20 MEQ tablet  Commonly known as:  K-DUR,KLOR-CON  Take 2 tablets (40 mEq total) by mouth daily.     prenatal multivitamin Tabs tablet  Take 1 tablet by mouth daily at 12 noon.     promethazine 25 MG tablet  Commonly known as:  PHENERGAN  Take 1 tablet (25 mg total) by mouth every 6 (six) hours as needed for nausea or vomiting.     sertraline 100 MG tablet  Commonly known as:  ZOLOFT  Take 1 tablet (100 mg total) by mouth daily.           Follow-up Information   Follow up with [facility] On 12/26/2013. (As scheduled.  Call clinic/come to MAU for any concerning issues)    Contact information:   [address] [place] [address] [phone]      Signed: Jaynie Collins A M.D. 12/21/2013, 3:59 PM

## 2013-12-21 NOTE — Discharge Instructions (Signed)
Hyperemesis Gravidarum Hyperemesis gravidarum is a severe form of nausea and vomiting that happens during pregnancy. Hyperemesis is worse than morning sickness. It may cause you to have nausea or vomiting all day for many days. It may keep you from eating and drinking enough food and liquids. Hyperemesis usually occurs during the first half (the first 20 weeks) of pregnancy. It often goes away once a woman is in her second half of pregnancy. However, sometimes hyperemesis continues through an entire pregnancy.  CAUSES  The cause of this condition is not completely known but is thought to be related to changes in the body's hormones when pregnant. It could be from the high level of the pregnancy hormone or an increase in estrogen in the body.  SIGNS AND SYMPTOMS   Severe nausea and vomiting.  Nausea that does not go away.  Vomiting that does not allow you to keep any food down.  Weight loss and body fluid loss (dehydration).  Having no desire to eat or not liking food you have previously enjoyed. DIAGNOSIS  Your health care provider will do a physical exam and ask you about your symptoms. He or she may also order blood tests and urine tests to make sure something else is not causing the problem.  TREATMENT  You may only need medicine to control the problem. If medicines do not control the nausea and vomiting, you will be treated in the hospital to prevent dehydration, increased acid in the blood (acidosis), weight loss, and changes in the electrolytes in your body that may harm the unborn baby (fetus). You may need IV fluids.  HOME CARE INSTRUCTIONS   Only take over-the-counter or prescription medicines as directed by your health care provider.  Try eating a couple of dry crackers or toast in the morning before getting out of bed.  Avoid foods and smells that upset your stomach.  Avoid fatty and spicy foods.  Eat 5-6 small meals a day.  Do not drink when eating meals. Drink between  meals.  For snacks, eat high-protein foods, such as cheese.  Eat or suck on things that have ginger in them. Ginger helps nausea.  Avoid food preparation. The smell of food can spoil your appetite.  Avoid iron pills and iron in your multivitamins until after 3-4 months of being pregnant. However, consult with your health care provider before stopping any prescribed iron pills. SEEK MEDICAL CARE IF:   Your abdominal pain increases.  You have a severe headache.  You have vision problems.  You are losing weight. SEEK IMMEDIATE MEDICAL CARE IF:   You are unable to keep fluids down.  You vomit blood.  You have constant nausea and vomiting.  You have excessive weakness.  You have extreme thirst.  You have dizziness or fainting.  You have a fever or persistent symptoms for more than 2-3 days.  You have a fever and your symptoms suddenly get worse. MAKE SURE YOU:   Understand these instructions.  Will watch your condition.  Will get help right away if you are not doing well or get worse. Document Released: 02/22/2005 Document Revised: 12/13/2012 Document Reviewed: 10/04/2012 [facility] Patient Information 2015 [place], [place]. This information is not intended to replace advice given to you by your health care provider. Make sure you discuss any questions you have with your health care provider.   Preeclampsia and Eclampsia Preeclampsia is a serious condition that develops only during pregnancy. It is also called toxemia of pregnancy. This condition causes high blood  pressure along with other symptoms, such as swelling and headaches. These may develop as the condition gets worse. Preeclampsia may occur 20 weeks or later into your pregnancy.  Diagnosing and treating preeclampsia early is very important. If not treated early, it can cause serious problems for you and your baby. One problem it can lead to is eclampsia, which is a condition that causes muscle jerking or shaking  (convulsions) in the mother. Delivering your baby is the best treatment for preeclampsia or eclampsia.  RISK FACTORS The cause of preeclampsia is not known. You may be more likely to develop preeclampsia if you have certain risk factors. These include:   Being pregnant for the first time.  Having preeclampsia in a past pregnancy.  Having a family history of preeclampsia.  Having high blood pressure.  Being pregnant with twins or triplets.  Being 66 or older.  Being African American.  Having kidney disease or diabetes.  Having medical conditions such as lupus or blood diseases.  Being very overweight (obese). SIGNS AND SYMPTOMS  The earliest signs of preeclampsia are:  High blood pressure.  Increased protein in your urine. Your health care provider will check for this at every prenatal visit. Other symptoms that can develop include:   Severe headaches.  Sudden weight gain.  Swelling of your hands, face, legs, and feet.  Feeling sick to your stomach (nauseous) and throwing up (vomiting).  Vision problems (blurred or double vision).  Numbness in your face, arms, legs, and feet.  Dizziness.  Slurred speech.  Sensitivity to bright lights.  Abdominal pain. DIAGNOSIS  There are no screening tests for preeclampsia. Your health care provider will ask you about symptoms and check for signs of preeclampsia during your prenatal visits. You may also have tests, including:  Urine testing.  Blood testing.  Checking your baby's heart rate.  Checking the health of your baby and your placenta using images created with sound waves (ultrasound). TREATMENT  You can work out the best treatment approach together with your health care provider. It is very important to keep all prenatal appointments. If you have an increased risk of preeclampsia, you may need more frequent prenatal exams.  Your health care provider may prescribe bed rest.  You may have to eat as little salt as  possible.  You may need to take medicine to lower your blood pressure if the condition does not respond to more conservative measures.  You may need to stay in the hospital if your condition is severe. There, treatment will focus on controlling your blood pressure and fluid retention. You may also need to take medicine to prevent seizures.  If the condition gets worse, your baby may need to be delivered early to protect you and the baby. You may have your labor started with medicine (be induced), or you may have a cesarean delivery.  Preeclampsia usually goes away after the baby is born. HOME CARE INSTRUCTIONS   Only take over-the-counter or prescription medicines as directed by your health care provider.  Lie on your left side while resting. This keeps pressure off your baby.  Elevate your feet while resting.  Get regular exercise. Ask your health care provider what type of exercise is safe for you.  Avoid caffeine and alcohol.  Do not smoke.  Drink 6-8 glasses of water every day.  Eat a balanced diet that is low in salt. Do not add salt to your food.  Avoid stressful situations as much as possible.  Get plenty of  rest and sleep.  Keep all prenatal appointments and tests as scheduled. SEEK MEDICAL CARE IF:  You are gaining more weight than expected.  You have any headaches, abdominal pain, or nausea.  You are bruising more than usual.  You feel dizzy or light-headed. SEEK IMMEDIATE MEDICAL CARE IF:   You develop sudden or severe swelling anywhere in your body. This usually happens in the legs.  You gain 5 lb (2.3 kg) or more in a week.  You have a severe headache, dizziness, problems with your vision, or confusion.  You have severe abdominal pain.  You have lasting nausea or vomiting.  You have a seizure.  You have trouble moving any part of your body.  You develop numbness in your body.  You have trouble speaking.  You have any abnormal bleeding.  You  develop a stiff neck.  You pass out. MAKE SURE YOU:   Understand these instructions.  Will watch your condition.  Will get help right away if you are not doing well or get worse. Document Released: 02/20/2000 Document Revised: 02/27/2013 Document Reviewed: 12/15/2012 [facility]Exit[facility] Patient Information 2015 [place] HeightsExitCare, MarylandLLC. This information is not intended to replace advice given to you by your health care provider. Make sure you discuss any questions you have with your health care provider.

## 2013-12-26 ENCOUNTER — Encounter: Payer: Self-pay | Admitting: Advanced Practice Midwife

## 2013-12-26 ENCOUNTER — Encounter: Payer: Self-pay | Admitting: Obstetrics and Gynecology

## 2013-12-26 ENCOUNTER — Ambulatory Visit (INDEPENDENT_AMBULATORY_CARE_PROVIDER_SITE_OTHER): Payer: Medicaid Other | Admitting: Advanced Practice Midwife

## 2013-12-26 VITALS — BP 138/78 | HR 91 | Wt 192.0 lb

## 2013-12-26 DIAGNOSIS — O210 Mild hyperemesis gravidarum: Secondary | ICD-10-CM

## 2013-12-26 DIAGNOSIS — R87612 Low grade squamous intraepithelial lesion on cytologic smear of cervix (LGSIL): Secondary | ICD-10-CM

## 2013-12-26 DIAGNOSIS — O10912 Unspecified pre-existing hypertension complicating pregnancy, second trimester: Secondary | ICD-10-CM

## 2013-12-26 DIAGNOSIS — O358XX0 Maternal care for other (suspected) fetal abnormality and damage, not applicable or unspecified: Secondary | ICD-10-CM | POA: Insufficient documentation

## 2013-12-26 DIAGNOSIS — O35BXX0 Maternal care for other (suspected) fetal abnormality and damage, fetal cardiac anomalies, not applicable or unspecified: Secondary | ICD-10-CM | POA: Insufficient documentation

## 2013-12-26 LAB — POCT URINALYSIS DIP (DEVICE)
BILIRUBIN URINE: NEGATIVE
Glucose, UA: NEGATIVE mg/dL
Hgb urine dipstick: NEGATIVE
Ketones, ur: NEGATIVE mg/dL
Nitrite: NEGATIVE
PROTEIN: NEGATIVE mg/dL
SPECIFIC GRAVITY, URINE: 1.02 (ref 1.005–1.030)
Urobilinogen, UA: 1 mg/dL (ref 0.0–1.0)
pH: 7 (ref 5.0–8.0)

## 2013-12-26 NOTE — Progress Notes (Signed)
Colpo no Bx. 28 week labs at [facility]. Growth US scheduled in 6 weeks per MFM.

## 2013-12-26 NOTE — Progress Notes (Signed)
[facility] needs [facility] stating that she can have whole milk.  28week labs at next visit.

## 2013-12-26 NOTE — Patient Instructions (Signed)

## 2014-01-03 ENCOUNTER — Other Ambulatory Visit (HOSPITAL_COMMUNITY): Payer: Self-pay | Admitting: Maternal and Fetal Medicine

## 2014-01-03 DIAGNOSIS — Z8751 Personal history of pre-term labor: Secondary | ICD-10-CM

## 2014-01-03 DIAGNOSIS — Q249 Congenital malformation of heart, unspecified: Secondary | ICD-10-CM

## 2014-01-03 DIAGNOSIS — O10012 Pre-existing essential hypertension complicating pregnancy, second trimester: Secondary | ICD-10-CM

## 2014-01-03 DIAGNOSIS — Z9889 Other specified postprocedural states: Secondary | ICD-10-CM

## 2014-01-03 DIAGNOSIS — O09292 Supervision of pregnancy with other poor reproductive or obstetric history, second trimester: Secondary | ICD-10-CM

## 2014-01-03 DIAGNOSIS — O3442 Maternal care for other abnormalities of cervix, second trimester: Secondary | ICD-10-CM

## 2014-01-07 ENCOUNTER — Encounter (HOSPITAL_COMMUNITY): Payer: Self-pay | Admitting: *Deleted

## 2014-01-16 ENCOUNTER — Encounter: Payer: Medicaid Other | Admitting: Advanced Practice Midwife

## 2014-01-16 ENCOUNTER — Encounter: Payer: Self-pay | Admitting: Obstetrics and Gynecology

## 2014-01-23 ENCOUNTER — Ambulatory Visit (HOSPITAL_COMMUNITY)
Admission: RE | Admit: 2014-01-23 | Discharge: 2014-01-23 | Disposition: A | Discharge status: Home / Self Care | Payer: Medicaid Other | Source: Ambulatory Visit | Attending: Obstetrics and Gynecology | Admitting: Obstetrics and Gynecology

## 2014-01-23 DIAGNOSIS — F1210 Cannabis abuse, uncomplicated: Secondary | ICD-10-CM | POA: Diagnosis not present

## 2014-01-23 DIAGNOSIS — Z3A29 29 weeks gestation of pregnancy: Secondary | ICD-10-CM | POA: Insufficient documentation

## 2014-01-23 DIAGNOSIS — O10013 Pre-existing essential hypertension complicating pregnancy, third trimester: Secondary | ICD-10-CM | POA: Insufficient documentation

## 2014-01-23 DIAGNOSIS — O99323 Drug use complicating pregnancy, third trimester: Secondary | ICD-10-CM | POA: Insufficient documentation

## 2014-01-23 DIAGNOSIS — O10012 Pre-existing essential hypertension complicating pregnancy, second trimester: Secondary | ICD-10-CM

## 2014-01-23 DIAGNOSIS — O09299 Supervision of pregnancy with other poor reproductive or obstetric history, unspecified trimester: Secondary | ICD-10-CM | POA: Insufficient documentation

## 2014-01-23 DIAGNOSIS — O3429 Maternal care due to uterine scar from other previous surgery: Secondary | ICD-10-CM | POA: Insufficient documentation

## 2014-01-23 DIAGNOSIS — Z9889 Other specified postprocedural states: Secondary | ICD-10-CM

## 2014-01-23 DIAGNOSIS — O3442 Maternal care for other abnormalities of cervix, second trimester: Secondary | ICD-10-CM

## 2014-01-23 DIAGNOSIS — O09213 Supervision of pregnancy with history of pre-term labor, third trimester: Secondary | ICD-10-CM | POA: Diagnosis not present

## 2014-01-23 DIAGNOSIS — O09293 Supervision of pregnancy with other poor reproductive or obstetric history, third trimester: Secondary | ICD-10-CM

## 2014-01-23 DIAGNOSIS — Z8751 Personal history of pre-term labor: Secondary | ICD-10-CM | POA: Insufficient documentation

## 2014-01-23 DIAGNOSIS — Q249 Congenital malformation of heart, unspecified: Secondary | ICD-10-CM | POA: Insufficient documentation

## 2014-01-23 DIAGNOSIS — O10019 Pre-existing essential hypertension complicating pregnancy, unspecified trimester: Secondary | ICD-10-CM | POA: Insufficient documentation

## 2014-01-23 DIAGNOSIS — O09292 Supervision of pregnancy with other poor reproductive or obstetric history, second trimester: Secondary | ICD-10-CM

## 2014-02-06 ENCOUNTER — Ambulatory Visit (INDEPENDENT_AMBULATORY_CARE_PROVIDER_SITE_OTHER): Payer: Medicaid Other | Admitting: Obstetrics and Gynecology

## 2014-02-06 ENCOUNTER — Encounter: Payer: Self-pay | Admitting: Obstetrics and Gynecology

## 2014-02-06 VITALS — BP 113/75 | HR 94 | Temp 98.2°F | Wt 194.1 lb

## 2014-02-06 DIAGNOSIS — Z3493 Encounter for supervision of normal pregnancy, unspecified, third trimester: Secondary | ICD-10-CM

## 2014-02-06 DIAGNOSIS — O99340 Other mental disorders complicating pregnancy, unspecified trimester: Secondary | ICD-10-CM

## 2014-02-06 DIAGNOSIS — F329 Major depressive disorder, single episode, unspecified: Secondary | ICD-10-CM

## 2014-02-06 DIAGNOSIS — Z23 Encounter for immunization: Secondary | ICD-10-CM

## 2014-02-06 DIAGNOSIS — F1990 Other psychoactive substance use, unspecified, uncomplicated: Secondary | ICD-10-CM

## 2014-02-06 LAB — POCT URINALYSIS DIP (DEVICE)
GLUCOSE, UA: NEGATIVE mg/dL
HGB URINE DIPSTICK: NEGATIVE
KETONES UR: NEGATIVE mg/dL
Nitrite: NEGATIVE
Protein, ur: 100 mg/dL — AB
UROBILINOGEN UA: 2 mg/dL — AB (ref 0.0–1.0)
pH: 6 (ref 5.0–8.0)

## 2014-02-06 MED ORDER — TETANUS-DIPHTH-ACELL PERTUSSIS 5-2.5-18.5 LF-MCG/0.5 IM SUSP
0.5000 mL | Freq: Once | INTRAMUSCULAR | Status: AC
  Administered 2014-02-06: 0.5 mL via INTRAMUSCULAR

## 2014-02-06 NOTE — Progress Notes (Signed)
28 wk labs done. Tdap. UDS F/U done. RLP discussed. On antiemetics, Zantac, BASA. N/V controlled. Zoloft effective. Watch S:D.

## 2014-02-06 NOTE — Progress Notes (Signed)
C/o tenderness in hips.

## 2014-02-06 NOTE — Patient Instructions (Signed)
Third Trimester of Pregnancy The third trimester is from week 29 through week 42, months 7 through 9. The third trimester is a time when the fetus is growing rapidly. At the end of the ninth month, the fetus is about 20 inches in length and weighs 6-10 pounds.  BODY CHANGES Your body goes through many changes during pregnancy. The changes vary from woman to woman.   Your weight will continue to increase. You can expect to gain 25-35 pounds (11-16 kg) by the end of the pregnancy.  You may begin to get stretch marks on your hips, abdomen, and breasts.  You may urinate more often because the fetus is moving lower into your pelvis and pressing on your bladder.  You may develop or continue to have heartburn as a result of your pregnancy.  You may develop constipation because certain hormones are causing the muscles that push waste through your intestines to slow down.  You may develop hemorrhoids or swollen, bulging veins (varicose veins).  You may have pelvic pain because of the weight gain and pregnancy hormones relaxing your joints between the bones in your pelvis. Backaches may result from overexertion of the muscles supporting your posture.  You may have changes in your hair. These can include thickening of your hair, rapid growth, and changes in texture. Some women also have hair loss during or after pregnancy, or hair that feels dry or thin. Your hair will most likely return to normal after your baby is born.  Your breasts will continue to grow and be tender. A yellow discharge may leak from your breasts called colostrum.  Your belly button may stick out.  You may feel short of breath because of your expanding uterus.  You may notice the fetus "dropping," or moving lower in your abdomen.  You may have a bloody mucus discharge. This usually occurs a few days to a week before labor begins.  Your cervix becomes thin and soft (effaced) near your due date. WHAT TO EXPECT AT YOUR PRENATAL  EXAMS  You will have prenatal exams every 2 weeks until week 36. Then, you will have weekly prenatal exams. During a routine prenatal visit:  You will be weighed to make sure you and the fetus are growing normally.  Your blood pressure is taken.  Your abdomen will be measured to track your baby's growth.  The fetal heartbeat will be listened to.  Any test results from the previous visit will be discussed.  You may have a cervical check near your due date to see if you have effaced. At around 36 weeks, your caregiver will check your cervix. At the same time, your caregiver will also perform a test on the secretions of the vaginal tissue. This test is to determine if a type of bacteria, Group B streptococcus, is present. Your caregiver will explain this further. Your caregiver may ask you:  What your birth plan is.  How you are feeling.  If you are feeling the baby move.  If you have had any abnormal symptoms, such as leaking fluid, bleeding, severe headaches, or abdominal cramping.  If you have any questions. Other tests or screenings that may be performed during your third trimester include:  Blood tests that check for low iron levels (anemia).  Fetal testing to check the health, activity level, and growth of the fetus. Testing is done if you have certain medical conditions or if there are problems during the pregnancy. FALSE LABOR You may feel small, irregular contractions that   eventually go away. These are called Braxton Hicks contractions, or false labor. Contractions may last for hours, days, or even weeks before true labor sets in. If contractions come at regular intervals, intensify, or become painful, it is best to be seen by your caregiver.  SIGNS OF LABOR   Menstrual-like cramps.  Contractions that are 5 minutes apart or less.  Contractions that start on the top of the uterus and spread down to the lower abdomen and back.  A sense of increased pelvic pressure or back  pain.  A watery or bloody mucus discharge that comes from the vagina. If you have any of these signs before the 37th week of pregnancy, call your caregiver right away. You need to go to the hospital to get checked immediately. HOME CARE INSTRUCTIONS   Avoid all smoking, herbs, alcohol, and unprescribed drugs. These chemicals affect the formation and growth of the baby.  Follow your caregiver's instructions regarding medicine use. There are medicines that are either safe or unsafe to take during pregnancy.  Exercise only as directed by your caregiver. Experiencing uterine cramps is a good sign to stop exercising.  Continue to eat regular, healthy meals.  Wear a good support bra for breast tenderness.  Do not use hot tubs, steam rooms, or saunas.  Wear your seat belt at all times when driving.  Avoid raw meat, uncooked cheese, cat litter boxes, and soil used by cats. These carry germs that can cause birth defects in the baby.  Take your prenatal vitamins.  Try taking a stool softener (if your caregiver approves) if you develop constipation. Eat more high-fiber foods, such as fresh vegetables or fruit and whole grains. Drink plenty of fluids to keep your urine clear or pale yellow.  Take warm sitz baths to soothe any pain or discomfort caused by hemorrhoids. Use hemorrhoid cream if your caregiver approves.  If you develop varicose veins, wear support hose. Elevate your feet for 15 minutes, 3-4 times a day. Limit salt in your diet.  Avoid heavy lifting, wear low heal shoes, and practice good posture.  Rest a lot with your legs elevated if you have leg cramps or low back pain.  Visit your dentist if you have not gone during your pregnancy. Use a soft toothbrush to brush your teeth and be gentle when you floss.  A sexual relationship may be continued unless your caregiver directs you otherwise.  Do not travel far distances unless it is absolutely necessary and only with the approval  of your caregiver.  Take prenatal classes to understand, practice, and ask questions about the labor and delivery.  Make a trial run to the hospital.  Pack your hospital bag.  Prepare the baby's nursery.  Continue to go to all your prenatal visits as directed by your caregiver. SEEK MEDICAL CARE IF:  You are unsure if you are in labor or if your water has broken.  You have dizziness.  You have mild pelvic cramps, pelvic pressure, or nagging pain in your abdominal area.  You have persistent nausea, vomiting, or diarrhea.  You have a bad smelling vaginal discharge.  You have pain with urination. SEEK IMMEDIATE MEDICAL CARE IF:   You have a fever.  You are leaking fluid from your vagina.  You have spotting or bleeding from your vagina.  You have severe abdominal cramping or pain.  You have rapid weight loss or gain.  You have shortness of breath with chest pain.  You notice sudden or extreme swelling   of your face, hands, ankles, feet, or legs.  You have not felt your baby move in over an hour.  You have severe headaches that do not go away with medicine.  You have vision changes. Document Released: 02/16/2001 Document Revised: 02/27/2013 Document Reviewed: 04/25/2012 [employer] Patient Information 2015 [employer], [employer]. This information is not intended to replace advice given to you by your health care provider. Make sure you discuss any questions you have with your health care provider.  

## 2014-02-07 LAB — GLUCOSE TOLERANCE, 1 HOUR (50G) W/O FASTING: GLUCOSE 1 HOUR GTT: 103 mg/dL (ref 70–140)

## 2014-02-07 LAB — CBC
HEMATOCRIT: 28.4 % — AB (ref 36.0–46.0)
Hemoglobin: 9.5 g/dL — ABNORMAL LOW (ref 12.0–15.0)
MCH: 29.5 pg (ref 26.0–34.0)
MCHC: 33.5 g/dL (ref 30.0–36.0)
MCV: 88.2 fL (ref 78.0–100.0)
MPV: 11.7 fL (ref 9.4–12.4)
Platelets: 251 10*3/uL (ref 150–400)
RBC: 3.22 MIL/uL — AB (ref 3.87–5.11)
RDW: 13.7 % (ref 11.5–15.5)
WBC: 8.7 10*3/uL (ref 4.0–10.5)

## 2014-02-07 LAB — RPR

## 2014-02-07 LAB — HIV ANTIBODY (ROUTINE TESTING W REFLEX): HIV: NONREACTIVE

## 2014-02-09 LAB — DRUG SCREEN, URINE
Amphetamine Screen, Ur: NEGATIVE
BENZODIAZEPINES.: NEGATIVE
Barbiturate Quant, Ur: NEGATIVE
CREATININE, U: 425.73 mg/dL
Cocaine Metabolites: NEGATIVE
Marijuana Metabolite: POSITIVE — AB
Methadone: NEGATIVE
Opiates: NEGATIVE
PHENCYCLIDINE (PCP): NEGATIVE
Propoxyphene: NEGATIVE

## 2014-02-19 ENCOUNTER — Encounter: Payer: Self-pay | Admitting: *Deleted

## 2014-02-20 ENCOUNTER — Ambulatory Visit (HOSPITAL_COMMUNITY)
Admission: RE | Admit: 2014-02-20 | Discharge: 2014-02-20 | Disposition: A | Discharge status: Home / Self Care | Payer: Medicaid Other | Source: Ambulatory Visit | Attending: Obstetrics & Gynecology | Admitting: Obstetrics & Gynecology

## 2014-02-20 ENCOUNTER — Encounter: Payer: Medicaid Other | Admitting: Obstetrics and Gynecology

## 2014-02-20 ENCOUNTER — Ambulatory Visit (INDEPENDENT_AMBULATORY_CARE_PROVIDER_SITE_OTHER): Payer: Medicaid Other | Admitting: Obstetrics and Gynecology

## 2014-02-20 ENCOUNTER — Encounter (HOSPITAL_COMMUNITY): Payer: Self-pay

## 2014-02-20 ENCOUNTER — Encounter: Payer: Self-pay | Admitting: Obstetrics and Gynecology

## 2014-02-20 VITALS — BP 124/76 | HR 86 | Temp 97.7°F | Wt 200.2 lb

## 2014-02-20 DIAGNOSIS — O10913 Unspecified pre-existing hypertension complicating pregnancy, third trimester: Secondary | ICD-10-CM

## 2014-02-20 DIAGNOSIS — O3429 Maternal care due to uterine scar from other previous surgery: Secondary | ICD-10-CM | POA: Insufficient documentation

## 2014-02-20 DIAGNOSIS — O3443 Maternal care for other abnormalities of cervix, third trimester: Secondary | ICD-10-CM

## 2014-02-20 DIAGNOSIS — Z3A33 33 weeks gestation of pregnancy: Secondary | ICD-10-CM | POA: Insufficient documentation

## 2014-02-20 DIAGNOSIS — O99323 Drug use complicating pregnancy, third trimester: Secondary | ICD-10-CM | POA: Insufficient documentation

## 2014-02-20 DIAGNOSIS — Z9889 Other specified postprocedural states: Secondary | ICD-10-CM

## 2014-02-20 DIAGNOSIS — O10912 Unspecified pre-existing hypertension complicating pregnancy, second trimester: Secondary | ICD-10-CM

## 2014-02-20 DIAGNOSIS — O09293 Supervision of pregnancy with other poor reproductive or obstetric history, third trimester: Secondary | ICD-10-CM

## 2014-02-20 DIAGNOSIS — O10013 Pre-existing essential hypertension complicating pregnancy, third trimester: Secondary | ICD-10-CM | POA: Insufficient documentation

## 2014-02-20 DIAGNOSIS — O09213 Supervision of pregnancy with history of pre-term labor, third trimester: Secondary | ICD-10-CM

## 2014-02-20 DIAGNOSIS — Z8751 Personal history of pre-term labor: Secondary | ICD-10-CM

## 2014-02-20 DIAGNOSIS — F1290 Cannabis use, unspecified, uncomplicated: Secondary | ICD-10-CM | POA: Diagnosis not present

## 2014-02-20 LAB — POCT URINALYSIS DIP (DEVICE)
Bilirubin Urine: NEGATIVE
GLUCOSE, UA: NEGATIVE mg/dL
Hgb urine dipstick: NEGATIVE
Ketones, ur: NEGATIVE mg/dL
Nitrite: NEGATIVE
Protein, ur: NEGATIVE mg/dL
Specific Gravity, Urine: <=1.005 (ref 1.005–1.030)
UROBILINOGEN UA: 0.2 mg/dL (ref 0.0–1.0)
pH: 6 (ref 5.0–8.0)

## 2014-02-20 NOTE — Patient Instructions (Signed)
Third Trimester of Pregnancy The third trimester is from week 29 through week 42, months 7 through 9. The third trimester is a time when the fetus is growing rapidly. At the end of the ninth month, the fetus is about 20 inches in length and weighs 6-10 pounds.  BODY CHANGES Your body goes through many changes during pregnancy. The changes vary from woman to woman.   Your weight will continue to increase. You can expect to gain 25-35 pounds (11-16 kg) by the end of the pregnancy.  You may begin to get stretch marks on your hips, abdomen, and breasts.  You may urinate more often because the fetus is moving lower into your pelvis and pressing on your bladder.  You may develop or continue to have heartburn as a result of your pregnancy.  You may develop constipation because certain hormones are causing the muscles that push waste through your intestines to slow down.  You may develop hemorrhoids or swollen, bulging veins (varicose veins).  You may have pelvic pain because of the weight gain and pregnancy hormones relaxing your joints between the bones in your pelvis. Backaches may result from overexertion of the muscles supporting your posture.  You may have changes in your hair. These can include thickening of your hair, rapid growth, and changes in texture. Some women also have hair loss during or after pregnancy, or hair that feels dry or thin. Your hair will most likely return to normal after your baby is born.  Your breasts will continue to grow and be tender. A yellow discharge may leak from your breasts called colostrum.  Your belly button may stick out.  You may feel short of breath because of your expanding uterus.  You may notice the fetus "dropping," or moving lower in your abdomen.  You may have a bloody mucus discharge. This usually occurs a few days to a week before labor begins.  Your cervix becomes thin and soft (effaced) near your due date. WHAT TO EXPECT AT YOUR PRENATAL  EXAMS  You will have prenatal exams every 2 weeks until week 36. Then, you will have weekly prenatal exams. During a routine prenatal visit:  You will be weighed to make sure you and the fetus are growing normally.  Your blood pressure is taken.  Your abdomen will be measured to track your baby's growth.  The fetal heartbeat will be listened to.  Any test results from the previous visit will be discussed.  You may have a cervical check near your due date to see if you have effaced. At around 36 weeks, your caregiver will check your cervix. At the same time, your caregiver will also perform a test on the secretions of the vaginal tissue. This test is to determine if a type of bacteria, Group B streptococcus, is present. Your caregiver will explain this further. Your caregiver may ask you:  What your birth plan is.  How you are feeling.  If you are feeling the baby move.  If you have had any abnormal symptoms, such as leaking fluid, bleeding, severe headaches, or abdominal cramping.  If you have any questions. Other tests or screenings that may be performed during your third trimester include:  Blood tests that check for low iron levels (anemia).  Fetal testing to check the health, activity level, and growth of the fetus. Testing is done if you have certain medical conditions or if there are problems during the pregnancy. FALSE LABOR You may feel small, irregular contractions that   eventually go away. These are called Braxton Hicks contractions, or false labor. Contractions may last for hours, days, or even weeks before true labor sets in. If contractions come at regular intervals, intensify, or become painful, it is best to be seen by your caregiver.  SIGNS OF LABOR   Menstrual-like cramps.  Contractions that are 5 minutes apart or less.  Contractions that start on the top of the uterus and spread down to the lower abdomen and back.  A sense of increased pelvic pressure or back  pain.  A watery or bloody mucus discharge that comes from the vagina. If you have any of these signs before the 37th week of pregnancy, call your caregiver right away. You need to go to the hospital to get checked immediately. HOME CARE INSTRUCTIONS   Avoid all smoking, herbs, alcohol, and unprescribed drugs. These chemicals affect the formation and growth of the baby.  Follow your caregiver's instructions regarding medicine use. There are medicines that are either safe or unsafe to take during pregnancy.  Exercise only as directed by your caregiver. Experiencing uterine cramps is a good sign to stop exercising.  Continue to eat regular, healthy meals.  Wear a good support bra for breast tenderness.  Do not use hot tubs, steam rooms, or saunas.  Wear your seat belt at all times when driving.  Avoid raw meat, uncooked cheese, cat litter boxes, and soil used by cats. These carry germs that can cause birth defects in the baby.  Take your prenatal vitamins.  Try taking a stool softener (if your caregiver approves) if you develop constipation. Eat more high-fiber foods, such as fresh vegetables or fruit and whole grains. Drink plenty of fluids to keep your urine clear or pale yellow.  Take warm sitz baths to soothe any pain or discomfort caused by hemorrhoids. Use hemorrhoid cream if your caregiver approves.  If you develop varicose veins, wear support hose. Elevate your feet for 15 minutes, 3-4 times a day. Limit salt in your diet.  Avoid heavy lifting, wear low heal shoes, and practice good posture.  Rest a lot with your legs elevated if you have leg cramps or low back pain.  Visit your dentist if you have not gone during your pregnancy. Use a soft toothbrush to brush your teeth and be gentle when you floss.  A sexual relationship may be continued unless your caregiver directs you otherwise.  Do not travel far distances unless it is absolutely necessary and only with the approval  of your caregiver.  Take prenatal classes to understand, practice, and ask questions about the labor and delivery.  Make a trial run to the hospital.  Pack your hospital bag.  Prepare the baby's nursery.  Continue to go to all your prenatal visits as directed by your caregiver. SEEK MEDICAL CARE IF:  You are unsure if you are in labor or if your water has broken.  You have dizziness.  You have mild pelvic cramps, pelvic pressure, or nagging pain in your abdominal area.  You have persistent nausea, vomiting, or diarrhea.  You have a bad smelling vaginal discharge.  You have pain with urination. SEEK IMMEDIATE MEDICAL CARE IF:   You have a fever.  You are leaking fluid from your vagina.  You have spotting or bleeding from your vagina.  You have severe abdominal cramping or pain.  You have rapid weight loss or gain.  You have shortness of breath with chest pain.  You notice sudden or extreme swelling   of your face, hands, ankles, feet, or legs.  You have not felt your baby move in over an hour.  You have severe headaches that do not go away with medicine.  You have vision changes. Document Released: 02/16/2001 Document Revised: 02/27/2013 Document Reviewed: 04/25/2012 [employer] Patient Information 2015 [employer], [employer]. This information is not intended to replace advice given to you by your health care provider. Make sure you discuss any questions you have with your health care provider.  

## 2014-02-20 NOTE — Progress Notes (Signed)
Pt states she feels contractions and feels that she has dropped

## 2014-02-20 NOTE — Progress Notes (Signed)
MFM report: US nl and recommend F/U growth 4 wks. D/W Diane: start 2x/wk fetal testing
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
## 2014-02-20 NOTE — Addendum Note (Signed)
Addended by: Jill SideAY, Mariaeduarda Defranco L on: 02/20/2014 12:02 PM   Modules accepted: Orders

## 2014-02-20 NOTE — Progress Notes (Signed)
Pt left clinic prior to receiving NST because she was not aware of plan of care.  Call placed to pt by Candace Haizlip, RN and message left for pt to call clinic. Pt needs to begin twice weekly fetal testing this week.

## 2014-02-20 NOTE — Addendum Note (Signed)
Addended by: Caren GriffinsPOE, DEIRDRE C on: 02/20/2014 10:07 AM   Modules accepted: Level of Service

## 2014-02-22 ENCOUNTER — Encounter: Payer: Self-pay | Admitting: Family Medicine

## 2014-02-22 ENCOUNTER — Other Ambulatory Visit: Payer: Medicaid Other

## 2014-02-22 ENCOUNTER — Encounter: Payer: Self-pay | Admitting: *Deleted

## 2014-03-06 ENCOUNTER — Encounter: Payer: Medicaid Other | Admitting: Advanced Practice Midwife

## 2014-03-06 ENCOUNTER — Encounter: Payer: Self-pay | Admitting: Advanced Practice Midwife

## 2014-03-08 NOTE — L&D Delivery Note (Signed)
Delivery Note At 5:35 AM a viable female was delivered via Vaginal, Spontaneous Delivery (Presentation: Left Occiput Anterior).  APGAR: 9, 9; weight  .   Placenta status: , Spontaneous.  Cord: 3 vessels with the following complications: None.  Cord pH: not obtained  Anesthesia: None  Episiotomy: None Lacerations: None Suture Repair: n/a Est. Blood Loss (mL): 300  Mom to postpartum.  Baby to Couplet care / Skin to Skin.  Elita BooneRoberts, Darene Nappi C 03/26/2014, 6:11 AM

## 2014-03-09 ENCOUNTER — Inpatient Hospital Stay (HOSPITAL_COMMUNITY)
Admission: AD | Admit: 2014-03-09 | Discharge: 2014-03-10 | Disposition: A | Discharge status: Home / Self Care | Payer: Medicaid Other | Source: Ambulatory Visit | Attending: Obstetrics and Gynecology | Admitting: Obstetrics and Gynecology

## 2014-03-09 DIAGNOSIS — O99613 Diseases of the digestive system complicating pregnancy, third trimester: Secondary | ICD-10-CM | POA: Diagnosis not present

## 2014-03-09 DIAGNOSIS — F329 Major depressive disorder, single episode, unspecified: Secondary | ICD-10-CM | POA: Diagnosis not present

## 2014-03-09 DIAGNOSIS — K219 Gastro-esophageal reflux disease without esophagitis: Secondary | ICD-10-CM | POA: Insufficient documentation

## 2014-03-09 DIAGNOSIS — Z3A35 35 weeks gestation of pregnancy: Secondary | ICD-10-CM | POA: Diagnosis not present

## 2014-03-09 DIAGNOSIS — A084 Viral intestinal infection, unspecified: Secondary | ICD-10-CM | POA: Diagnosis not present

## 2014-03-09 DIAGNOSIS — Z3A36 36 weeks gestation of pregnancy: Secondary | ICD-10-CM

## 2014-03-09 DIAGNOSIS — O358XX0 Maternal care for other (suspected) fetal abnormality and damage, not applicable or unspecified: Secondary | ICD-10-CM | POA: Diagnosis not present

## 2014-03-09 DIAGNOSIS — O10013 Pre-existing essential hypertension complicating pregnancy, third trimester: Secondary | ICD-10-CM | POA: Diagnosis not present

## 2014-03-09 DIAGNOSIS — O99343 Other mental disorders complicating pregnancy, third trimester: Secondary | ICD-10-CM | POA: Insufficient documentation

## 2014-03-09 DIAGNOSIS — O9989 Other specified diseases and conditions complicating pregnancy, childbirth and the puerperium: Secondary | ICD-10-CM | POA: Insufficient documentation

## 2014-03-09 MED ORDER — ONDANSETRON HCL 4 MG/2ML IJ SOLN
4.0000 mg | Freq: Once | INTRAMUSCULAR | Status: AC
  Administered 2014-03-09: 4 mg via INTRAVENOUS
  Filled 2014-03-09: qty 2

## 2014-03-09 MED ORDER — LACTATED RINGERS IV BOLUS (SEPSIS)
1000.0000 mL | Freq: Once | INTRAVENOUS | Status: AC
  Administered 2014-03-09: 1000 mL via INTRAVENOUS

## 2014-03-09 MED ORDER — FAMOTIDINE IN NACL 20-0.9 MG/50ML-% IV SOLN
20.0000 mg | Freq: Once | INTRAVENOUS | Status: AC
  Administered 2014-03-09: 20 mg via INTRAVENOUS
  Filled 2014-03-09: qty 50

## 2014-03-09 MED ORDER — PROMETHAZINE HCL 25 MG/ML IJ SOLN
12.5000 mg | Freq: Once | INTRAMUSCULAR | Status: AC
  Administered 2014-03-09: 12.5 mg via INTRAVENOUS
  Filled 2014-03-09: qty 1

## 2014-03-09 MED ORDER — PROMETHAZINE HCL 25 MG/ML IJ SOLN
12.5000 mg | Freq: Once | INTRAMUSCULAR | Status: AC | PRN
  Administered 2014-03-09: 12.5 mg via INTRAVENOUS
  Filled 2014-03-09: qty 1

## 2014-03-09 NOTE — MAU Note (Signed)
Pt arrived EMS with c/o ctx. Denies SROM or bleeding at this time

## 2014-03-09 NOTE — MAU Provider Note (Signed)
History  Chief Complaint:  Labor Eval  [name] is a 34 y.o. [phone] female at [redacted]w[redacted]d presenting via ems w/ report of abd pain/uc's, n/v/d that began today.  Has not had any known sick contacts.   Reports active fetal movement, contractions: regular, vaginal bleeding: none, membranes: intact. Denies uti s/s, abnormal/malodorous vag d/c or vulvovaginal itching/irritation.   Prenatal care at [facility].  Next visit doesn't have one scheduled, has missed last 2 appts d/t transportation issues, was supposed to be coming for 2x/wk antenatal testing. Pregnancy complicated by [facility]- no meds, fetal VSD, resolved partial previa, prev ptb @ 36wks, h/o pre-e, substance use, abnormal pap, gerd, depression on zoloft  Obstetrical History: OB History    Gravida Para Term Preterm AB TAB SAB Ectopic Multiple Living   0 Past Medical History: Past Medical History  Diagnosis Date  . GERD (gastroesophageal reflux disease)   . Acid reflux   . Hypertension   . Anemia   . Depression   . Vaginal Pap smear, abnormal     Past Surgical History: Past Surgical History  Procedure Laterality Date  . Leep      Social History: History   Social History  . Marital Status: Single    Spouse Name: N/A    Number of Children: N/A  . Years of Education: N/A   Social History Main Topics  . Smoking status: Never Smoker   . Smokeless tobacco: Never Used  . Alcohol Use: No  . Drug Use: No     Comment: occasionally  . Sexual Activity: Yes   Other Topics Concern  . Not on file   Social History Narrative    Allergies: No Known Allergies  Prescriptions prior to admission  Medication Sig Dispense Refill Last Dose  . aspirin 81 MG chewable tablet Chew 81 mg by mouth daily.   03/09/2014 at Unknown time  . flintstones complete (FLINTSTONES) 60 MG chewable tablet Chew 2 tablets by mouth daily.   03/09/2014 at Unknown time  . omeprazole (PRILOSEC) 40 MG capsule Take 1 capsule (40 mg total) by  mouth daily. 30 capsule 5 Past Week at Unknown time  . ondansetron (ZOFRAN) 4 MG tablet Take 1 tablet (4 mg total) by mouth every 6 (six) hours. (Patient taking differently: Take 4 mg by mouth every 6 (six) hours as needed for nausea or vomiting. ) 30 tablet 5 03/09/2014 at Unknown time  . potassium chloride SA (K-DUR,KLOR-CON) 20 MEQ tablet Take 2 tablets (40 mEq total) by mouth daily. 30 tablet 2 03/09/2014 at Unknown time  . promethazine (PHENERGAN) 25 MG tablet Take 1 tablet (25 mg total) by mouth every 6 (six) hours as needed for nausea or vomiting. 30 tablet 5 03/09/2014 at Unknown time  . ranitidine (ZANTAC) 150 MG tablet Take 150 mg by mouth 2 (two) times daily.    03/09/2014 at Unknown time  . sertraline (ZOLOFT) 100 MG tablet Take 1 tablet (100 mg total) by mouth daily. 30 tablet 3 03/09/2014 at Unknown time  . methylPREDNISolone (MEDROL) 4 MG tablet Take as instructed on accompanying taper sheet (Patient not taking: Reported on 02/20/2014) 70 tablet 1 Not Taking  . Prenatal Vit-Fe Fumarate-FA (PRENATAL MULTIVITAMIN) TABS tablet Take 1 tablet by mouth daily at 12 noon. (Patient not taking: Reported on 03/09/2014) 30 tablet 5 Taking    Review of Systems  Pertinent pos/neg as indicated in HPI  Physical Exam  Blood pressure 132/68, pulse 104, temperature 97.5 F (36.4 C), resp. rate 18, last menstrual period 07/01/2013. General appearance: alert, cooperative and mild distress Lungs: clear to auscultation bilaterally, normal effort Heart: regular rate and rhythm Abdomen: gravid, soft, non-tender Extremities: no edema DTR's 2+  Spec exam: n/a Cultures/Specimens: n/a  Dilation: Fingertip Effacement (%): 30 Cervical Position: Posterior Station: Ballotable Presentation: Vertex Exam by:: K.Willson,RN Presentation: cephalic  Fetal monitoring: FHR: 125 bpm, variability: moderate,  Accelerations: Present,  decelerations:  Absent Uterine activity: none  MAU Course  2L LR bolus Phenergan   IV in 2 doses Pepcid  IV Zofran  IV CBC, CMET- pt refused until vomiting improved  0145: no vomiting x >1hr, feeling some better. Does not want to try to drink any water/gingerale or try crackers at this time. Normal labs, K+ stable. No diarrhea since here.   Labs:  Results for orders placed or performed during the hospital encounter of 03/09/14 (from the past 24 hour(s))  CBC     Status: Abnormal   Collection Time: 03/10/14 12:35 AM  Result Value Ref Range   WBC 10.3 4.0 - 10.5 K/uL   RBC 3.07 (L) 3.87 - 5.11 MIL/uL   Hemoglobin 9.3 (L) 12.0 - 15.0 g/dL   HCT 42.5 (L) 95.6 - 38.7 %   MCV 88.9 78.0 - 100.0 fL   MCH 30.3 26.0 - 34.0 pg   MCHC 34.1 30.0 - 36.0 g/dL   RDW 56.4 33.2 - 95.1 %   Platelets 214 150 - 400 K/uL  Comprehensive metabolic panel     Status: Abnormal   Collection Time: 03/10/14 12:35 AM  Result Value Ref Range   Sodium 138 135 - 145 mmol/L   Potassium 3.6 3.5 - 5.1 mmol/L   Chloride 107 96 - 112 mEq/L   CO2 22 19 - 32 mmol/L   Glucose, Bld 115 (H) 70 - 99 mg/dL   BUN 9 6 - 23 mg/dL   Creatinine, Ser 8.84 0.50 - 1.10 mg/dL   Calcium 9.1 8.4 - 16.6 mg/dL   Total Protein 6.4 6.0 - 8.3 g/dL   Albumin 2.7 (L) 3.5 - 5.2 g/dL   AST 18 0 - 37 U/L   ALT 10 0 - 35 U/L   Alkaline Phosphatase 133 (H) 39 - 117 U/L   Total Bilirubin 0.4 0.3 - 1.2 mg/dL   GFR calc non Af Amer >90 >90 mL/min   GFR calc Af Amer >90 >90 mL/min   Anion gap 9 5 - 15    Imaging:  n/a  Assessment and Plan  A:  [redacted]w[redacted]d [facility]  [id number]  Viral gastroenteritis  Reactive NST/Cat I  CHTN  Fetal VSD P:  D/C home  Rx zofran odt q 8hr prn, can also use phenergan she has at home if needed, immodium if needed if diarrhea comes back  Reviewed ptl s/s, fkc, reasons to return including if she is unable to keep food/fluids down/not making spit/not peeing  Call Monday to schedule next appt at [facility] (has missed last few and hasn't started antenatal testing)- note also sent to clinicial admin  pool to notify them  SW consult placed to see if they can help w/ transportation issues   Marge Duncans CNM,WHNP-BC 1/3/20161:50 AM

## 2014-03-10 ENCOUNTER — Inpatient Hospital Stay (HOSPITAL_COMMUNITY)
Admission: AD | Admit: 2014-03-10 | Discharge: 2014-03-10 | Disposition: A | Discharge status: Home / Self Care | Payer: Medicaid Other | Source: Ambulatory Visit | Attending: Obstetrics and Gynecology | Admitting: Obstetrics and Gynecology

## 2014-03-10 ENCOUNTER — Observation Stay (HOSPITAL_COMMUNITY)
Admission: AD | Admit: 2014-03-10 | Discharge: 2014-03-11 | Disposition: A | Discharge status: Home / Self Care | Payer: Medicaid Other | Source: Ambulatory Visit | Attending: Obstetrics & Gynecology | Admitting: Obstetrics & Gynecology

## 2014-03-10 ENCOUNTER — Encounter (HOSPITAL_COMMUNITY): Payer: Self-pay | Admitting: *Deleted

## 2014-03-10 DIAGNOSIS — O99613 Diseases of the digestive system complicating pregnancy, third trimester: Secondary | ICD-10-CM

## 2014-03-10 DIAGNOSIS — O358XX1 Maternal care for other (suspected) fetal abnormality and damage, fetus 1: Secondary | ICD-10-CM

## 2014-03-10 DIAGNOSIS — O4420 Partial placenta previa NOS or without hemorrhage, unspecified trimester: Secondary | ICD-10-CM

## 2014-03-10 DIAGNOSIS — K219 Gastro-esophageal reflux disease without esophagitis: Secondary | ICD-10-CM | POA: Insufficient documentation

## 2014-03-10 DIAGNOSIS — O10013 Pre-existing essential hypertension complicating pregnancy, third trimester: Secondary | ICD-10-CM

## 2014-03-10 DIAGNOSIS — F329 Major depressive disorder, single episode, unspecified: Secondary | ICD-10-CM | POA: Insufficient documentation

## 2014-03-10 DIAGNOSIS — Z3A36 36 weeks gestation of pregnancy: Secondary | ICD-10-CM

## 2014-03-10 DIAGNOSIS — O99343 Other mental disorders complicating pregnancy, third trimester: Secondary | ICD-10-CM | POA: Insufficient documentation

## 2014-03-10 DIAGNOSIS — O9989 Other specified diseases and conditions complicating pregnancy, childbirth and the puerperium: Secondary | ICD-10-CM | POA: Insufficient documentation

## 2014-03-10 DIAGNOSIS — O219 Vomiting of pregnancy, unspecified: Secondary | ICD-10-CM

## 2014-03-10 DIAGNOSIS — A084 Viral intestinal infection, unspecified: Secondary | ICD-10-CM

## 2014-03-10 DIAGNOSIS — O210 Mild hyperemesis gravidarum: Secondary | ICD-10-CM

## 2014-03-10 DIAGNOSIS — O3443 Maternal care for other abnormalities of cervix, third trimester: Secondary | ICD-10-CM

## 2014-03-10 DIAGNOSIS — E860 Dehydration: Secondary | ICD-10-CM | POA: Insufficient documentation

## 2014-03-10 DIAGNOSIS — O35BXX1 Maternal care for other (suspected) fetal abnormality and damage, fetal cardiac anomalies, fetus 1: Secondary | ICD-10-CM

## 2014-03-10 DIAGNOSIS — O10913 Unspecified pre-existing hypertension complicating pregnancy, third trimester: Secondary | ICD-10-CM

## 2014-03-10 DIAGNOSIS — K529 Noninfective gastroenteritis and colitis, unspecified: Secondary | ICD-10-CM

## 2014-03-10 DIAGNOSIS — Z9889 Other specified postprocedural states: Secondary | ICD-10-CM

## 2014-03-10 DIAGNOSIS — O09293 Supervision of pregnancy with other poor reproductive or obstetric history, third trimester: Secondary | ICD-10-CM

## 2014-03-10 LAB — COMPREHENSIVE METABOLIC PANEL
ALK PHOS: 133 U/L — AB (ref 39–117)
ALT: 10 U/L (ref 0–35)
ANION GAP: 9 (ref 5–15)
AST: 18 U/L (ref 0–37)
Albumin: 2.7 g/dL — ABNORMAL LOW (ref 3.5–5.2)
BUN: 9 mg/dL (ref 6–23)
CHLORIDE: 107 meq/L (ref 96–112)
CO2: 22 mmol/L (ref 19–32)
Calcium: 9.1 mg/dL (ref 8.4–10.5)
Creatinine, Ser: 0.54 mg/dL (ref 0.50–1.10)
GFR calc Af Amer: >90 mL/min (ref >90)
Glucose, Bld: 115 mg/dL — ABNORMAL HIGH (ref 70–99)
POTASSIUM: 3.6 mmol/L (ref 3.5–5.1)
Sodium: 138 mmol/L (ref 135–145)
TOTAL PROTEIN: 6.4 g/dL (ref 6.0–8.3)
Total Bilirubin: 0.4 mg/dL (ref 0.3–1.2)

## 2014-03-10 LAB — URINE MICROSCOPIC-ADD ON

## 2014-03-10 LAB — CBC
HCT: 27.3 % — ABNORMAL LOW (ref 36.0–46.0)
HCT: 29.1 % — ABNORMAL LOW (ref 36.0–46.0)
HEMOGLOBIN: 9.7 g/dL — AB (ref 12.0–15.0)
Hemoglobin: 9.3 g/dL — ABNORMAL LOW (ref 12.0–15.0)
MCH: 29.8 pg (ref 26.0–34.0)
MCH: 30.3 pg (ref 26.0–34.0)
MCHC: 33.3 g/dL (ref 30.0–36.0)
MCHC: 34.1 g/dL (ref 30.0–36.0)
MCV: 88.9 fL (ref 78.0–100.0)
MCV: 89.5 fL (ref 78.0–100.0)
PLATELETS: 214 10*3/uL (ref 150–400)
Platelets: 253 10*3/uL (ref 150–400)
RBC: 3.07 MIL/uL — ABNORMAL LOW (ref 3.87–5.11)
RBC: 3.25 MIL/uL — ABNORMAL LOW (ref 3.87–5.11)
RDW: 14 % (ref 11.5–15.5)
RDW: 14 % (ref 11.5–15.5)
WBC: 10.3 10*3/uL (ref 4.0–10.5)
WBC: 12.2 10*3/uL — ABNORMAL HIGH (ref 4.0–10.5)

## 2014-03-10 LAB — URINALYSIS, ROUTINE W REFLEX MICROSCOPIC
BILIRUBIN URINE: NEGATIVE
Glucose, UA: NEGATIVE mg/dL
Hgb urine dipstick: NEGATIVE
Ketones, ur: >80 mg/dL — AB
Leukocytes, UA: NEGATIVE
Nitrite: NEGATIVE
PROTEIN: 30 mg/dL — AB
UROBILINOGEN UA: 0.2 mg/dL (ref 0.0–1.0)
pH: 6.5 (ref 5.0–8.0)

## 2014-03-10 MED ORDER — SCOPOLAMINE 1 MG/3DAYS TD PT72
1.0000 | MEDICATED_PATCH | TRANSDERMAL | Status: DC
  Administered 2014-03-10: 1.5 mg via TRANSDERMAL
  Filled 2014-03-10: qty 1

## 2014-03-10 MED ORDER — PROMETHAZINE HCL 25 MG/ML IJ SOLN
25.0000 mg | Freq: Once | INTRAMUSCULAR | Status: AC
  Administered 2014-03-10: 25 mg via INTRAMUSCULAR
  Filled 2014-03-10: qty 1

## 2014-03-10 MED ORDER — ONDANSETRON HCL 4 MG/2ML IJ SOLN
4.0000 mg | Freq: Once | INTRAMUSCULAR | Status: AC
  Administered 2014-03-10: 4 mg via INTRAMUSCULAR
  Filled 2014-03-10: qty 2

## 2014-03-10 MED ORDER — METOCLOPRAMIDE HCL 5 MG/ML IJ SOLN
10.0000 mg | Freq: Once | INTRAMUSCULAR | Status: AC
  Administered 2014-03-10: 10 mg via INTRAMUSCULAR
  Filled 2014-03-10: qty 2

## 2014-03-10 MED ORDER — ONDANSETRON HCL 4 MG/2ML IJ SOLN
4.0000 mg | Freq: Three times a day (TID) | INTRAMUSCULAR | Status: DC | PRN
  Administered 2014-03-11: 4 mg via INTRAVENOUS
  Filled 2014-03-10: qty 2

## 2014-03-10 MED ORDER — PROMETHAZINE HCL 25 MG/ML IJ SOLN
25.0000 mg | Freq: Four times a day (QID) | INTRAMUSCULAR | Status: DC | PRN
  Administered 2014-03-11: 25 mg via INTRAVENOUS
  Filled 2014-03-10: qty 1

## 2014-03-10 MED ORDER — ONDANSETRON 4 MG PO TBDP: 4.0000 mg | ORAL_TABLET | Freq: Three times a day (TID) | ORAL | Status: DC | PRN

## 2014-03-10 MED ORDER — LACTATED RINGERS IV SOLN
INTRAVENOUS | Status: DC
Start: 2014-03-10 — End: 2014-03-11
  Administered 2014-03-11: 03:00:00 via INTRAVENOUS

## 2014-03-10 MED ORDER — LACTATED RINGERS IV BOLUS (SEPSIS)
1000.0000 mL | Freq: Once | INTRAVENOUS | Status: AC
  Administered 2014-03-09: 1000 mL via INTRAVENOUS

## 2014-03-10 MED ORDER — ONDANSETRON 4 MG PO TBDP
4.0000 mg | ORAL_TABLET | Freq: Once | ORAL | Status: AC
  Administered 2014-03-10: 4 mg via ORAL
  Filled 2014-03-10: qty 1

## 2014-03-10 MED ORDER — PROMETHAZINE HCL 25 MG/ML IJ SOLN
25.0000 mg | INTRAMUSCULAR | Status: DC | PRN
  Administered 2014-03-10: 25 mg via INTRAMUSCULAR
  Filled 2014-03-10: qty 1

## 2014-03-10 MED ORDER — ONDANSETRON HCL 4 MG/2ML IJ SOLN: 8.0000 mg | Freq: Once | INTRAMUSCULAR | Status: DC

## 2014-03-10 MED ORDER — PROMETHAZINE HCL 25 MG RE SUPP
25.0000 mg | Freq: Once | RECTAL | Status: AC
  Filled 2014-03-10: qty 1

## 2014-03-10 MED ORDER — LACTATED RINGERS IV BOLUS (SEPSIS)
1000.0000 mL | Freq: Once | INTRAVENOUS | Status: AC
  Administered 2014-03-11: 1000 mL via INTRAVENOUS

## 2014-03-10 NOTE — MAU Note (Signed)
Pt states that her n/v and pain have only became worse since her last discharge from hospital.

## 2014-03-10 NOTE — MAU Note (Signed)
Pt refused Phenergan suppository. Grace Bushy, CNM made aware.

## 2014-03-10 NOTE — MAU Provider Note (Signed)
S: continues to have protracted vomiting O: contiunes vomitng, VSS, fetus stable A: gastroenteritis , pregnancy P phenergan IM, and transderm scop patch. Introduce liquids.

## 2014-03-10 NOTE — Discharge Instructions (Signed)
Call the clinic [phone]) 1st thing Monday morning to schedule your next appointment, you should be going 2 times a week now.   You seem to have a GI bug/virus, it typically lasts 24-48 hours Take the new zofran dissolving tablet every 8 hours as needed for nausea/vomiting You can also take the phenergan you have at home as needed if the zofran isn't helping If you are unable to keep any food/fluid down, not able to make spit, not peeing you should come back in to be evaluated again If the diarrhea worsens you can take over the counter Immodium  Call the clinic [phone]) or go to [facility] if:  You begin to have strong, frequent contractions  Your water breaks.  Sometimes it is a big gush of fluid, sometimes it is just a trickle that keeps getting your panties wet or running down your legs  You have vaginal bleeding.  It is normal to have a small amount of spotting if your cervix was checked.   You don't feel your baby moving like normal.  If you don't, get you something to eat and drink and lay down and focus on feeling your baby move.  You should feel at least 10 movements in 2 hours.  If you don't, you should call the clinic or go to [facility].     Viral Gastroenteritis Viral gastroenteritis is also known as stomach flu. This condition affects the stomach and intestinal tract. It can cause sudden diarrhea and vomiting. The illness typically lasts 3 to 8 days. Most people develop an immune response that eventually gets rid of the virus. While this natural response develops, the virus can make you quite ill. CAUSES  Many different viruses can cause gastroenteritis, such as rotavirus or noroviruses. You can catch one of these viruses by consuming contaminated food or water. You may also catch a virus by sharing utensils or other personal items with an infected person or by touching a contaminated surface. SYMPTOMS  The most common symptoms are diarrhea and vomiting. These  problems can cause a severe loss of body fluids (dehydration) and a body salt (electrolyte) imbalance. Other symptoms may include:  Fever.  Headache.  Fatigue.  Abdominal pain. DIAGNOSIS  Your caregiver can usually diagnose viral gastroenteritis based on your symptoms and a physical exam. A stool sample may also be taken to test for the presence of viruses or other infections. TREATMENT  This illness typically goes away on its own. Treatments are aimed at rehydration. The most serious cases of viral gastroenteritis involve vomiting so severely that you are not able to keep fluids down. In these cases, fluids must be given through an intravenous line (IV). HOME CARE INSTRUCTIONS   Drink enough fluids to keep your urine clear or pale yellow. Drink small amounts of fluids frequently and increase the amounts as tolerated.  Ask your caregiver for specific rehydration instructions.  Avoid:  Foods high in sugar.  Alcohol.  Carbonated drinks.  Tobacco.  Juice.  Caffeine drinks.  Extremely hot or cold fluids.  Fatty, greasy foods.  Too much intake of anything at one time.  Dairy products until 24 to 48 hours after diarrhea stops.  You may consume probiotics. Probiotics are active cultures of beneficial bacteria. They may lessen the amount and number of diarrheal stools in adults. Probiotics can be found in yogurt with active cultures and in supplements.  Wash your hands well to avoid spreading the virus.  Only take over-the-counter or prescription medicines for  pain, discomfort, or fever as directed by your caregiver. Do not give aspirin to children. Antidiarrheal medicines are not recommended.  Ask your caregiver if you should continue to take your regular prescribed and over-the-counter medicines.  Keep all follow-up appointments as directed by your caregiver. SEEK IMMEDIATE MEDICAL CARE IF:   You are unable to keep fluids down.  You do not urinate at least once every 6  to 8 hours.  You develop shortness of breath.  You notice blood in your stool or vomit. This may look like coffee grounds.  You have abdominal pain that increases or is concentrated in one small area (localized).  You have persistent vomiting or diarrhea.  You have a fever.  The patient is a child younger than 3 months, and he or she has a fever.  The patient is a child older than 3 months, and he or she has a fever and persistent symptoms.  The patient is a child older than 3 months, and he or she has a fever and symptoms suddenly get worse.  The patient is a baby, and he or she has no tears when crying. MAKE SURE YOU:   Understand these instructions.  Will watch your condition.  Will get help right away if you are not doing well or get worse. Document Released: 02/22/2005 Document Revised: 05/17/2011 Document Reviewed: 12/09/2010 [facility] Patient Information 2015 [place], [place]. This information is not intended to replace advice given to you by your health care provider. Make sure you discuss any questions you have with your health care provider.

## 2014-03-10 NOTE — H&P (Signed)
[name] is a 34 y.o. [phone] female at [redacted]w[redacted]d with recurrent refractory nausea and vomiting. This is her third presentation for the same. After being discharged this morning she reports intractable nausea, vomiting, and abdominal pain.   Abdominal pain is low and intermittent, unchanged from the past 3 days. She is feeling regular contractions. Cervical check showed no change this morning. Denies HA, visual changes, VB, loss of fluid. +GFM. No dysuria  Prenatal care at [facility], missed a few appointments due to transportation difficulties. She has been missing biweekly antenatal testing. Pregnancy complicated by [facility] without medications, chronic HTN, substance abuse (+THC on UDS Dec 2 used for nausea), history of pre-eclampsia and PTL at 36wks, GERD, and depression on zoloft. She had LSIL on pap smear 10/08/2013.  History OB History    Gravida Para Term Preterm AB TAB SAB Ectopic Multiple Living   0 Past Medical History  Diagnosis Date  . GERD (gastroesophageal reflux disease)   . Acid reflux   . Hypertension   . Anemia   . Depression   . Vaginal Pap smear, abnormal    Past Surgical History  Procedure Laterality Date  . Leep     Family History: family history includes Asthma in her sister and son; Heart disease in her mother; Hyperlipidemia in her mother; Hypertension in her mother; Stroke in her mother. Social History:  reports that she has never smoked. She has never used smokeless tobacco. She reports that she does not drink alcohol or use illicit drugs.   Prenatal Transfer Tool  Maternal Diabetes: No Genetic Screening: Normal Maternal Ultrasounds/Referrals: Abnormal:  Findings:   Fetal Heart Anomalies Fetal Ultrasounds or other Referrals:  Referred to Materal Fetal Medicine  Maternal Substance Abuse:  Yes:  Type: Marijuana Significant Maternal Medications:  Meds include: Zoloft Significant Maternal Lab Results:  None Other Comments:  None  ROS per  HPI  Dilation: 1 Exam by:: Ariana Emmer RN  Blood pressure 147/80, pulse 88, temperature 98.5 F (36.9 C), temperature source Oral, resp. rate 18, last menstrual period 07/01/2013. Exam Physical Exam  Gen: Ill-appearing 34 y.o.female actively retching. HEENT: Dry mucous membranes Pulm: Non-labored, clear breath sounds CV: Regular rate, cap refill ~2 sec Abd: Soft, appropriately gravid, diffusely tender without rebound or guarding FHT:  Baseline 130, moderate variability, 15 x 15 accelerations present, no decelerations Toco: Uterine irritability  Prenatal labs: ABO, Rh: A/Positive/-- (08/03 0000) Antibody: Negative (08/03 0000) Rubella: Immune (08/03 0000) RPR: NON REAC (12/02 0933)  HBsAg: Negative (08/03 0000)  HIV: NONREACTIVE (12/02 0933)  GBS:   Neg in 1st trimester  Assessment/Plan: [name] is a 34 y.o. [id number] at [redacted]w[redacted]d being admitted for observation for hyperemesis and dehydration.  - LR 1L bolus -> 150cc/hr - Zofran, phenergan, pepcid - Repeat CBC, CMP - Manage expectantly  [name] 03/11/2014, 12:09 AM

## 2014-03-10 NOTE — MAU Provider Note (Signed)
History  Chief Complaint:  Emesis During Pregnancy  [name] is a 34 y.o. [phone] female at [redacted]w[redacted]d who was seen earlier for n/v/d, given 2 L LR, phenergan, zofran, and pepcid, cbc and cmet normal, vomiting stopped so she was d/c'd, she did not have transportation so nurses let her stay in room until able to catch bus in am.  She woke up in middle of night and began vomiting again and did not feel like she can go home like this.    Reports active fetal movement, contractions: none, vaginal bleeding: none, membranes: intact. Denies uti s/s, abnormal/malodorous vag d/c or vulvovaginal itching/irritation.   Prenatal care at [facility], has missed last few visits, needs to be in 2x/wk antenatal testing.  Pregnancy complicated by [facility] no meds, hyperemesis- last admit in Oct, substance abuse, gerd, h/o pre-e, hypokalemia on kdur.   Obstetrical History: OB History    Gravida Para Term Preterm AB TAB SAB Ectopic Multiple Living   0 Past Medical History: Past Medical History  Diagnosis Date  . GERD (gastroesophageal reflux disease)   . Acid reflux   . Hypertension   . Anemia   . Depression   . Vaginal Pap smear, abnormal     Past Surgical History: Past Surgical History  Procedure Laterality Date  . Leep      Social History: History   Social History  . Marital Status: Single    Spouse Name: N/A    Number of Children: N/A  . Years of Education: N/A   Social History Main Topics  . Smoking status: Never Smoker   . Smokeless tobacco: Never Used  . Alcohol Use: No  . Drug Use: No     Comment: occasionally  . Sexual Activity: Yes   Other Topics Concern  . Not on file   Social History Narrative    Allergies: No Known Allergies  Prescriptions prior to admission  Medication Sig Dispense Refill Last Dose  . aspirin 81 MG chewable tablet Chew 81 mg by mouth daily.   Past Week at Unknown time  . flintstones complete (FLINTSTONES) 60 MG chewable tablet Chew  2 tablets by mouth daily.   Past Week at Unknown time  . omeprazole (PRILOSEC) 40 MG capsule Take 1 capsule (40 mg total) by mouth daily. 30 capsule 5 Past Week at Unknown time  . ondansetron (ZOFRAN ODT) 4 MG disintegrating tablet Take 1-2 tablets (4-8 mg total) by mouth every 8 (eight) hours as needed for nausea or vomiting. 20 tablet 0 Past Week at Unknown time  . potassium chloride SA (K-DUR,KLOR-CON) 20 MEQ tablet Take 2 tablets (40 mEq total) by mouth daily. 30 tablet 2 Past Week at Unknown time  . promethazine (PHENERGAN) 25 MG tablet Take 1 tablet (25 mg total) by mouth every 6 (six) hours as needed for nausea or vomiting. 30 tablet 5 Past Week at Unknown time  . ranitidine (ZANTAC) 150 MG tablet Take 150 mg by mouth 2 (two) times daily.    Past Week at Unknown time  . sertraline (ZOLOFT) 100 MG tablet Take 1 tablet (100 mg total) by mouth daily. 30 tablet 3 Past Week at Unknown time    Review of Systems  Pertinent pos/neg as indicated in HPI  Physical Exam  Blood pressure 132/74, pulse 95, temperature 98 F (36.7 C), resp. rate 18, last menstrual period 07/01/2013, SpO2 88 %. General appearance: alert, cooperative and  no distress Lungs: clear to auscultation bilaterally, normal effort Heart: regular rate and rhythm Abdomen: gravid, soft, non-tender Extremities: No edema DTR's 2+  Spec exam: n/a Cultures/Specimens: n/a    Fetal monitoring: FHR: 125 bpm, variability: moderate,  Accelerations: Present,  decelerations:  Absent Uterine activity: none  MAU Course  UA Refused phenergan suppository Gave zofran  & phenergan  IM Reglan  IM  Labs:  Results for orders placed or performed during the hospital encounter of 03/09/14 (from the past 24 hour(s))  CBC     Status: Abnormal   Collection Time: 03/10/14 12:35 AM  Result Value Ref Range   WBC 10.3 4.0 - 10.5 K/uL   RBC 3.07 (L) 3.87 - 5.11 MIL/uL   Hemoglobin 9.3 (L) 12.0 - 15.0 g/dL   HCT 96.0 (L) 45.4 - 09.8 %    MCV 88.9 78.0 - 100.0 fL   MCH 30.3 26.0 - 34.0 pg   MCHC 34.1 30.0 - 36.0 g/dL   RDW 11.9 14.7 - 82.9 %   Platelets 214 150 - 400 K/uL  Comprehensive metabolic panel     Status: Abnormal   Collection Time: 03/10/14 12:35 AM  Result Value Ref Range   Sodium 138 135 - 145 mmol/L   Potassium 3.6 3.5 - 5.1 mmol/L   Chloride 107 96 - 112 mEq/L   CO2 22 19 - 32 mmol/L   Glucose, Bld 115 (H) 70 - 99 mg/dL   BUN 9 6 - 23 mg/dL   Creatinine, Ser 5.62 0.50 - 1.10 mg/dL   Calcium 9.1 8.4 - 13.0 mg/dL   Total Protein 6.4 6.0 - 8.3 g/dL   Albumin 2.7 (L) 3.5 - 5.2 g/dL   AST 18 0 - 37 U/L   ALT 10 0 - 35 U/L   Alkaline Phosphatase 133 (H) 39 - 117 U/L   Total Bilirubin 0.4 0.3 - 1.2 mg/dL   GFR calc non Af Amer >90 >90 mL/min   GFR calc Af Amer >90 >90 mL/min   Anion gap 9 5 - 15    Imaging:  n/a  Assessment and Plan  A:  [redacted]w[redacted]d SIUP  [id number]  Reactive NST/Cat 1 FHR  Marge Duncans CNM,WHNP-BC 1/3/20166:38 AM  0830: reported to darlene lawson, cnm who assumed pt care

## 2014-03-10 NOTE — Discharge Instructions (Signed)
Nausea and Vomiting  Nausea is a sick feeling that often comes before throwing up (vomiting). Vomiting is a reflex where stomach contents come out of your mouth. Vomiting can cause severe loss of body fluids (dehydration). Children and elderly adults can become dehydrated quickly, especially if they also have diarrhea. Nausea and vomiting are symptoms of a condition or disease. It is important to find the cause of your symptoms.  CAUSES   · Direct irritation of the stomach lining. This irritation can result from increased acid production (gastroesophageal reflux disease), infection, food poisoning, taking certain medicines (such as nonsteroidal anti-inflammatory drugs), alcohol use, or tobacco use.  · Signals from the brain. These signals could be caused by a headache, heat exposure, an inner ear disturbance, increased pressure in the brain from injury, infection, a tumor, or a concussion, pain, emotional stimulus, or metabolic problems.  · An obstruction in the gastrointestinal tract (bowel obstruction).  · Illnesses such as diabetes, hepatitis, gallbladder problems, appendicitis, kidney problems, cancer, sepsis, atypical symptoms of a heart attack, or eating disorders.  · Medical treatments such as chemotherapy and radiation.  · Receiving medicine that makes you sleep (general anesthetic) during surgery.  DIAGNOSIS  Your caregiver may ask for tests to be done if the problems do not improve after a few days. Tests may also be done if symptoms are severe or if the reason for the nausea and vomiting is not clear. Tests may include:  · Urine tests.  · Blood tests.  · Stool tests.  · Cultures (to look for evidence of infection).  · X-rays or other imaging studies.  Test results can help your caregiver make decisions about treatment or the need for additional tests.  TREATMENT  You need to stay well hydrated. Drink frequently but in small amounts. You may wish to drink water, sports drinks, clear broth, or eat frozen  ice pops or gelatin dessert to help stay hydrated. When you eat, eating slowly may help prevent nausea. There are also some antinausea medicines that may help prevent nausea.  HOME CARE INSTRUCTIONS   · Take all medicine as directed by your caregiver.  · If you do not have an appetite, do not force yourself to eat. However, you must continue to drink fluids.  · If you have an appetite, eat a normal diet unless your caregiver tells you differently.  ¨ Eat a variety of complex carbohydrates (rice, wheat, potatoes, bread), lean meats, yogurt, fruits, and vegetables.  ¨ Avoid high-fat foods because they are more difficult to digest.  · Drink enough water and fluids to keep your urine clear or pale yellow.  · If you are dehydrated, ask your caregiver for specific rehydration instructions. Signs of dehydration may include:  ¨ Severe thirst.  ¨ Dry lips and mouth.  ¨ Dizziness.  ¨ Dark urine.  ¨ Decreasing urine frequency and amount.  ¨ Confusion.  ¨ Rapid breathing or pulse.  SEEK IMMEDIATE MEDICAL CARE IF:   · You have blood or brown flecks (like coffee grounds) in your vomit.  · You have black or bloody stools.  · You have a severe headache or stiff neck.  · You are confused.  · You have severe abdominal pain.  · You have chest pain or trouble breathing.  · You do not urinate at least once every 8 hours.  · You develop cold or clammy skin.  · You continue to vomit for longer than 24 to 48 hours.  · You have a fever.  MAKE SURE YOU:   ·   Understand these instructions.  · Will watch your condition.  · Will get help right away if you are not doing well or get worse.  Document Released: 02/22/2005 Document Revised: 05/17/2011 Document Reviewed: 07/22/2010  ExitCare® Patient Information ©2015 [employer]. This information is not intended to replace advice given to you by your health care provider. Make sure you discuss any questions you have with your health care provider.  Food Choices to Help Relieve Diarrhea  When you  have diarrhea, the foods you eat and your eating habits are very important. Choosing the right foods and drinks can help relieve diarrhea. Also, because diarrhea can last up to 7 days, you need to replace lost fluids and electrolytes (such as sodium, potassium, and chloride) in order to help prevent dehydration.   WHAT GENERAL GUIDELINES DO I NEED TO FOLLOW?  · Slowly drink 1 cup (8 oz) of fluid for each episode of diarrhea. If you are getting enough fluid, your urine will be clear or pale yellow.  · Eat starchy foods. Some good choices include white rice, white toast, pasta, low-fiber cereal, baked potatoes (without the skin), saltine crackers, and bagels.  · Avoid large servings of any cooked vegetables.  · Limit fruit to two servings per day. A serving is ½ cup or 1 small piece.  · Choose foods with less than 2 g of fiber per serving.  · Limit fats to less than 8 tsp (38 g) per day.  · Avoid fried foods.  · Eat foods that have probiotics in them. Probiotics can be found in certain dairy products.  · Avoid foods and beverages that may increase the speed at which food moves through the stomach and intestines (gastrointestinal tract). Things to avoid include:  ¨ High-fiber foods, such as dried fruit, raw fruits and vegetables, nuts, seeds, and whole grain foods.  ¨ Spicy foods and high-fat foods.  ¨ Foods and beverages sweetened with high-fructose corn syrup, honey, or sugar alcohols such as xylitol, sorbitol, and mannitol.  WHAT FOODS ARE RECOMMENDED?  Grains  White rice. White, French, or pita breads (fresh or toasted), including plain rolls, buns, or bagels. White pasta. Saltine, soda, or graham crackers. Pretzels. Low-fiber cereal. Cooked cereals made with water (such as cornmeal, farina, or cream cereals). Plain muffins. Matzo. Melba toast. Zwieback.   Vegetables  Potatoes (without the skin). Strained tomato and vegetable juices. Most well-cooked and canned vegetables without seeds. Tender  lettuce.  Fruits  Cooked or canned applesauce, apricots, cherries, fruit cocktail, grapefruit, peaches, pears, or plums. Fresh bananas, apples without skin, cherries, grapes, cantaloupe, grapefruit, peaches, oranges, or plums.   Meat and Other Protein Products  Baked or boiled chicken. Eggs. Tofu. Fish. Seafood. Smooth peanut butter. Ground or well-cooked tender beef, ham, veal, lamb, pork, or poultry.   Dairy  Plain yogurt, kefir, and unsweetened liquid yogurt. Lactose-free milk, buttermilk, or soy milk. Plain hard cheese.  Beverages  Sport drinks. Clear broths. Diluted fruit juices (except prune). Regular, caffeine-free sodas such as ginger ale. Water. Decaffeinated teas. Oral rehydration solutions. Sugar-free beverages not sweetened with sugar alcohols.  Other  Bouillon, broth, or soups made from recommended foods.   The items listed above may not be a complete list of recommended foods or beverages. Contact your dietitian for more options.  WHAT FOODS ARE NOT RECOMMENDED?  Grains  Whole grain, whole wheat, bran, or rye breads, rolls, pastas, crackers, and cereals. Wild or brown rice. Cereals that contain more than 2 g of fiber per   serving. Corn tortillas or taco shells. Cooked or dry oatmeal. Granola. Popcorn.  Vegetables  Raw vegetables. Cabbage, broccoli, Brussels sprouts, artichokes, baked beans, beet greens, corn, kale, legumes, peas, sweet potatoes, and yams. Potato skins. Cooked spinach and cabbage.  Fruits  Dried fruit, including raisins and dates. Raw fruits. Stewed or dried prunes. Fresh apples with skin, apricots, mangoes, pears, raspberries, and strawberries.   Meat and Other Protein Products  Chunky peanut butter. Nuts and seeds. Beans and lentils. Bacon.   Dairy  High-fat cheeses. Milk, chocolate milk, and beverages made with milk, such as milk shakes. Cream. Ice cream.  Sweets and Desserts  Sweet rolls, doughnuts, and sweet breads. Pancakes and waffles.  Fats and Oils  Butter. Cream sauces.  Margarine. Salad oils. Plain salad dressings. Olives. Avocados.   Beverages  Caffeinated beverages (such as coffee, tea, soda, or energy drinks). Alcoholic beverages. Fruit juices with pulp. Prune juice. Soft drinks sweetened with high-fructose corn syrup or sugar alcohols.  Other  Coconut. Hot sauce. Chili powder. Mayonnaise. Gravy. Cream-based or milk-based soups.   The items listed above may not be a complete list of foods and beverages to avoid. Contact your dietitian for more information.  WHAT SHOULD I DO IF I BECOME DEHYDRATED?  Diarrhea can sometimes lead to dehydration. Signs of dehydration include dark urine and dry mouth and skin. If you think you are dehydrated, you should rehydrate with an oral rehydration solution. These solutions can be purchased at pharmacies, retail stores, or online.   Drink ½-1 cup (120-240 mL) of oral rehydration solution each time you have an episode of diarrhea. If drinking this amount makes your diarrhea worse, try drinking smaller amounts more often. For example, drink 1-3 tsp (5-15 mL) every 5-10 minutes.   A general rule for staying hydrated is to drink 1½-2 L of fluid per day. Talk to your health care provider about the specific amount you should be drinking each day. Drink enough fluids to keep your urine clear or pale yellow.  Document Released: 05/15/2003 Document Revised: 02/27/2013 Document Reviewed: 01/15/2013  ExitCare® Patient Information ©2015 [employer]. This information is not intended to replace advice given to you by your health care provider. Make sure you discuss any questions you have with your health care provider.

## 2014-03-10 NOTE — MAU Note (Signed)
Pt was discharged home but feels like she cannot go home in the state that she is in. Pt continues to vomit.

## 2014-03-10 NOTE — MAU Note (Signed)
Discharged patient in room 5 was waiting for the bus but states she wants to be re-seen for vomiting.  Notified Joellyn Haff CNM and re-registered pt.  Pt reports she has been having vomiting the whole pregnancy.

## 2014-03-11 ENCOUNTER — Encounter (HOSPITAL_COMMUNITY): Payer: Self-pay

## 2014-03-11 DIAGNOSIS — O210 Mild hyperemesis gravidarum: Secondary | ICD-10-CM

## 2014-03-11 DIAGNOSIS — O358XX0 Maternal care for other (suspected) fetal abnormality and damage, not applicable or unspecified: Secondary | ICD-10-CM

## 2014-03-11 DIAGNOSIS — O10913 Unspecified pre-existing hypertension complicating pregnancy, third trimester: Secondary | ICD-10-CM

## 2014-03-11 DIAGNOSIS — O4410 Placenta previa with hemorrhage, unspecified trimester: Secondary | ICD-10-CM

## 2014-03-11 LAB — COMPREHENSIVE METABOLIC PANEL
ALT: 12 U/L (ref 0–35)
AST: 20 U/L (ref 0–37)
Albumin: 3 g/dL — ABNORMAL LOW (ref 3.5–5.2)
Alkaline Phosphatase: 138 U/L — ABNORMAL HIGH (ref 39–117)
Anion gap: 15 (ref 5–15)
BILIRUBIN TOTAL: 0.7 mg/dL (ref 0.3–1.2)
BUN: 8 mg/dL (ref 6–23)
CO2: 21 mmol/L (ref 19–32)
Calcium: 9.5 mg/dL (ref 8.4–10.5)
Chloride: 104 mEq/L (ref 96–112)
Creatinine, Ser: 0.62 mg/dL (ref 0.50–1.10)
GLUCOSE: 145 mg/dL — AB (ref 70–99)
Potassium: 3.1 mmol/L — ABNORMAL LOW (ref 3.5–5.1)
Sodium: 140 mmol/L (ref 135–145)
Total Protein: 6.6 g/dL (ref 6.0–8.3)

## 2014-03-11 LAB — ABO/RH: ABO/RH(D): A POS

## 2014-03-11 LAB — TYPE AND SCREEN
ABO/RH(D): A POS
ANTIBODY SCREEN: NEGATIVE

## 2014-03-11 MED ORDER — FAMOTIDINE IN NACL 20-0.9 MG/50ML-% IV SOLN
20.0000 mg | Freq: Two times a day (BID) | INTRAVENOUS | Status: DC
  Administered 2014-03-11: 20 mg via INTRAVENOUS
  Filled 2014-03-11: qty 50

## 2014-03-11 MED ORDER — ACETAMINOPHEN 325 MG PO TABS: 650.0000 mg | ORAL_TABLET | ORAL | Status: DC | PRN

## 2014-03-11 MED ORDER — DOCUSATE SODIUM 100 MG PO CAPS
100.0000 mg | ORAL_CAPSULE | Freq: Every day | ORAL | Status: DC
  Filled 2014-03-11: qty 1

## 2014-03-11 MED ORDER — SERTRALINE HCL 100 MG PO TABS
100.0000 mg | ORAL_TABLET | Freq: Every day | ORAL | Status: DC
  Filled 2014-03-11: qty 1

## 2014-03-11 MED ORDER — PRENATAL MULTIVITAMIN CH
1.0000 | ORAL_TABLET | Freq: Every day | ORAL | Status: DC
  Filled 2014-03-11: qty 1

## 2014-03-11 MED ORDER — ZOLPIDEM TARTRATE 5 MG PO TABS: 5.0000 mg | ORAL_TABLET | Freq: Every evening | ORAL | Status: DC | PRN

## 2014-03-11 MED ORDER — CALCIUM CARBONATE ANTACID 500 MG PO CHEW
2.0000 | CHEWABLE_TABLET | ORAL | Status: DC | PRN
  Filled 2014-03-11: qty 2

## 2014-03-11 NOTE — Progress Notes (Signed)
I received a referral to see patient, but she was discharged before I was able to see her today.  I spoke to one of the nurses involved in her care to get some background information on her situation.  Please page when she comes in for delivery or for other care.  [employer] Pager, [phone] 12:52 PM    03/11/14 1200  Clinical Encounter Type  Visited With Health care provider;Patient not available

## 2014-03-11 NOTE — Discharge Summary (Signed)
Physician Discharge Summary  Patient ID: [name] MRN: [id number] DOB/AGE: 11/08/1980 34 y.o.  Admit date: 03/10/2014 Discharge date: 03/11/2014  Admission Diagnoses:  Discharge Diagnoses:  gastroenteritis Active Problems:   Hyperemesis affecting pregnancy, antepartum   Discharged Condition: stable  Hospital Course: pt respond well to IV hydration and antiemetics. She is now taking po's and retaining. Has not had diarrhea since very early this morning.  Consults: None  Significant Diagnostic Studies: labs stable  Treatments: IV hydration and antiemetics  Discharge Exam: Blood pressure 140/92, pulse 103, temperature 98.8 F (37.1 C), temperature source Oral, resp. rate 18, height  (1.6 m), weight 200 lb (90.719 kg), last menstrual period 07/01/2013. General appearance: alert, cooperative, appears stated age and no distress  Disposition: 01-Home or Self Care     Medication List    ASK your doctor about these medications        aspirin 81 MG chewable tablet  Chew 81 mg by mouth daily.     flintstones complete 60 MG chewable tablet  Chew 2 tablets by mouth daily.     omeprazole 40 MG capsule  Commonly known as:  PRILOSEC  Take 1 capsule (40 mg total) by mouth daily.     ondansetron 4 MG disintegrating tablet  Commonly known as:  ZOFRAN ODT  Take 1-2 tablets (4-8 mg total) by mouth every 8 (eight) hours as needed for nausea or vomiting.     potassium chloride SA 20 MEQ tablet  Commonly known as:  K-DUR,KLOR-CON  Take 2 tablets (40 mEq total) by mouth daily.     promethazine 25 MG tablet  Commonly known as:  PHENERGAN  Take 1 tablet (25 mg total) by mouth every 6 (six) hours as needed for nausea or vomiting.     ranitidine 150 MG tablet  Commonly known as:  ZANTAC  Take 150 mg by mouth 2 (two) times daily.     sertraline 100 MG tablet  Commonly known as:  ZOLOFT  Take 1 tablet (100 mg total) by mouth daily.         SignedFerdie Ping 03/11/2014, 7:24 AM

## 2014-03-11 NOTE — Plan of Care (Signed)
Problem: Consults Goal: Birthing Suites Patient Information Press F2 to bring up selections list  Outcome: Completed/Met Date Met:  03/11/14  Pt < [redacted] weeks EGA and Antenatal Patient (< 37 weeks)

## 2014-03-11 NOTE — MAU Note (Signed)
Pt in room, monitors applied.  

## 2014-03-11 NOTE — Progress Notes (Signed)
Patient ID: [name], female   DOB: 1980/12/01, 34 y.o.   MRN: [id number] S: states is keeping liquids down. Request d/c home. O: vss, FHR pasttern reassuring from prev tracing. No contractions. Heart RRR, LCTAB, abd soft, gravid and non tender. A: gastroenteritis in pregnancy P: D/C home

## 2014-03-11 NOTE — Progress Notes (Signed)
UR chart review completed.  

## 2014-03-11 NOTE — Discharge Instructions (Signed)
Fetal Movement Counts °Patient Name: __________________________________________________ Patient Due Date: ____________________ °Performing a fetal movement count is highly recommended in high-risk pregnancies, but it is good for every pregnant woman to do. Your health care provider may ask you to start counting fetal movements at 28 weeks of the pregnancy. Fetal movements often increase: °· After eating a full meal. °· After physical activity. °· After eating or drinking something sweet or cold. °· At rest. °Pay attention to when you feel the baby is most active. This will help you notice a pattern of your baby's sleep and wake cycles and what factors contribute to an increase in fetal movement. It is important to perform a fetal movement count at the same time each day when your baby is normally most active.  °HOW TO COUNT FETAL MOVEMENTS °1. Find a quiet and comfortable area to sit or lie down on your left side. Lying on your left side provides the best blood and oxygen circulation to your baby. °2. Write down the day and time on a sheet of paper or in a journal. °3. Start counting kicks, flutters, swishes, rolls, or jabs in a 2-hour period. You should feel at least 10 movements within 2 hours. °4. If you do not feel 10 movements in 2 hours, wait 2-3 hours and count again. Look for a change in the pattern or not enough counts in 2 hours. °SEEK MEDICAL CARE IF: °· You feel less than 10 counts in 2 hours, tried twice. °· There is no movement in over an hour. °· The pattern is changing or taking longer each day to reach 10 counts in 2 hours. °· You feel the baby is not moving as he or she usually does. °Date: ____________ Movements: ____________ Start time: ____________ Finish time: ____________  °Date: ____________ Movements: ____________ Start time: ____________ Finish time: ____________ °Date: ____________ Movements: ____________ Start time: ____________ Finish time: ____________ °Date: ____________ Movements:  ____________ Start time: ____________ Finish time: ____________ °Date: ____________ Movements: ____________ Start time: ____________ Finish time: ____________ °Date: ____________ Movements: ____________ Start time: ____________ Finish time: ____________ °Date: ____________ Movements: ____________ Start time: ____________ Finish time: ____________ °Date: ____________ Movements: ____________ Start time: ____________ Finish time: ____________  °Date: ____________ Movements: ____________ Start time: ____________ Finish time: ____________ °Date: ____________ Movements: ____________ Start time: ____________ Finish time: ____________ °Date: ____________ Movements: ____________ Start time: ____________ Finish time: ____________ °Date: ____________ Movements: ____________ Start time: ____________ Finish time: ____________ °Date: ____________ Movements: ____________ Start time: ____________ Finish time: ____________ °Date: ____________ Movements: ____________ Start time: ____________ Finish time: ____________ °Date: ____________ Movements: ____________ Start time: ____________ Finish time: ____________  °Date: ____________ Movements: ____________ Start time: ____________ Finish time: ____________ °Date: ____________ Movements: ____________ Start time: ____________ Finish time: ____________ °Date: ____________ Movements: ____________ Start time: ____________ Finish time: ____________ °Date: ____________ Movements: ____________ Start time: ____________ Finish time: ____________ °Date: ____________ Movements: ____________ Start time: ____________ Finish time: ____________ °Date: ____________ Movements: ____________ Start time: ____________ Finish time: ____________ °Date: ____________ Movements: ____________ Start time: ____________ Finish time: ____________  °Date: ____________ Movements: ____________ Start time: ____________ Finish time: ____________ °Date: ____________ Movements: ____________ Start time: ____________ Finish  time: ____________ °Date: ____________ Movements: ____________ Start time: ____________ Finish time: ____________ °Date: ____________ Movements: ____________ Start time: ____________ Finish time: ____________ °Date: ____________ Movements: ____________ Start time: ____________ Finish time: ____________ °Date: ____________ Movements: ____________ Start time: ____________ Finish time: ____________ °Date: ____________ Movements: ____________ Start time: ____________ Finish time: ____________  °Date: ____________ Movements: ____________ Start time: ____________ Finish   time: ____________ Date: ____________ Movements: ____________ Start time: ____________ Doreatha Martin time: ____________ Date: ____________ Movements: ____________ Start time: ____________ Doreatha Martin time: ____________ Date: ____________ Movements: ____________ Start time: ____________ Doreatha Martin time: ____________ Date: ____________ Movements: ____________ Start time: ____________ Doreatha Martin time: ____________ Date: ____________ Movements: ____________ Start time: ____________ Doreatha Martin time: ____________ Date: ____________ Movements: ____________ Start time: ____________ Doreatha Martin time: ____________  Date: ____________ Movements: ____________ Start time: ____________ Doreatha Martin time: ____________ Date: ____________ Movements: ____________ Start time: ____________ Doreatha Martin time: ____________ Date: ____________ Movements: ____________ Start time: ____________ Doreatha Martin time: ____________ Date: ____________ Movements: ____________ Start time: ____________ Doreatha Martin time: ____________ Date: ____________ Movements: ____________ Start time: ____________ Doreatha Martin time: ____________ Date: ____________ Movements: ____________ Start time: ____________ Doreatha Martin time: ____________ Date: ____________ Movements: ____________ Start time: ____________ Doreatha Martin time: ____________  Date: ____________ Movements: ____________ Start time: ____________ Doreatha Martin time: ____________ Date: ____________  Movements: ____________ Start time: ____________ Doreatha Martin time: ____________ Date: ____________ Movements: ____________ Start time: ____________ Doreatha Martin time: ____________ Date: ____________ Movements: ____________ Start time: ____________ Doreatha Martin time: ____________ Date: ____________ Movements: ____________ Start time: ____________ Doreatha Martin time: ____________ Date: ____________ Movements: ____________ Start time: ____________ Doreatha Martin time: ____________ Date: ____________ Movements: ____________ Start time: ____________ Doreatha Martin time: ____________  Date: ____________ Movements: ____________ Start time: ____________ Doreatha Martin time: ____________ Date: ____________ Movements: ____________ Start time: ____________ Doreatha Martin time: ____________ Date: ____________ Movements: ____________ Start time: ____________ Doreatha Martin time: ____________ Date: ____________ Movements: ____________ Start time: ____________ Doreatha Martin time: ____________ Date: ____________ Movements: ____________ Start time: ____________ Doreatha Martin time: ____________ Date: ____________ Movements: ____________ Start time: ____________ Doreatha Martin time: ____________ Document Released: 03/24/2006 Document Revised: 07/09/2013 Document Reviewed: 12/20/2011 [employer] Patient Information 2015 [employer]. This information is not intended to replace advice given to you by your health care provider. Make sure you discuss any questions you have with your health care provider. Nausea and Vomiting Nausea is a sick feeling that often comes before throwing up (vomiting). Vomiting is a reflex where stomach contents come out of your mouth. Vomiting can cause severe loss of body fluids (dehydration). Children and elderly adults can become dehydrated quickly, especially if they also have diarrhea. Nausea and vomiting are symptoms of a condition or disease. It is important to find the cause of your symptoms. CAUSES   Direct irritation of the stomach lining. This irritation can result  from increased acid production (gastroesophageal reflux disease), infection, food poisoning, taking certain medicines (such as nonsteroidal anti-inflammatory drugs), alcohol use, or tobacco use.  Signals from the brain.These signals could be caused by a headache, heat exposure, an inner ear disturbance, increased pressure in the brain from injury, infection, a tumor, or a concussion, pain, emotional stimulus, or metabolic problems.  An obstruction in the gastrointestinal tract (bowel obstruction).  Illnesses such as diabetes, hepatitis, gallbladder problems, appendicitis, kidney problems, cancer, sepsis, atypical symptoms of a heart attack, or eating disorders.  Medical treatments such as chemotherapy and radiation.  Receiving medicine that makes you sleep (general anesthetic) during surgery. DIAGNOSIS Your caregiver may ask for tests to be done if the problems do not improve after a few days. Tests may also be done if symptoms are severe or if the reason for the nausea and vomiting is not clear. Tests may include: 5. Urine tests. 6. Blood tests. 7. Stool tests. 8. Cultures (to look for evidence of infection). 9. X-rays or other imaging studies. Test results can help your caregiver make decisions about treatment or the need for additional tests. TREATMENT You need to stay well hydrated. Drink frequently but in small amounts.You  may wish to drink water, sports drinks, clear broth, or eat frozen ice pops or gelatin dessert to help stay hydrated.When you eat, eating slowly may help prevent nausea.There are also some antinausea medicines that may help prevent nausea. HOME CARE INSTRUCTIONS   Take all medicine as directed by your caregiver.  If you do not have an appetite, do not force yourself to eat. However, you must continue to drink fluids.  If you have an appetite, eat a normal diet unless your caregiver tells you differently.  Eat a variety of complex carbohydrates (rice, wheat,  potatoes, bread), lean meats, yogurt, fruits, and vegetables.  Avoid high-fat foods because they are more difficult to digest.  Drink enough water and fluids to keep your urine clear or pale yellow.  If you are dehydrated, ask your caregiver for specific rehydration instructions. Signs of dehydration may include:  Severe thirst.  Dry lips and mouth.  Dizziness.  Dark urine.  Decreasing urine frequency and amount.  Confusion.  Rapid breathing or pulse. SEEK IMMEDIATE MEDICAL CARE IF:   You have blood or brown flecks (like coffee grounds) in your vomit.  You have black or bloody stools.  You have a severe headache or stiff neck.  You are confused.  You have severe abdominal pain.  You have chest pain or trouble breathing.  You do not urinate at least once every 8 hours.  You develop cold or clammy skin.  You continue to vomit for longer than 24 to 48 hours.  You have a fever. MAKE SURE YOU:   Understand these instructions.  Will watch your condition.  Will get help right away if you are not doing well or get worse. Document Released: 02/22/2005 Document Revised: 05/17/2011 Document Reviewed: 07/22/2010 [facility] Patient Information 2015 [place], [place]. This information is not intended to replace advice given to you by your health care provider. Make sure you discuss any questions you have with your health care provider. Braxton Hicks Contractions Contractions of the uterus can occur throughout pregnancy. Contractions are not always a sign that you are in labor.  WHAT ARE BRAXTON HICKS CONTRACTIONS?  Contractions that occur before labor are called Braxton Hicks contractions, or false labor. Toward the end of pregnancy (32-34 weeks), these contractions can develop more often and may become more forceful. This is not true labor because these contractions do not result in opening (dilatation) and thinning of the cervix. They are sometimes difficult to tell apart from  true labor because these contractions can be forceful and people have different pain tolerances. You should not feel embarrassed if you go to the hospital with false labor. Sometimes, the only way to tell if you are in true labor is for your health care provider to look for changes in the cervix. If there are no prenatal problems or other health problems associated with the pregnancy, it is completely safe to be sent home with false labor and await the onset of true labor. HOW CAN YOU TELL THE DIFFERENCE BETWEEN TRUE AND FALSE LABOR? False Labor  The contractions of false labor are usually shorter and not as hard as those of true labor.   The contractions are usually irregular.   The contractions are often felt in the front of the lower abdomen and in the groin.   The contractions may go away when you walk around or change positions while lying down.   The contractions get weaker and are shorter lasting as time goes on.   The contractions do  not usually become progressively stronger, regular, and closer together as with true labor.  True Labor 10. Contractions in true labor last 30-70 seconds, become very regular, usually become more intense, and increase in frequency.  11. The contractions do not go away with walking.  12. The discomfort is usually felt in the top of the uterus and spreads to the lower abdomen and low back.  13. True labor can be determined by your health care provider with an exam. This will show that the cervix is dilating and getting thinner.  WHAT TO REMEMBER  Keep up with your usual exercises and follow other instructions given by your health care provider.   Take medicines as directed by your health care provider.   Keep your regular prenatal appointments.   Eat and drink lightly if you think you are going into labor.   If Braxton Hicks contractions are making you uncomfortable:   Change your position from lying down or resting to walking, or  from walking to resting.   Sit and rest in a tub of warm water.   Drink 2-3 glasses of water. Dehydration may cause these contractions.   Do slow and deep breathing several times an hour.  WHEN SHOULD I SEEK IMMEDIATE MEDICAL CARE? Seek immediate medical care if:  Your contractions become stronger, more regular, and closer together.   You have fluid leaking or gushing from your vagina.   You have a fever.   You pass blood-tinged mucus.   You have vaginal bleeding.   You have continuous abdominal pain.   You have low back pain that you never had before.   You feel your baby's head pushing down and causing pelvic pressure.   Your baby is not moving as much as it used to.  Document Released: 02/22/2005 Document Revised: 02/27/2013 Document Reviewed: 12/04/2012 [facility] Patient Information 2015 [place], [place]. This information is not intended to replace advice given to you by your health care provider. Make sure you discuss any questions you have with your health care provider.

## 2014-03-11 NOTE — MAU Note (Signed)
Pt refuses to turn to a position where baby can be monitored.

## 2014-03-13 ENCOUNTER — Inpatient Hospital Stay (HOSPITAL_COMMUNITY)
Admission: AD | Admit: 2014-03-13 | Discharge: 2014-03-13 | Disposition: A | Discharge status: Home / Self Care | Payer: Medicaid Other | Source: Ambulatory Visit | Attending: Family Medicine | Admitting: Family Medicine

## 2014-03-13 ENCOUNTER — Encounter (HOSPITAL_COMMUNITY): Payer: Self-pay | Admitting: *Deleted

## 2014-03-13 DIAGNOSIS — O99613 Diseases of the digestive system complicating pregnancy, third trimester: Secondary | ICD-10-CM | POA: Diagnosis not present

## 2014-03-13 DIAGNOSIS — K219 Gastro-esophageal reflux disease without esophagitis: Secondary | ICD-10-CM | POA: Diagnosis not present

## 2014-03-13 DIAGNOSIS — O10013 Pre-existing essential hypertension complicating pregnancy, third trimester: Secondary | ICD-10-CM | POA: Insufficient documentation

## 2014-03-13 DIAGNOSIS — O4703 False labor before 37 completed weeks of gestation, third trimester: Secondary | ICD-10-CM

## 2014-03-13 DIAGNOSIS — O479 False labor, unspecified: Secondary | ICD-10-CM

## 2014-03-13 DIAGNOSIS — Z3A36 36 weeks gestation of pregnancy: Secondary | ICD-10-CM | POA: Insufficient documentation

## 2014-03-13 HISTORY — DX: Personal history of suicidal behavior: Z91.51

## 2014-03-13 HISTORY — DX: Personal history of self-harm: Z91.5

## 2014-03-13 HISTORY — DX: Alcohol abuse, uncomplicated: F10.10

## 2014-03-13 MED ORDER — HYDROMORPHONE HCL 1 MG/ML IJ SOLN: 1.0000 mg | Freq: Once | INTRAMUSCULAR | Status: DC

## 2014-03-13 MED ORDER — GI COCKTAIL ~~LOC~~
30.0000 mL | Freq: Once | ORAL | Status: AC
  Administered 2014-03-13: 30 mL via ORAL
  Filled 2014-03-13: qty 30

## 2014-03-13 MED ORDER — HYDROMORPHONE HCL 1 MG/ML IJ SOLN
0.5000 mg | Freq: Once | INTRAMUSCULAR | Status: AC
  Administered 2014-03-13: 0.5 mg via INTRAMUSCULAR
  Filled 2014-03-13: qty 1

## 2014-03-13 MED ORDER — HYDROMORPHONE HCL 1 MG/ML IJ SOLN: 0.5000 mg | Freq: Once | INTRAMUSCULAR | Status: DC

## 2014-03-13 NOTE — Discharge Instructions (Signed)
Braxton Hicks Contractions °Contractions of the uterus can occur throughout pregnancy. Contractions are not always a sign that you are in labor.  °WHAT ARE BRAXTON HICKS CONTRACTIONS?  °Contractions that occur before labor are called Braxton Hicks contractions, or false labor. Toward the end of pregnancy (32-34 weeks), these contractions can develop more often and may become more forceful. This is not true labor because these contractions do not result in opening (dilatation) and thinning of the cervix. They are sometimes difficult to tell apart from true labor because these contractions can be forceful and people have different pain tolerances. You should not feel embarrassed if you go to the hospital with false labor. Sometimes, the only way to tell if you are in true labor is for your health care provider to look for changes in the cervix. °If there are no prenatal problems or other health problems associated with the pregnancy, it is completely safe to be sent home with false labor and await the onset of true labor. °HOW CAN YOU TELL THE DIFFERENCE BETWEEN TRUE AND FALSE LABOR? °False Labor °· The contractions of false labor are usually shorter and not as hard as those of true labor.   °· The contractions are usually irregular.   °· The contractions are often felt in the front of the lower abdomen and in the groin.   °· The contractions may go away when you walk around or change positions while lying down.   °· The contractions get weaker and are shorter lasting as time goes on.   °· The contractions do not usually become progressively stronger, regular, and closer together as with true labor.   °True Labor °· Contractions in true labor last 30-70 seconds, become very regular, usually become more intense, and increase in frequency.   °· The contractions do not go away with walking.   °· The discomfort is usually felt in the top of the uterus and spreads to the lower abdomen and low back.   °· True labor can be  determined by your health care provider with an exam. This will show that the cervix is dilating and getting thinner.   °WHAT TO REMEMBER °· Keep up with your usual exercises and follow other instructions given by your health care provider.   °· Take medicines as directed by your health care provider.   °· Keep your regular prenatal appointments.   °· Eat and drink lightly if you think you are going into labor.   °· If Braxton Hicks contractions are making you uncomfortable:   °¨ Change your position from lying down or resting to walking, or from walking to resting.   °¨ Sit and rest in a tub of warm water.   °¨ Drink 2-3 glasses of water. Dehydration may cause these contractions.   °¨ Do slow and deep breathing several times an hour.   °WHEN SHOULD I SEEK IMMEDIATE MEDICAL CARE? °Seek immediate medical care if: °· Your contractions become stronger, more regular, and closer together.   °· You have fluid leaking or gushing from your vagina.   °· You have a fever.   °· You pass blood-tinged mucus.   °· You have vaginal bleeding.   °· You have continuous abdominal pain.   °· You have low back pain that you never had before.   °· You feel your baby's head pushing down and causing pelvic pressure.   °· Your baby is not moving as much as it used to.   °Document Released: 02/22/2005 Document Revised: 02/27/2013 Document Reviewed: 12/04/2012 °[employer]® Patient Information ©2015 [employer]. This information is not intended to replace advice given to you by your health care   provider. Make sure you discuss any questions you have with your health care provider. ° °

## 2014-03-13 NOTE — Progress Notes (Signed)
Cab voucher given and explained that this is a one time deal.

## 2014-03-13 NOTE — MAU Note (Signed)
Pt states that she is not leaving. Pt insists that she is pre-eclamptic and the providers are not listening to her. Pt asks to be admitted for 24 hour observation. Pt states that her current complaints are her stomach tightening up and reflux pain.

## 2014-03-13 NOTE — MAU Provider Note (Addendum)
History     CSN: [id number]  Arrival date and time: 03/13/14 1347   None     Chief Complaint  Patient presents with  . Labor Eval   HPI THis is a 34 y.o. female at 6117w3d who presents with contractions.  Has been seen frequently for this. States her "doctor in the clinic told me today to come up here and have you induce my labor".  States she is high risk because of Hypertension, hx of preeclampsia, and hx LEEP. States she cannot go home.   RN Note: Arrived via EMS with C/O contractions. Was seen in MAU 2 days ago.          OB History    Gravida Para Term Preterm AB TAB SAB Ectopic Multiple Living   5 2 2  0 2  2   2       Past Medical History  Diagnosis Date  . GERD (gastroesophageal reflux disease)   . Acid reflux   . Hypertension   . Anemia   . Depression   . Vaginal Pap smear, abnormal   . Alcohol abuse   . H/O suicide attempt     Past Surgical History  Procedure Laterality Date  . Leep      Family History  Problem Relation Age of Onset  . Heart disease Mother   . Hyperlipidemia Mother   . Hypertension Mother   . Stroke Mother   . Asthma Sister   . Asthma Son     History  Substance Use Topics  . Smoking status: Never Smoker   . Smokeless tobacco: Never Used  . Alcohol Use: No     Comment: Hx alcohol abuse    Allergies: No Known Allergies  Prescriptions prior to admission  Medication Sig Dispense Refill Last Dose  . aspirin 81 MG chewable tablet Chew 81 mg by mouth daily.   Past Week at Unknown time  . flintstones complete (FLINTSTONES) 60 MG chewable tablet Chew 2 tablets by mouth daily.   03/13/2014 at Unknown time  . ondansetron (ZOFRAN ODT) 4 MG disintegrating tablet Take 1-2 tablets (4-8 mg total) by mouth every 8 (eight) hours as needed for nausea or vomiting. 20 tablet 0 03/12/2014 at Unknown time  . promethazine (PHENERGAN) 25 MG tablet Take 1 tablet (25 mg total) by mouth every 6 (six) hours as needed for nausea or vomiting. 30 tablet 5  03/12/2014 at Unknown time  . ranitidine (ZANTAC) 150 MG tablet Take 150 mg by mouth 2 (two) times daily.    Past Week at Unknown time  . scopolamine (TRANSDERM-SCOP) 1 MG/3DAYS Place 1 patch onto the skin every 3 (three) days.   03/12/2014 at Unknown time  . sertraline (ZOLOFT) 100 MG tablet Take 1 tablet (100 mg total) by mouth daily. 30 tablet 3 Past Week at Unknown time  . omeprazole (PRILOSEC) 40 MG capsule Take 1 capsule (40 mg total) by mouth daily. (Patient not taking: Reported on 03/13/2014) 30 capsule 5 Not Taking at Unknown time  . potassium chloride SA (K-DUR,KLOR-CON) 20 MEQ tablet Take 2 tablets (40 mEq total) by mouth daily. (Patient not taking: Reported on 03/13/2014) 30 tablet 2 Not Taking at Unknown time    Review of Systems  Constitutional: Negative for fever, chills and malaise/fatigue.  Gastrointestinal: Positive for abdominal pain. Negative for nausea, vomiting, diarrhea and constipation.  Neurological: Negative for dizziness.   Physical Exam   Blood pressure 139/75, pulse 108, temperature 98.5 F (36.9 C), temperature source Oral, resp.  rate 20, last menstrual period 07/01/2013, SpO2 100 %.  Physical Exam  Constitutional: She is oriented to person, place, and time. She appears well-developed and well-nourished. No distress (but uncomfortable with contractions, rolling around and moaning).  HENT:  Head: Normocephalic.  Cardiovascular: Normal rate.   Respiratory: Effort normal.  GI: Soft. She exhibits no distension and no mass. There is no tenderness. There is no rebound and no guarding.  Gravid, FHR reactive, UCs every 3-5 min  Genitourinary: Vagina normal. No vaginal discharge found.  Dilation: Closed Effacement (%): Thick Cervical Position: Posterior Station: Ballotable Presentation: Vertex Exam by:: Artelia Laroche, CNM   Musculoskeletal: Normal range of motion.  Neurological: She is alert and oriented to person, place, and time.  Skin: Skin is warm and dry.   Psychiatric: She has a normal mood and affect.    MAU Course  Procedures  MDM Has had three exams with no change over 5.5 hours. Refuses to go home Insists on talking to the doctor. I did try to call the clinic earlier to verify what she was saying but no answer. Dr Despina Hidden came and had an extensive conversation about not being in labor, no change in cx, No indication for IOL, preterm status. Offered Terb to stop UCs so she would not be hurting. Did get a shot for pain with relief earlier. His final assessment is to recheck one last time at 8pm. If not in labor, d/c home   Assessment and Plan  Will report to oncoming CNM at 8pm  [facility] 03/13/2014, 7:20 PM    Had onset of mid-sternal chest discomfort shortly after being told she would be discharged. VSS- BP 136/87. Given GI cocktail for good relief.  EKG: sinus tach FHR 140s, +accels, no decels Ctx q 3-5 mins Cx deferred by me (@ 2020 was FT/thick)  IUP@36 .3wks Braxton Hicks ctx  D/C home with labor precautions Pt refused to be discharged for reasons such as transportation issues and also still with tightening in abd- will have RN f/u on this with [facility] RN F/U as scheduled at [facility] Dr Despina Hidden in agreement  Cam Hai 03/13/2014 10:11 PM

## 2014-03-13 NOTE — MAU Note (Signed)
Respiratory paged for EKG

## 2014-03-13 NOTE — Progress Notes (Signed)
22:15 - Was ask to speak with patient by the Joni ReiningNicole RN because patient is refusing to leave once she was discharged.  Patient requested to be admitted for observation.  I spoke with Philipp DeputyKim Shaw CNM about a possible admit for observation.  Selena BattenKim stated that there is not a reason for observation. Offered additional tocolytlic to assist with decreasing the frequency of contractions. Pt states "I don't want to stop the contractions I want to just have this baby."  Explained to pt. That she is not term and that the practitioners do not want to do anything that will stimulate her labor.  The time spent with patient was about 45 min.  Patient agreed to be discharged.

## 2014-03-13 NOTE — MAU Note (Addendum)
Arrived via EMS with C/O contractions. Was seen in MAU 2 days ago.

## 2014-03-14 ENCOUNTER — Other Ambulatory Visit: Payer: Medicaid Other

## 2014-03-19 ENCOUNTER — Encounter: Payer: Self-pay | Admitting: General Practice

## 2014-03-20 ENCOUNTER — Ambulatory Visit (HOSPITAL_COMMUNITY): Admission: RE | Admit: 2014-03-20 | Payer: Medicaid Other | Source: Ambulatory Visit

## 2014-03-20 ENCOUNTER — Telehealth (HOSPITAL_COMMUNITY): Payer: Self-pay | Admitting: *Deleted

## 2014-03-22 ENCOUNTER — Other Ambulatory Visit: Payer: Medicaid Other

## 2014-03-22 ENCOUNTER — Telehealth: Payer: Self-pay | Admitting: *Deleted

## 2014-03-22 NOTE — Telephone Encounter (Signed)
Called pt yesterday @ 1702 regarding today's appt. A woman answered the home phone # and stated that Yetta BarreShakeema could be reached on her cell phone. When I called that number, I got a voice mail. I left message stating that I was reminding her of important appt and gave the details. I asked if she would try to arrive @ 1030 instead of 1100 in order to have enough time to complete her visit. I also stated that she may call back if she had questions.

## 2014-03-24 ENCOUNTER — Inpatient Hospital Stay (HOSPITAL_COMMUNITY)
Admission: AD | Admit: 2014-03-24 | Discharge: 2014-03-24 | Disposition: A | Discharge status: Home / Self Care | Payer: Medicaid Other | Source: Ambulatory Visit | Attending: Obstetrics & Gynecology | Admitting: Obstetrics & Gynecology

## 2014-03-24 ENCOUNTER — Encounter (HOSPITAL_COMMUNITY): Payer: Self-pay | Admitting: *Deleted

## 2014-03-24 DIAGNOSIS — O358XX1 Maternal care for other (suspected) fetal abnormality and damage, fetus 1: Secondary | ICD-10-CM

## 2014-03-24 DIAGNOSIS — K219 Gastro-esophageal reflux disease without esophagitis: Secondary | ICD-10-CM | POA: Insufficient documentation

## 2014-03-24 DIAGNOSIS — Z3A38 38 weeks gestation of pregnancy: Secondary | ICD-10-CM | POA: Diagnosis not present

## 2014-03-24 DIAGNOSIS — O99613 Diseases of the digestive system complicating pregnancy, third trimester: Secondary | ICD-10-CM | POA: Diagnosis not present

## 2014-03-24 DIAGNOSIS — O09293 Supervision of pregnancy with other poor reproductive or obstetric history, third trimester: Secondary | ICD-10-CM

## 2014-03-24 DIAGNOSIS — Z9889 Other specified postprocedural states: Secondary | ICD-10-CM

## 2014-03-24 DIAGNOSIS — O471 False labor at or after 37 completed weeks of gestation: Secondary | ICD-10-CM | POA: Insufficient documentation

## 2014-03-24 DIAGNOSIS — N390 Urinary tract infection, site not specified: Secondary | ICD-10-CM

## 2014-03-24 DIAGNOSIS — O10013 Pre-existing essential hypertension complicating pregnancy, third trimester: Secondary | ICD-10-CM | POA: Insufficient documentation

## 2014-03-24 DIAGNOSIS — O2343 Unspecified infection of urinary tract in pregnancy, third trimester: Secondary | ICD-10-CM | POA: Diagnosis not present

## 2014-03-24 DIAGNOSIS — O3443 Maternal care for other abnormalities of cervix, third trimester: Secondary | ICD-10-CM

## 2014-03-24 DIAGNOSIS — O35BXX1 Maternal care for other (suspected) fetal abnormality and damage, fetal cardiac anomalies, fetus 1: Secondary | ICD-10-CM

## 2014-03-24 DIAGNOSIS — O10913 Unspecified pre-existing hypertension complicating pregnancy, third trimester: Secondary | ICD-10-CM

## 2014-03-24 DIAGNOSIS — O4420 Partial placenta previa NOS or without hemorrhage, unspecified trimester: Secondary | ICD-10-CM

## 2014-03-24 LAB — URINALYSIS, ROUTINE W REFLEX MICROSCOPIC
Glucose, UA: NEGATIVE mg/dL
HGB URINE DIPSTICK: NEGATIVE
Ketones, ur: 40 mg/dL — AB
Nitrite: POSITIVE — AB
PH: 6.5 (ref 5.0–8.0)
Protein, ur: 30 mg/dL — AB
Specific Gravity, Urine: 1.025 (ref 1.005–1.030)
Urobilinogen, UA: 2 mg/dL — ABNORMAL HIGH (ref 0.0–1.0)

## 2014-03-24 LAB — URINE MICROSCOPIC-ADD ON

## 2014-03-24 MED ORDER — CEFTRIAXONE SODIUM IN DEXTROSE 20 MG/ML IV SOLN
1.0000 g | Freq: Once | INTRAVENOUS | Status: DC
  Filled 2014-03-24: qty 50

## 2014-03-24 MED ORDER — LACTATED RINGERS IV BOLUS (SEPSIS): 1000.0000 mL | Freq: Once | INTRAVENOUS | Status: DC

## 2014-03-24 MED ORDER — NITROFURANTOIN MONOHYD MACRO 100 MG PO CAPS: 100.0000 mg | ORAL_CAPSULE | Freq: Two times a day (BID) | ORAL | Status: DC

## 2014-03-24 NOTE — MAU Provider Note (Signed)
History  Chief Complaint:  Labor Eval  LEXEE BRASHEARS is a 34 y.o. [phone] female at [redacted]w[redacted]d presenting by EMS with complaint of contractions x 2 weeks.  Reports active fetal movement, contractions: regular, vaginal bleeding: none, membranes: intact. Denies uti s/s, abnormal/malodorous vag d/c or vulvovaginal itching/irritation.   [facility]atal [facility] at [facility].  Missed her last OB appointment on Friday, due to lack of transportation, and has not yet scheduled another. Pregnancy complicated by hyperemesis, GERD, chronic hypertension, history of preeclampsia in a prior pregnancy, previous preterm delivery, substance abuse (alcohol & THC), and depression.  Obstetrical History: OB History    Gravida Para Term Preterm AB TAB SAB Ectopic Multiple Living   0 Past Medical History: Past Medical History  Diagnosis Date  . GERD (gastroesophageal reflux disease)   . Acid reflux   . Hypertension   . Anemia   . Depression   . Vaginal Pap smear, abnormal   . Alcohol abuse   . H/O suicide attempt     Past Surgical History: Past Surgical History  Procedure Laterality Date  . Leep      Social History: History   Social History  . Marital Status: Single    Spouse Name: N/A    Number of Children: N/A  . Years of Education: N/A   Social History Main Topics  . Smoking status: Never Smoker   . Smokeless tobacco: Never Used  . Alcohol Use: No     Comment: Hx alcohol abuse  . Drug Use: No     Comment: occasionally  . Sexual Activity: Yes    Birth Control/ Protection: None   Other Topics Concern  . None   Social History Narrative    Allergies: No Known Allergies  Prescriptions prior to admission  Medication Sig Dispense Refill Last Dose  . aspirin 81 MG chewable tablet Chew 81 mg by mouth daily.   03/24/2014 at Unknown time  . flintstones complete (FLINTSTONES) 60 MG chewable tablet Chew 2 tablets by mouth daily.   03/24/2014 at Unknown time  . ondansetron (ZOFRAN  ODT) 4 MG disintegrating tablet Take 1-2 tablets (4-8 mg total) by mouth every 8 (eight) hours as needed for nausea or vomiting. 20 tablet 0 Past Week at Unknown time  . potassium chloride (KLOR-CON) 20 MEQ packet Take 20 mEq by mouth 2 (two) times daily.   03/24/2014 at Unknown time  . promethazine (PHENERGAN) 25 MG tablet Take 1 tablet (25 mg total) by mouth every 6 (six) hours as needed for nausea or vomiting. 30 tablet 5 03/24/2014 at Unknown time  . ranitidine (ZANTAC) 150 MG tablet Take 150 mg by mouth 2 (two) times daily.    03/24/2014 at Unknown time  . scopolamine (TRANSDERM-SCOP) 1 MG/3DAYS Place 1 patch onto the skin every 3 (three) days.   Past Week at Unknown time  . sertraline (ZOLOFT) 100 MG tablet Take 1 tablet (100 mg total) by mouth daily. 30 tablet 3 03/24/2014 at Unknown time    Review of Systems  Pertinent pos/neg as indicated in HPI  Physical Exam  Blood pressure 119/85, pulse 102, temperature 97.9 F (36.6 C), temperature source Oral, resp. rate 20, height  (1.6 m), weight 90.719 kg (200 lb), last menstrual period 07/01/2013. General appearance: alert and moderate distress Heart: regular rate and rhythm Abdomen: gravid, soft, non-tender Extremities: no edema  Cultures/Specimens: U/A  Dilation: 1 (Provider at nurses' station and  informed of VE) Effacement (%): 60 Cervical Position: Posterior Station: Ballotable Presentation: Vertex Exam by:: Dellie BurnsScarlett Murray, RN BSN Presentation: unsure  Fetal monitoring: FHR: 135 bpm, variability: moderate,  Accelerations: Present,  decelerations:  Absent Uterine activity: irregular contractions every 1-8 minutes  MAU Course  Labor check by RN, no cervical change in 1.5 hours.  Called to see patient because she is reluctant to be discharged.  Explained to patient that she is not in labor, as there has been no cervical change.  Patient then complains of n/v, and inability to keep food/liquids down "for weeks".  U/A sent, see  abnormal results below. Patient refused IV hydration & antibiotics & requests to be discharged with oral antibiotics.  Discharged to home on Macrobid.  Labs:  Results for orders placed or performed during the hospital encounter of 03/24/14 (from the past 24 hour(s))  Urinalysis, Routine w reflex microscopic     Status: Abnormal   Collection Time: 03/24/14  4:20 PM  Result Value Ref Range   Color, Urine AMBER (A) YELLOW   APPearance HAZY (A) CLEAR   Specific Gravity, Urine 1.025 1.005 - 1.030   pH 6.5 5.0 - 8.0   Glucose, UA NEGATIVE NEGATIVE mg/dL   Hgb urine dipstick NEGATIVE NEGATIVE   Bilirubin Urine MODERATE (A) NEGATIVE   Ketones, ur 40 (A) NEGATIVE mg/dL   Protein, ur 30 (A) NEGATIVE mg/dL   Urobilinogen, UA 2.0 (H) 0.0 - 1.0 mg/dL   Nitrite POSITIVE (A) NEGATIVE   Leukocytes, UA SMALL (A) NEGATIVE  Urine microscopic-add on     Status: Abnormal   Collection Time: 03/24/14  4:20 PM  Result Value Ref Range   Squamous Epithelial / LPF FEW (A) RARE   WBC, UA 7-10 <3 WBC/hpf   RBC / HPF 0-2 <3 RBC/hpf   Bacteria, UA FEW (A) RARE   Urine-Other MUCOUS PRESENT      Assessment and Plan  A:  5664w0d SIUP  [id number]  Mild, irregular contractions without cervical change  Cat 1 FHR  UTI P:  Discharge to home  Macrobid 100 mg po bid x 10d  Encouraged po hydration  Reviewed s/s labor, fetal movement  [facility]   Felton ClintonRoss,Shiana Rappleye Wynne SNM 1/17/20165:28 PM

## 2014-03-24 NOTE — Discharge Instructions (Signed)

## 2014-03-24 NOTE — MAU Note (Signed)
[facility] complaining of contractions X 2 weeks. States she has a white discharge but denies bleeding and reports decreased fetal movement.

## 2014-03-25 LAB — CULTURE, OB URINE
Colony Count: 30000
Special Requests: NORMAL

## 2014-03-26 ENCOUNTER — Encounter (HOSPITAL_COMMUNITY): Payer: Self-pay | Admitting: *Deleted

## 2014-03-26 ENCOUNTER — Inpatient Hospital Stay (HOSPITAL_COMMUNITY)
Admission: AD | Admit: 2014-03-26 | Discharge: 2014-03-27 | DRG: 774 | Disposition: A | Discharge status: Home / Self Care | Payer: Medicaid Other | Source: Ambulatory Visit | Attending: Family Medicine | Admitting: Family Medicine

## 2014-03-26 DIAGNOSIS — O99344 Other mental disorders complicating childbirth: Secondary | ICD-10-CM | POA: Diagnosis present

## 2014-03-26 DIAGNOSIS — K219 Gastro-esophageal reflux disease without esophagitis: Secondary | ICD-10-CM | POA: Diagnosis present

## 2014-03-26 DIAGNOSIS — Z3A38 38 weeks gestation of pregnancy: Secondary | ICD-10-CM | POA: Diagnosis not present

## 2014-03-26 DIAGNOSIS — Z915 Personal history of self-harm: Secondary | ICD-10-CM | POA: Diagnosis not present

## 2014-03-26 DIAGNOSIS — IMO0001 Reserved for inherently not codable concepts without codable children: Secondary | ICD-10-CM

## 2014-03-26 DIAGNOSIS — O9882 Other maternal infectious and parasitic diseases complicating childbirth: Secondary | ICD-10-CM | POA: Diagnosis present

## 2014-03-26 DIAGNOSIS — L988 Other specified disorders of the skin and subcutaneous tissue: Secondary | ICD-10-CM | POA: Diagnosis present

## 2014-03-26 DIAGNOSIS — F329 Major depressive disorder, single episode, unspecified: Secondary | ICD-10-CM | POA: Diagnosis present

## 2014-03-26 LAB — CBC
HEMATOCRIT: 29.1 % — AB (ref 36.0–46.0)
HEMOGLOBIN: 9.8 g/dL — AB (ref 12.0–15.0)
MCH: 29.5 pg (ref 26.0–34.0)
MCHC: 33.7 g/dL (ref 30.0–36.0)
MCV: 87.7 fL (ref 78.0–100.0)
Platelets: 262 10*3/uL (ref 150–400)
RBC: 3.32 MIL/uL — AB (ref 3.87–5.11)
RDW: 13.8 % (ref 11.5–15.5)
WBC: 10.3 10*3/uL (ref 4.0–10.5)

## 2014-03-26 LAB — RAPID URINE DRUG SCREEN, HOSP PERFORMED
Amphetamines: NOT DETECTED
Barbiturates: NOT DETECTED
Benzodiazepines: NOT DETECTED
Cocaine: NOT DETECTED
OPIATES: NOT DETECTED
Tetrahydrocannabinol: POSITIVE — AB

## 2014-03-26 LAB — TYPE AND SCREEN
ABO/RH(D): A POS
ANTIBODY SCREEN: NEGATIVE

## 2014-03-26 MED ORDER — OXYTOCIN 40 UNITS IN LACTATED RINGERS INFUSION - SIMPLE MED: 62.5000 mL/h | INTRAVENOUS | Status: DC

## 2014-03-26 MED ORDER — LACTATED RINGERS IV SOLN: 500.0000 mL | INTRAVENOUS | Status: DC | PRN

## 2014-03-26 MED ORDER — OXYCODONE-ACETAMINOPHEN 5-325 MG PO TABS: 2.0000 | ORAL_TABLET | ORAL | Status: DC | PRN

## 2014-03-26 MED ORDER — LIDOCAINE HCL (PF) 1 % IJ SOLN
30.0000 mL | INTRAMUSCULAR | Status: DC | PRN
  Filled 2014-03-26: qty 30

## 2014-03-26 MED ORDER — WITCH HAZEL-GLYCERIN EX PADS: 1.0000 "application " | MEDICATED_PAD | CUTANEOUS | Status: DC | PRN

## 2014-03-26 MED ORDER — ONDANSETRON HCL 4 MG/2ML IJ SOLN: 4.0000 mg | Freq: Four times a day (QID) | INTRAMUSCULAR | Status: DC | PRN

## 2014-03-26 MED ORDER — OXYCODONE-ACETAMINOPHEN 5-325 MG PO TABS: 1.0000 | ORAL_TABLET | ORAL | Status: DC | PRN

## 2014-03-26 MED ORDER — POTASSIUM CHLORIDE CRYS ER 20 MEQ PO TBCR
20.0000 meq | EXTENDED_RELEASE_TABLET | Freq: Two times a day (BID) | ORAL | Status: DC
  Filled 2014-03-26: qty 1

## 2014-03-26 MED ORDER — POTASSIUM CHLORIDE 20 MEQ PO PACK
20.0000 meq | PACK | Freq: Two times a day (BID) | ORAL | Status: DC
  Filled 2014-03-26: qty 1

## 2014-03-26 MED ORDER — OXYTOCIN 40 UNITS IN LACTATED RINGERS INFUSION - SIMPLE MED
INTRAVENOUS | Status: AC
  Filled 2014-03-26: qty 1000

## 2014-03-26 MED ORDER — SIMETHICONE 80 MG PO CHEW: 80.0000 mg | CHEWABLE_TABLET | ORAL | Status: DC | PRN

## 2014-03-26 MED ORDER — SERTRALINE HCL 100 MG PO TABS
100.0000 mg | ORAL_TABLET | Freq: Every day | ORAL | Status: DC
  Administered 2014-03-26 – 2014-03-27 (×2): 100 mg via ORAL
  Filled 2014-03-26 (×3): qty 1

## 2014-03-26 MED ORDER — OXYTOCIN BOLUS FROM INFUSION: 500.0000 mL | INTRAVENOUS | Status: DC

## 2014-03-26 MED ORDER — ONDANSETRON HCL 4 MG PO TABS: 4.0000 mg | ORAL_TABLET | ORAL | Status: DC | PRN

## 2014-03-26 MED ORDER — SENNOSIDES-DOCUSATE SODIUM 8.6-50 MG PO TABS
2.0000 | ORAL_TABLET | ORAL | Status: DC
  Filled 2014-03-26: qty 2

## 2014-03-26 MED ORDER — IBUPROFEN 100 MG/5ML PO SUSP
600.0000 mg | Freq: Four times a day (QID) | ORAL | Status: DC
  Administered 2014-03-26 – 2014-03-27 (×5): 600 mg via ORAL
  Filled 2014-03-26 (×9): qty 30

## 2014-03-26 MED ORDER — ONDANSETRON HCL 4 MG/2ML IJ SOLN: 4.0000 mg | INTRAMUSCULAR | Status: DC | PRN

## 2014-03-26 MED ORDER — TETANUS-DIPHTH-ACELL PERTUSSIS 5-2.5-18.5 LF-MCG/0.5 IM SUSP: 0.5000 mL | Freq: Once | INTRAMUSCULAR | Status: DC

## 2014-03-26 MED ORDER — LACTATED RINGERS IV SOLN
INTRAVENOUS | Status: DC
  Administered 2014-03-26: 05:00:00 via INTRAVENOUS

## 2014-03-26 MED ORDER — NITROFURANTOIN MONOHYD MACRO 100 MG PO CAPS
100.0000 mg | ORAL_CAPSULE | Freq: Two times a day (BID) | ORAL | Status: DC
  Administered 2014-03-26 – 2014-03-27 (×3): 100 mg via ORAL
  Filled 2014-03-26 (×5): qty 1

## 2014-03-26 MED ORDER — ZOLPIDEM TARTRATE 5 MG PO TABS: 5.0000 mg | ORAL_TABLET | Freq: Every evening | ORAL | Status: DC | PRN

## 2014-03-26 MED ORDER — CITRIC ACID-SODIUM CITRATE 334-500 MG/5ML PO SOLN: 30.0000 mL | ORAL | Status: DC | PRN

## 2014-03-26 MED ORDER — IBUPROFEN 600 MG PO TABS
600.0000 mg | ORAL_TABLET | Freq: Four times a day (QID) | ORAL | Status: DC
  Administered 2014-03-26: 600 mg via ORAL
  Filled 2014-03-26: qty 1

## 2014-03-26 MED ORDER — DIPHENHYDRAMINE HCL 25 MG PO CAPS: 25.0000 mg | ORAL_CAPSULE | Freq: Four times a day (QID) | ORAL | Status: DC | PRN

## 2014-03-26 MED ORDER — DIBUCAINE 1 % RE OINT: 1.0000 "application " | TOPICAL_OINTMENT | RECTAL | Status: DC | PRN

## 2014-03-26 MED ORDER — PRENATAL MULTIVITAMIN CH: 1.0000 | ORAL_TABLET | Freq: Every day | ORAL | Status: DC

## 2014-03-26 MED ORDER — COMPLETENATE 29-1 MG PO CHEW
1.0000 | CHEWABLE_TABLET | Freq: Every day | ORAL | Status: DC
  Administered 2014-03-26 – 2014-03-27 (×2): 1 via ORAL
  Filled 2014-03-26 (×3): qty 1

## 2014-03-26 MED ORDER — LANOLIN HYDROUS EX OINT: TOPICAL_OINTMENT | CUTANEOUS | Status: DC | PRN

## 2014-03-26 MED ORDER — BENZOCAINE-MENTHOL 20-0.5 % EX AERO: 1.0000 "application " | INHALATION_SPRAY | CUTANEOUS | Status: DC | PRN

## 2014-03-26 MED ORDER — ACETAMINOPHEN 325 MG PO TABS: 650.0000 mg | ORAL_TABLET | ORAL | Status: DC | PRN

## 2014-03-26 MED ORDER — LIDOCAINE HCL (PF) 1 % IJ SOLN
INTRAMUSCULAR | Status: AC
  Filled 2014-03-26: qty 30

## 2014-03-26 MED ORDER — POTASSIUM CHLORIDE 20 MEQ/15ML (10%) PO SOLN
20.0000 meq | Freq: Two times a day (BID) | ORAL | Status: DC
  Administered 2014-03-26 (×2): 20 meq via ORAL
  Filled 2014-03-26 (×3): qty 15

## 2014-03-26 NOTE — H&P (Signed)
LABOR ADMISSION HISTORY AND PHYSICAL  [name] is a 34 y.o. female [phone] with IUP at 1659w2d by 5 wk US presenting for contractions. She reports +FMs, No LOF, no blurry vision, headaches or peripheral edema, and RUQ pain.  + vaginal spotting. She plans on bottle and breast feeding. She request Depo for birth control.  Dating: By 5 wk [place] --->  Estimated Date of Delivery: 04/07/14  Sono:    Normal Anatomy Small VSD --> fetal echo with ? 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 GERD (gastroesophageal reflux disease)   . Acid reflux   . Hypertension   . Anemia   . Depression   . Vaginal Pap smear, abnormal   . Alcohol abuse   . H/O suicide attempt     Past Surgical History: Past Surgical History  Procedure Laterality Date  . Leep      Obstetrical History: OB History    Gravida Para Term Preterm AB TAB SAB Ectopic Multiple Living   5 2 2  0 2  2   2       Social History: History   Social History  . Marital Status: Single    Spouse Name: N/A    Number of Children: N/A  . Years of Education: N/A   Social History Main Topics  . Smoking status: Never Smoker   . Smokeless tobacco: Never Used  . Alcohol Use: No     Comment: Hx alcohol abuse  . Drug Use: No     Comment: occasionally  . Sexual Activity: Yes    Birth Control/ Protection: None   Other Topics Concern  . Not on file   Social History Narrative    Family History: Family History  Problem Relation Age of Onset  . Heart disease Mother   . Hyperlipidemia Mother   . Hypertension Mother   . Stroke Mother   . Asthma Sister   . Asthma Son     Allergies: No Known Allergies  Prescriptions prior to admission  Medication Sig Dispense Refill Last Dose  . aspirin 81 MG chewable tablet Chew 81 mg by mouth daily.   Past  Week at Unknown time  . flintstones complete (FLINTSTONES) 60 MG chewable tablet Chew 2 tablets by mouth daily.   03/25/2014 at Unknown time  . nitrofurantoin, macrocrystal-monohydrate, (MACROBID) 100 MG capsule Take 1 capsule (100 mg total) by mouth 2 (two) times daily. 20 capsule 0 03/25/2014 at Unknown time  . ondansetron (ZOFRAN ODT) 4 MG disintegrating tablet Take 1-2 tablets (4-8 mg total) by mouth every 8 (eight) hours as needed for nausea or vomiting. 20 tablet 0 Past Week at Unknown time  . potassium chloride (KLOR-CON) 20 MEQ packet Take 20 mEq by mouth 2 (two) times daily.   Past Week at Unknown time  . promethazine (PHENERGAN) 25 MG tablet Take 1 tablet (25 mg total) by mouth every 6 (six) hours as needed for nausea or vomiting. 30 tablet 5 Past Week at Unknown time  . ranitidine (ZANTAC) 150 MG tablet Take 150 mg by mouth 2 (two) times daily.    Past Week at Unknown time  . scopolamine (TRANSDERM-SCOP) 1 MG/3DAYS Place 1 patch onto the skin every 3 (three) days.   Past Month at Unknown time  . sertraline (ZOLOFT) 100 MG tablet Take  1 tablet (100 mg total) by mouth daily. 30 tablet 3 Past Week at Unknown time     Review of Systems   All systems reviewed and negative except as stated in HPI  Blood pressure 127/81, pulse 102, temperature 98.3 F (36.8 C), last menstrual period 07/01/2013. General appearance: alert, cooperative and appears stated age Lungs: clear to auscultation bilaterally Heart: regular rate and rhythm Abdomen: soft, non-tender; bowel sounds normal Pelvic: Adequate Extremities: Homans sign is negative, no sign of DVT Presentation: cephalic Fetal monitoringBaseline: 150 bpm, Variability: Good {> 6 bpm), Accelerations: Reactive and Decelerations: Absent Uterine activityDate/time of onset: 1/19, Frequency: Every 3 minutes, Duration: 30 seconds and Intensity: moderate Dilation: 8 Effacement (%): 90 Station: -1 Exam by:: Duncan Dull   Prenatal labs: ABO, Rh:  --/--/A POS, A POS (01/04 0140) Antibody: NEG (01/04 0140) Rubella:   RPR: NON REAC (12/02 0933)  HBsAg: Negative (08/03 0000)  HIV: NONREACTIVE (12/02 0933)  GBS:    1 hr Glucola 102 Genetic screening  neg Anatomy US  Small VSD --> needs fetal echo outpt   No results found for this or any previous visit (from the past 24 hour(s)).  Patient Active Problem List   Diagnosis Date Noted  . Active labor at term 03/26/2014  . Pregnancy with poor reproductive history   . Pre-existing essential hypertension complicating pregnancy   . Heart abnormality   . History of preterm delivery   . 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 Nausea and vomiting of pregnancy, antepartum 12/19/2013  . Marginal placenta previa on 17 week scan 11/01/2013  . Gastroesophageal reflux during pregnancy, antepartum 10/25/2013  . Depression affecting pregnancy, antepartum 10/25/2013  . Previous preterm delivery, antepartum 10/23/2013  . History of preeclampsia, prior pregnancy, currently pregnant 10/23/2013  . Chronic hypertension complicating pregnancy, antepartum 10/23/2013  . History of suicide attempt 10/23/2013  . Substance use disorder 10/23/2013  . History of depression 10/23/2013  . Abnormal Pap smear of cervix 10/23/2013  . Hypokalemia 08/08/2013  . Hyperemesis complicating pregnancy, antepartum 08/08/2013  . Essential hypertension, benign 08/08/2013  . 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 History of alcohol use 08/08/2013  . H/O alcoholic gastritis 08/08/2013    Assessment: [name] is a 34 y.o. [phone] at [redacted]w[redacted]d here for term labor  #Labor: AROM given imminent delivery #Pain: Nothing given imminent delivery  #FWB: Cat I reassuring #ID: GBS unknown, Will not treat given minimal risk factors #MOF: Breast + bottle #MOC: Depo #Circ:  declines  Elita Boone 03/26/2014, 5:10 AM

## 2014-03-26 NOTE — Progress Notes (Signed)
Was called to see patient about a Lt hand lesion. Patient states that lesion initially presented about 1 week ago after a visit to the MAU. She states that she received an injection into an IV that was placed in that hand. She is unsure whether the injection was for pain or nausea. According to the patient the nurse was told by another nurse to "make sure to push it slowly". The patient eluded to the fact that the nurse did not push it slowly. She states she complained of it burning while the medication was pushed, ask "are you pushing it slowly", and the nurse respond "yes, as slowly as i can". After that episode she noticed a lesion on the dorsal surface of her Lt hand. She states this lesion was quite large and swelling of the hand was present. Patient did not complain of any functional deficits in that hand until prompted. She states that when the lesion was much more swollen she had some numbness in her fingers. She also reports she could not close her hand completely. She says that these sxs are resolved with the exception of the tenderness w/ full ROM. Patient reports some "puss" discharge from lesion. I have asked her   Patient denies and recent fever, chills, diaphoresis, or weakness.  General -- oriented x3, pleasant and cooperative. Integument -- intact. No rash, erythema, or ecchymoses.  Chest -- good expansion.  Extremeties - ~2x3cm soft tissue lesion over the dorsal surface of the 4-5th metacarpals. Some skin darkening over this lesion. Some minimal swelling at the distal knuckles of digit 3-5. Lesion itself is firm and tender. Tenderness w/ full finger extension and mild tenderness w/ full finger flexion. TTP at dorsal wrist, medial to the distal ulna. Tenderness w/ wrist flexion (P&A). Minimal/no tenderness w/ wrist extension (P&A). ROM full throughout. 5/5 strength. Pulse present. No erythema or warmth appreciated at lesion site or proximally.  A/P: - soft tissue lesion; suspicious for  infection. No signs of systemic spread. Likely 2/2 peripheral IV placement. According to patient, lesion is improving on it's own. No erythema/warmth at lesion site. I have asked patient to call the nurse to see discharge if it occurs again (nurse informed of this).         - Conservative management at this time; deemed appropriate due to the significant improvement reported by patient.        - I will f/u w/ her in the AM        - Low threshold for US of lesion; if abscess appreciated then would lance lesion.         - No antibiotics at this time; would consider dicloxacillin or keflex if necessary.

## 2014-03-26 NOTE — Lactation Note (Signed)
This note was copied from the chart of [name]. Lactation Consultation Note  Initial visit done.  Mom states baby has latched on but she prefers to both breast and formula feed.  Instructed to put the baby to breast first with any feeding cue.  Encouraged to call with concerns/assist prn.  Breastfeeding consultation services and support information given to patient.  Patient Name: [name] [id number]'s Date: 03/26/2014 Reason for consult: Initial assessment   Maternal Data Does the patient have breastfeeding experience prior to this delivery?: Yes  Feeding Feeding Type: Breast Fed Nipple Type: Regular  LATCH Score/Interventions Latch: Grasps breast easily, tongue down, lips flanged, rhythmical sucking.  Audible Swallowing: None Intervention(s): Hand expression;Skin to skin  Type of Nipple: Everted at rest and after stimulation  Comfort (Breast/Nipple): Soft / non-tender     Hold (Positioning): Assistance needed to correctly position infant at breast and maintain latch. Intervention(s): Breastfeeding basics reviewed  LATCH Score: 7  Lactation Tools Discussed/Used     Consult Status Consult Status: Follow-up Date: 03/27/14 Follow-up type: In-patient    Huston FoleyMOULDEN, Brytnee Bechler S 03/26/2014, 1:50 PM

## 2014-03-26 NOTE — Progress Notes (Signed)
Dr Rachelle HoraMoss notified that pt's L hand has some swelling with bruising and ?skin pustules.  Provider will see pt this pm

## 2014-03-26 NOTE — MAU Note (Signed)
Arrival via EMS with complaint of worsening contractions and ?ROM. Some bloody show noted on perineum. SVE 8 w/ a bulging bag. Call to Dr Su Hiltoberts and call for a bed on L/D. To 162 per stretcher.

## 2014-03-27 LAB — CBC
HEMATOCRIT: 25.1 % — AB (ref 36.0–46.0)
Hemoglobin: 8.5 g/dL — ABNORMAL LOW (ref 12.0–15.0)
MCH: 29.7 pg (ref 26.0–34.0)
MCHC: 33.9 g/dL (ref 30.0–36.0)
MCV: 87.8 fL (ref 78.0–100.0)
Platelets: 234 10*3/uL (ref 150–400)
RBC: 2.86 MIL/uL — ABNORMAL LOW (ref 3.87–5.11)
RDW: 13.8 % (ref 11.5–15.5)
WBC: 11.9 10*3/uL — AB (ref 4.0–10.5)

## 2014-03-27 LAB — HIV ANTIBODY (ROUTINE TESTING W REFLEX)
HIV 1/O/2 Abs-Index Value: <1 (ref <1.00)
HIV-1/HIV-2 Ab: NONREACTIVE

## 2014-03-27 LAB — RPR: RPR: NONREACTIVE

## 2014-03-27 MED ORDER — DOXYCYCLINE HYCLATE 100 MG PO TABS
100.0000 mg | ORAL_TABLET | Freq: Two times a day (BID) | ORAL | Status: DC
  Administered 2014-03-27: 100 mg via ORAL
  Filled 2014-03-27 (×3): qty 1

## 2014-03-27 MED ORDER — POTASSIUM CHLORIDE CRYS ER 20 MEQ PO TBCR
20.0000 meq | EXTENDED_RELEASE_TABLET | Freq: Two times a day (BID) | ORAL | Status: DC
  Administered 2014-03-27: 20 meq via ORAL
  Filled 2014-03-27 (×3): qty 1

## 2014-03-27 MED ORDER — DOXYCYCLINE HYCLATE 100 MG PO TABS: 100.0000 mg | ORAL_TABLET | Freq: Two times a day (BID) | ORAL | Status: DC

## 2014-03-27 NOTE — Lactation Note (Signed)
This note was copied from the chart of Girl Anola GurneyShakeema Nevel. Lactation Consultation Note  Patient Name: Girl Anola GurneyShakeema Groom ZOXWR'UToday's Date: 03/27/2014 Reason for consult: Follow-up assessment Mom UDS and Baby UDS positive for THC. Discussed with Peds and it is not recommended for Mom to breastfeed. Mom has history of Marijuana use during pregnancy with 2 older children as well. Mom reports that she used Marijuana due to hyperemesis with pregnancy. Mom reports she does not plan to continue using while breastfeeding. Mom has hand out regarding risk of marijuana use with pregnancy. LC advised that if Mom plans to breastfeed she needs to pump and dump her milk till she has negative UDS and this will take as long as 30-45 days. This would require her to pump every 3 hours for 15 minutes to maintain milk supply. LC advised Mom due to stressors at home formula/bottle feeding would be a better option for her especially if she uses marijuana as a coping mechanism. Mom was pleasant during the conversation and reports she understands the council.   Maternal Data    Feeding Feeding Type: Bottle Fed - Formula  LATCH Score/Interventions                      Lactation Tools Discussed/Used     Consult Status Consult Status: Complete Date: 03/27/14 Follow-up type: In-patient    Alfred LevinsGranger, Armella Stogner Ann 03/27/2014, 1:44 PM

## 2014-03-27 NOTE — Progress Notes (Signed)
Dime sized lesion noted to left hand. Raised and firm to the touch. Small amount of clear discharge noted. Pt states still painful to the touch. Not hot to the touch and has not worsened overnight.

## 2014-03-27 NOTE — Progress Notes (Addendum)
CSW spoke with Pryor MontesJosh Canon, assigned CPS worker.  CPS reported that the MOB's phone number listed on the report is currently not working, and CSW informed CPS that the MOB has reported that her battery is dead on her phone.  CPS reported desire to meet with the MOB at the hospital prior to discharge, and discussed plans to come to the hospital within the hour.  CSW provided CPS worker with the MOB's hospital room number and requested that CPS notify CSW outcome of assessment.  CPS agreed.  CSW notified [facility] of CPS request, and requested that the baby not be discharged until CPS visit completed.   CSW to continue to follow.   Update at 3:15pm:  Per CPS, no barriers to discharge.  CPS reported intention to complete home visit on 1/21 at 8:00am and discussed intentions to ensure appropriate referrals to mental health.  CSW notified [facility] and [facility] that there are no barriers to discharge.

## 2014-03-27 NOTE — Progress Notes (Signed)
Post Partum Day 1 Subjective: up ad lib, voiding, tolerating PO, + flatus and continues to have some pain in Lt hand lesion. Reports adequate pain control for vaginal delivery pain. Regarding the hand lesion: No changes overnight. No reports of purulent discharge from lesion. TTP. Full ROM and functality. Sensation intact.  Objective: Blood pressure 123/89, pulse 83, temperature 98.4 F (36.9 C), temperature source Oral, resp. rate 20, height 5\' 3"  (1.6 m), weight 90.719 kg (200 lb), last menstrual period 07/01/2013, unknown if currently breastfeeding.  Physical Exam:  General: alert, cooperative, appears stated age and no distress Lochia: appropriate Uterine Fundus: firm Incision: n/a DVT Evaluation: No evidence of DVT seen on physical exam. Extremities: No changes from exam last night. No discharge appreciated. ROM full, Strength 5/5. TTP. No erythema present. Some swelling continues to be present on dorsal surface of Lt hand.    Recent Labs  03/26/14 0510 03/27/14 0550  HGB 9.8* 8.5*  HCT 29.1* 25.1*    Assessment/Plan: Plan for discharge tomorrow and Contraception originally reported wanting the Depo shot; now would like to take the pill.  Breast and bottle feeding Would like to wait as long as possible until baby's tests are performed. If they are done today she is ok w/ DC later today. Considering antibiotic for skin lesion.   LOS: 1 day   Kathee DeltonMcKeag, Ian D 03/27/2014, 7:56 AM

## 2014-03-27 NOTE — Progress Notes (Signed)
Ur chart review completed.  

## 2014-03-27 NOTE — Progress Notes (Addendum)
Clinical Social Work Department PSYCHOSOCIAL ASSESSMENT - MATERNAL/CHILD 03/27/2014  Patient:  [name], [name]  Account Number:  [id number]  Admit Date:  03/26/2014  Ardine Eng Name:   Ellan Lambert   Clinical Social Worker:  Lucita Ferrara, CLINICAL SOCIAL WORKER   Date/Time:  03/27/2014 09:15 AM  Date Referred:  03/26/2014   Referral source  [facility]     Referred reason  Depression/Anxiety  Substance Abuse   Other referral source:    I:  FAMILY / HOME ENVIRONMENT Child's legal guardian:  PARENT  Guardian - Name Guardian - Age Guardian - [place] [address] [place], [place] [address]  Azucena Kuba  different residence, lives in [place]   Other household support members/support persons Name Relationship DOB  Aida Puffer 2007   Other support:   MOB reported that she has a close relationship with her mother (who also lives in [place])   [place]:  Patient Interview  Insurance risk surveyor Resources Employment:   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   Financial resources:  Medicaid If Medicaid - County:  [place] Other  Section [place] / Grade:  N/A Music therapist / Child Services Coordination / Early Interventions:   None reported  Cultural issues impacting care:   None reported   III  STRENGTHS Strengths  Adequate Resources  Home prepared for Child (including basic supplies)  Supportive family/friends   Strength comment:  MOB reported that she has reached out to her daugther's school social worker which has helped her be referred to [employer] (to secure furniture for her home)  IV  RISK FACTORS AND CURRENT PROBLEMS Current Problem:  YES   Risk Factor & Current Problem Patient Issue Family Issue Risk Factor / Current Problem Comment  Mental Illness Y N MOB presents with history of  depression.  She is currently prescribed Zoloft and reports belief that her symptoms are controlled.  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  The baby's UDS is positive for THC, MDS is pending.    V  SOCIAL WORK ASSESSMENT CSW met with the MOB due to substance use and mental health history.  MOB is known to CSW as CSW met with the MOB in [facility] in October due to frequent visits to [facility] for hyperemesis and concern about potential secondary gain for visits to the hospital.  MOB remembered CSW and was quick to re-engage with CSW.  In October, MOB appeared depressed and was difficult to engage; however, MOB appeared to be in a more pleasant mood today.  She displayed a full range in affect, smiled frequently, and openly discussed with CSW her past/current psychosocial stressors.  She did not present with any acute mental health symptoms. The baby was not in the room upon arrival to the room due to receiving echocardiogram; however, once the baby returned, the MOB was quick to interact and smile with the baby.  The MOB had her 3rd grade daughter in the room, but CSW was able to inquire about her substance use when her daughter briefly left the room.  MOB smiled as she reflected upon the numerous changes in her life since the CSW last saw her.  Per MOB, since October, she has secured housing assistance which has allowed her to move out of her mother's home and into  her own apartment with her daughter.  She reflected upon her persistence to receive housing assistance, and shared belief that it was due to her frequent calls to housing, that she was able to receiving assistance more quickly.  She stated that she has been in contact with the social worker at her daughter's school which has allowed her to participate in the [employer] and ensure that they have appropriate/adequate furniture in the home.  CSW inquired about status of custody hearings since MOB was notably stressed in October  secondary to custody battles.  The MOB stated that she continues to have ongoing stress secondary to stress since she still has limited contact with her son; however, she expressed hope that the issues will soon be resolved since she has been told by the judge that the father of her son is currently in violation of the custody agreement.  The MOB shared that since October, her daughter has moved in with her, and she expressed gratitude for this change.  The MOB was guided to reflect upon the numerous changes that have occurred since October (securing own housing, consistent employment, and her daughter moving back into her home), and she shared that she is proud of her accomplishments. The MOB stated that it continues to feel surreal since a year ago her life appeared drastically different (had recently moved to [place], was living with her mother, and was unemployed).  She continued to express motivation to reach her self-identified goals for herself and her children.   MOB denied current concerns secondary to her mental health.  MOB confirmed that she has been taking Zoloft as prescribed (was prescribed shortly prior to CSW intervention with MOB in October), and discussed intention to continue medication in the postpartum period.  She denied depressive symptoms and acknowledged steady improvement of symptoms.  MOB acknowledged increased risk for developing postpartum depression given her mental health history and her recent psychosocial stressors. She reported intention to follow up with her MD if she notes symptoms and she also expressed interest in mental health referrals for therapy.  She stated that it has been difficult for her to go to therapy during the pregnancy since she was focused on her physical health and ensuring that basic needs were met.   MOB acknowledged THC use during the pregnancy.  She reported that she was smoking a "puff here and there" throughout the pregnancy due to intense nausea.   MOB reported last use 3 weeks ago.  She voiced understanding of hospital drug screen policy and stated that she has already been notified that the baby's UDS is positive for THC.  MOB appeared frustrated when CSW notified her of CPS report since she stated that she had been informed that there would no involvement with CPS if it was "only marijuana".  CSW provided education on need to make CPS report anytime there is a positive toxicology screen if there is no prescription to warrant positive results.  MOB denied additional questions or concerns, and shared that she CPS became involved after "my others were born since I smoked marijuana then too".    No barriers to discharge.   [place] Social Work Plan  Information/Referral to [employer]  Patient/Family Education  Child Protective Services Report  No Further Intervention Required / No Barriers to Discharge   Type of pt/family education:   Postpartum depression  Hospital drug screen policy   If child protective services report - county:  [place] If child protective services  report - date:  03/27/2014 Information/referral to community resources comment:   CSW provided MOB with mental health referrals for herself and her daughter (daughter recently diagnosed with ADHD), per MOB's request.   Other social work plan:   CSW to follow up PRN.

## 2014-03-27 NOTE — Progress Notes (Signed)
Dr Wende MottMcKeag notified of lesion on pt's L hand.

## 2014-03-28 NOTE — Progress Notes (Signed)
Post discharge ur review completed. 

## 2014-04-18 ENCOUNTER — Telehealth: Payer: Self-pay

## 2014-04-18 DIAGNOSIS — F32A Depression, unspecified: Secondary | ICD-10-CM

## 2014-04-18 DIAGNOSIS — O99340 Other mental disorders complicating pregnancy, unspecified trimester: Principal | ICD-10-CM

## 2014-04-18 DIAGNOSIS — F329 Major depressive disorder, single episode, unspecified: Secondary | ICD-10-CM

## 2014-04-18 NOTE — Telephone Encounter (Signed)
Patient called stating she needs refill of her depression medicine-- states she has been out of it for 3 weeks and really needs it. Per chart review, delivered 03/26/14 and has PP visit 05/02/14. Called patient who states she is trying to get refill of Zoloft. Informed patient there were no providers left in the office but that I would speak to one tomorrow morning regarding the refill and call her with a response. Patient verbalized understanding and gratitude. No further questions or concerns.

## 2014-04-19 MED ORDER — SERTRALINE HCL 100 MG PO TABS: 100.0000 mg | ORAL_TABLET | Freq: Every day | ORAL | Status: DC

## 2014-04-19 NOTE — Telephone Encounter (Signed)
Dr. Debroah LoopArnold approved Zoloft refill. Medication e-prescribed to patient's preferred pharmacy. Called patient's mobile phone-- no answer. Left message stating your medication has been refilled, call clinic with any questions or concerns. Attemtped home phone-- woman answered and stated that [name] does not live there-- did not leave message.

## 2014-05-02 ENCOUNTER — Ambulatory Visit: Payer: Medicaid Other | Admitting: Advanced Practice Midwife

## 2014-05-07 NOTE — Discharge Summary (Signed)
Obstetric Discharge Summary Reason for Admission: onset of labor Prenatal Procedures: none Intrapartum Procedures: spontaneous vaginal delivery Postpartum Procedures: none Complications-Operative and Postpartum: none HEMOGLOBIN  Date Value Ref Range Status  03/27/2014 8.5* 12.0 - 15.0 g/dL Final  21/30/865708/05/2013 9.7 g/dL Final   HCT  Date Value Ref Range Status  03/27/2014 25.1* 36.0 - 46.0 % Final  10/08/2013 30 % Final    Physical Exam:  General: alert, cooperative and no distress Lochia: appropriate Uterine Fundus: firm DVT Evaluation: No evidence of DVT seen on physical exam. Negative Homan's sign. No cords or calf tenderness.  Discharge Diagnoses: Term Pregnancy-delivered  Discharge Information: Date: 05/07/2014 Activity: pelvic rest Diet: routine Medications: PNV and Ibuprofen Condition: stable Instructions: refer to practice specific booklet Discharge to: home   Newborn Data: Live born female  Birth Weight: 6 lb 5.6 oz (2880 g) APGAR: 9, 9  Home with mother.  [name] JEHIEL 05/07/2014, 12:34 PM

## 2014-05-22 ENCOUNTER — Ambulatory Visit (INDEPENDENT_AMBULATORY_CARE_PROVIDER_SITE_OTHER): Payer: Self-pay | Admitting: Obstetrics & Gynecology

## 2014-05-22 ENCOUNTER — Encounter: Payer: Self-pay | Admitting: Obstetrics & Gynecology

## 2014-05-22 DIAGNOSIS — Z3009 Encounter for other general counseling and advice on contraception: Secondary | ICD-10-CM

## 2014-05-22 DIAGNOSIS — Z309 Encounter for contraceptive management, unspecified: Secondary | ICD-10-CM

## 2014-05-22 MED ORDER — NORGESTIMATE-ETH ESTRADIOL 0.25-35 MG-MCG PO TABS: 1.0000 | ORAL_TABLET | Freq: Every day | ORAL | Status: DC

## 2014-05-22 NOTE — Patient Instructions (Signed)
Preventive Care for Adults A healthy lifestyle and preventive care can promote health and wellness. Preventive health guidelines for women include the following key practices.  A routine yearly physical is a good way to check with your health care provider about your health and preventive screening. It is a chance to share any concerns and updates on your health and to receive a thorough exam.  Visit your dentist for a routine exam and preventive care every 6 months. Brush your teeth twice a day and floss once a day. Good oral hygiene prevents tooth decay and gum disease.  The frequency of eye exams is based on your age, health, family medical history, use of contact lenses, and other factors. Follow your health care provider's recommendations for frequency of eye exams.  Eat a healthy diet. Foods like vegetables, fruits, whole grains, low-fat dairy products, and lean protein foods contain the nutrients you need without too many calories. Decrease your intake of foods high in solid fats, added sugars, and salt. Eat the right amount of calories for you.Get information about a proper diet from your health care provider, if necessary.  Regular physical exercise is one of the most important things you can do for your health. Most adults should get at least 150 minutes of moderate-intensity exercise (any activity that increases your heart rate and causes you to sweat) each week. In addition, most adults need muscle-strengthening exercises on 2 or more days a week.  Maintain a healthy weight. The body mass index (BMI) is a screening tool to identify possible weight problems. It provides an estimate of body fat based on height and weight. Your health care provider can find your BMI and can help you achieve or maintain a healthy weight.For adults 20 years and older:  A BMI below 18.5 is considered underweight.  A BMI of 18.5 to 24.9 is normal.  A BMI of 25 to 29.9 is considered overweight.  A BMI of  30 and above is considered obese.  Maintain normal blood lipids and cholesterol levels by exercising and minimizing your intake of saturated fat. Eat a balanced diet with plenty of fruit and vegetables. Blood tests for lipids and cholesterol should begin at age 20 and be repeated every 5 years. If your lipid or cholesterol levels are high, you are over 50, or you are at high risk for heart disease, you may need your cholesterol levels checked more frequently.Ongoing high lipid and cholesterol levels should be treated with medicines if diet and exercise are not working.  If you smoke, find out from your health care provider how to quit. If you do not use tobacco, do not start.  Lung cancer screening is recommended for adults aged 55-80 years who are at high risk for developing lung cancer because of a history of smoking. A yearly low-dose CT scan of the lungs is recommended for people who have at least a 30-pack-year history of smoking and are a current smoker or have quit within the past 15 years. A pack year of smoking is smoking an average of 1 pack of cigarettes a day for 1 year (for example: 1 pack a day for 30 years or 2 packs a day for 15 years). Yearly screening should continue until the smoker has stopped smoking for at least 15 years. Yearly screening should be stopped for people who develop a health problem that would prevent them from having lung cancer treatment.  If you are pregnant, do not drink alcohol. If you are breastfeeding,   be very cautious about drinking alcohol. If you are not pregnant and choose to drink alcohol, do not have more than 1 drink per day. One drink is considered to be 12 ounces (355 mL) of beer, 5 ounces (148 mL) of wine, or 1.5 ounces (44 mL) of liquor.  Avoid use of street drugs. Do not share needles with anyone. Ask for help if you need support or instructions about stopping the use of drugs.  High blood pressure causes heart disease and increases the risk of  stroke. Your blood pressure should be checked at least every 1 to 2 years. Ongoing high blood pressure should be treated with medicines if weight loss and exercise do not work.  If you are 75-52 years old, ask your health care provider if you should take aspirin to prevent strokes.  Diabetes screening involves taking a blood sample to check your fasting blood sugar level. This should be done once every 3 years, after age 15, if you are within normal weight and without risk factors for diabetes. Testing should be considered at a younger age or be carried out more frequently if you are overweight and have at least 1 risk factor for diabetes.  Breast cancer screening is essential preventive care for women. You should practice "breast self-awareness." This means understanding the normal appearance and feel of your breasts and may include breast self-examination. Any changes detected, no matter how small, should be reported to a health care provider. Women in their 58s and 30s should have a clinical breast exam (CBE) by a health care provider as part of a regular health exam every 1 to 3 years. After age 16, women should have a CBE every year. Starting at age 53, women should consider having a mammogram (breast X-ray test) every year. Women who have a family history of breast cancer should talk to their health care provider about genetic screening. Women at a high risk of breast cancer should talk to their health care providers about having an MRI and a mammogram every year.  Breast cancer gene (BRCA)-related cancer risk assessment is recommended for women who have family members with BRCA-related cancers. BRCA-related cancers include breast, ovarian, tubal, and peritoneal cancers. Having family members with these cancers may be associated with an increased risk for harmful changes (mutations) in the breast cancer genes BRCA1 and BRCA2. Results of the assessment will determine the need for genetic counseling and  BRCA1 and BRCA2 testing.  Routine pelvic exams to screen for cancer are no longer recommended for nonpregnant women who are considered low risk for cancer of the pelvic organs (ovaries, uterus, and vagina) and who do not have symptoms. Ask your health care provider if a screening pelvic exam is right for you.  If you have had past treatment for cervical cancer or a condition that could lead to cancer, you need Pap tests and screening for cancer for at least 20 years after your treatment. If Pap tests have been discontinued, your risk factors (such as having a new sexual partner) need to be reassessed to determine if screening should be resumed. Some women have medical problems that increase the chance of getting cervical cancer. In these cases, your health care provider may recommend more frequent screening and Pap tests.  The HPV test is an additional test that may be used for cervical cancer screening. The HPV test looks for the virus that can cause the cell changes on the cervix. The cells collected during the Pap test can be  tested for HPV. The HPV test could be used to screen women aged 30 years and older, and should be used in women of any age who have unclear Pap test results. After the age of 30, women should have HPV testing at the same frequency as a Pap test.  Colorectal cancer can be detected and often prevented. Most routine colorectal cancer screening begins at the age of 50 years and continues through age 75 years. However, your health care provider may recommend screening at an earlier age if you have risk factors for colon cancer. On a yearly basis, your health care provider may provide home test kits to check for hidden blood in the stool. Use of a small camera at the end of a tube, to directly examine the colon (sigmoidoscopy or colonoscopy), can detect the earliest forms of colorectal cancer. Talk to your health care provider about this at age 50, when routine screening begins. Direct  exam of the colon should be repeated every 5-10 years through age 75 years, unless early forms of pre-cancerous polyps or small growths are found.  People who are at an increased risk for hepatitis B should be screened for this virus. You are considered at high risk for hepatitis B if:  You were born in a country where hepatitis B occurs often. Talk with your health care provider about which countries are considered high risk.  Your parents were born in a high-risk country and you have not received a shot to protect against hepatitis B (hepatitis B vaccine).  You have HIV or AIDS.  You use needles to inject street drugs.  You live with, or have sex with, someone who has hepatitis B.  You get hemodialysis treatment.  You take certain medicines for conditions like cancer, organ transplantation, and autoimmune conditions.  Hepatitis C blood testing is recommended for all people born from 1945 through 1965 and any individual with known risks for hepatitis C.  Practice safe sex. Use condoms and avoid high-risk sexual practices to reduce the spread of sexually transmitted infections (STIs). STIs include gonorrhea, chlamydia, syphilis, trichomonas, herpes, HPV, and human immunodeficiency virus (HIV). Herpes, HIV, and HPV are viral illnesses that have no cure. They can result in disability, cancer, and death.  You should be screened for sexually transmitted illnesses (STIs) including gonorrhea and chlamydia if:  You are sexually active and are younger than 24 years.  You are older than 24 years and your health care provider tells you that you are at risk for this type of infection.  Your sexual activity has changed since you were last screened and you are at an increased risk for chlamydia or gonorrhea. Ask your health care provider if you are at risk.  If you are at risk of being infected with HIV, it is recommended that you take a prescription medicine daily to prevent HIV infection. This is  called preexposure prophylaxis (PrEP). You are considered at risk if:  You are a heterosexual woman, are sexually active, and are at increased risk for HIV infection.  You take drugs by injection.  You are sexually active with a partner who has HIV.  Talk with your health care provider about whether you are at high risk of being infected with HIV. If you choose to begin PrEP, you should first be tested for HIV. You should then be tested every 3 months for as long as you are taking PrEP.  Osteoporosis is a disease in which the bones lose minerals and strength   with aging. This can result in serious bone fractures or breaks. The risk of osteoporosis can be identified using a bone density scan. Women ages 65 years and over and women at risk for fractures or osteoporosis should discuss screening with their health care providers. Ask your health care provider whether you should take a calcium supplement or vitamin D to reduce the rate of osteoporosis.  Menopause can be associated with physical symptoms and risks. Hormone replacement therapy is available to decrease symptoms and risks. You should talk to your health care provider about whether hormone replacement therapy is right for you.  Use sunscreen. Apply sunscreen liberally and repeatedly throughout the day. You should seek shade when your shadow is shorter than you. Protect yourself by wearing long sleeves, pants, a wide-brimmed hat, and sunglasses year round, whenever you are outdoors.  Once a month, do a whole body skin exam, using a mirror to look at the skin on your back. Tell your health care provider of new moles, moles that have irregular borders, moles that are larger than a pencil eraser, or moles that have changed in shape or color.  Stay current with required vaccines (immunizations).  Influenza vaccine. All adults should be immunized every year.  Tetanus, diphtheria, and acellular pertussis (Td, Tdap) vaccine. Pregnant women should  receive 1 dose of Tdap vaccine during each pregnancy. The dose should be obtained regardless of the length of time since the last dose. Immunization is preferred during the 27th-36th week of gestation. An adult who has not previously received Tdap or who does not know her vaccine status should receive 1 dose of Tdap. This initial dose should be followed by tetanus and diphtheria toxoids (Td) booster doses every 10 years. Adults with an unknown or incomplete history of completing a 3-dose immunization series with Td-containing vaccines should begin or complete a primary immunization series including a Tdap dose. Adults should receive a Td booster every 10 years.  Varicella vaccine. An adult without evidence of immunity to varicella should receive 2 doses or a second dose if she has previously received 1 dose. Pregnant females who do not have evidence of immunity should receive the first dose after pregnancy. This first dose should be obtained before leaving the health care facility. The second dose should be obtained 4-8 weeks after the first dose.  Human papillomavirus (HPV) vaccine. Females aged 13-26 years who have not received the vaccine previously should obtain the 3-dose series. The vaccine is not recommended for use in pregnant females. However, pregnancy testing is not needed before receiving a dose. If a female is found to be pregnant after receiving a dose, no treatment is needed. In that case, the remaining doses should be delayed until after the pregnancy. Immunization is recommended for any person with an immunocompromised condition through the age of 26 years if she did not get any or all doses earlier. During the 3-dose series, the second dose should be obtained 4-8 weeks after the first dose. The third dose should be obtained 24 weeks after the first dose and 16 weeks after the second dose.  Zoster vaccine. One dose is recommended for adults aged 60 years or older unless certain conditions are  present.  Measles, mumps, and rubella (MMR) vaccine. Adults born before 1957 generally are considered immune to measles and mumps. Adults born in 1957 or later should have 1 or more doses of MMR vaccine unless there is a contraindication to the vaccine or there is laboratory evidence of immunity to   each of the three diseases. A routine second dose of MMR vaccine should be obtained at least 28 days after the first dose for students attending postsecondary schools, health care workers, or international travelers. People who received inactivated measles vaccine or an unknown type of measles vaccine during 1963-1967 should receive 2 doses of MMR vaccine. People who received inactivated mumps vaccine or an unknown type of mumps vaccine before 1979 and are at high risk for mumps infection should consider immunization with 2 doses of MMR vaccine. For females of childbearing age, rubella immunity should be determined. If there is no evidence of immunity, females who are not pregnant should be vaccinated. If there is no evidence of immunity, females who are pregnant should delay immunization until after pregnancy. Unvaccinated health care workers born before 1957 who lack laboratory evidence of measles, mumps, or rubella immunity or laboratory confirmation of disease should consider measles and mumps immunization with 2 doses of MMR vaccine or rubella immunization with 1 dose of MMR vaccine.  Pneumococcal 13-valent conjugate (PCV13) vaccine. When indicated, a person who is uncertain of her immunization history and has no record of immunization should receive the PCV13 vaccine. An adult aged 19 years or older who has certain medical conditions and has not been previously immunized should receive 1 dose of PCV13 vaccine. This PCV13 should be followed with a dose of pneumococcal polysaccharide (PPSV23) vaccine. The PPSV23 vaccine dose should be obtained at least 8 weeks after the dose of PCV13 vaccine. An adult aged 19  years or older who has certain medical conditions and previously received 1 or more doses of PPSV23 vaccine should receive 1 dose of PCV13. The PCV13 vaccine dose should be obtained 1 or more years after the last PPSV23 vaccine dose.  Pneumococcal polysaccharide (PPSV23) vaccine. When PCV13 is also indicated, PCV13 should be obtained first. All adults aged 65 years and older should be immunized. An adult younger than age 65 years who has certain medical conditions should be immunized. Any person who resides in a nursing home or long-term care facility should be immunized. An adult smoker should be immunized. People with an immunocompromised condition and certain other conditions should receive both PCV13 and PPSV23 vaccines. People with human immunodeficiency virus (HIV) infection should be immunized as soon as possible after diagnosis. Immunization during chemotherapy or radiation therapy should be avoided. Routine use of PPSV23 vaccine is not recommended for American Indians, [place] Natives, or people younger than 65 years unless there are medical conditions that require PPSV23 vaccine. When indicated, people who have unknown immunization and have no record of immunization should receive PPSV23 vaccine. One-time revaccination 5 years after the first dose of PPSV23 is recommended for people aged 19-64 years who have chronic kidney failure, nephrotic syndrome, asplenia, or immunocompromised conditions. People who received 1-2 doses of PPSV23 before age 65 years should receive another dose of PPSV23 vaccine at age 65 years or later if at least 5 years have passed since the previous dose. Doses of PPSV23 are not needed for people immunized with PPSV23 at or after age 65 years.  Meningococcal vaccine. Adults with asplenia or persistent complement component deficiencies should receive 2 doses of quadrivalent meningococcal conjugate (MenACWY-D) vaccine. The doses should be obtained at least 2 months apart.  Microbiologists working with certain meningococcal bacteria, military recruits, people at risk during an outbreak, and people who travel to or live in countries with a high rate of meningitis should be immunized. A first-year college student up through age   21 years who is living in a residence hall should receive a dose if she did not receive a dose on or after her 16th birthday. Adults who have certain high-risk conditions should receive one or more doses of vaccine.  Hepatitis A vaccine. Adults who wish to be protected from this disease, have certain high-risk conditions, work with hepatitis A-infected animals, work in hepatitis A research labs, or travel to or work in countries with a high rate of hepatitis A should be immunized. Adults who were previously unvaccinated and who anticipate close contact with an international adoptee during the first 60 days after arrival in the [place] from a country with a high rate of hepatitis A should be immunized.  Hepatitis B vaccine. Adults who wish to be protected from this disease, have certain high-risk conditions, may be exposed to blood or other infectious body fluids, are household contacts or sex partners of hepatitis B positive people, are clients or workers in certain care facilities, or travel to or work in countries with a high rate of hepatitis B should be immunized.  Haemophilus influenzae type b (Hib) vaccine. A previously unvaccinated person with asplenia or sickle cell disease or having a scheduled splenectomy should receive 1 dose of Hib vaccine. Regardless of previous immunization, a recipient of a hematopoietic stem cell transplant should receive a 3-dose series 6-12 months after her successful transplant. Hib vaccine is not recommended for adults with HIV infection. Preventive Services / Frequency Ages 64 to 68 years  Blood pressure check.** / Every 1 to 2 years.  Lipid and cholesterol check.** / Every 5 years beginning at age  22.  Clinical breast exam.** / Every 3 years for women in their 88s and 53s.  BRCA-related cancer risk assessment.** / For women who have family members with a BRCA-related cancer (breast, ovarian, tubal, or peritoneal cancers).  Pap test.** / Every 2 years from ages 90 through 51. Every 3 years starting at age 21 through age 56 or 3 with a history of 3 consecutive normal Pap tests.  HPV screening.** / Every 3 years from ages 24 through ages 1 to 46 with a history of 3 consecutive normal Pap tests.  Hepatitis C blood test.** / For any individual with known risks for hepatitis C.  Skin self-exam. / Monthly.  Influenza vaccine. / Every year.  Tetanus, diphtheria, and acellular pertussis (Tdap, Td) vaccine.** / Consult your health care provider. Pregnant women should receive 1 dose of Tdap vaccine during each pregnancy. 1 dose of Td every 10 years.  Varicella vaccine.** / Consult your health care provider. Pregnant females who do not have evidence of immunity should receive the first dose after pregnancy.  HPV vaccine. / 3 doses over 6 months, if 72 and younger. The vaccine is not recommended for use in pregnant females. However, pregnancy testing is not needed before receiving a dose.  Measles, mumps, rubella (MMR) vaccine.** / You need at least 1 dose of MMR if you were born in 1957 or later. You may also need a 2nd dose. For females of childbearing age, rubella immunity should be determined. If there is no evidence of immunity, females who are not pregnant should be vaccinated. If there is no evidence of immunity, females who are pregnant should delay immunization until after pregnancy.  Pneumococcal 13-valent conjugate (PCV13) vaccine.** / Consult your health care provider.  Pneumococcal polysaccharide (PPSV23) vaccine.** / 1 to 2 doses if you smoke cigarettes or if you have certain conditions.  Meningococcal vaccine.** /  1 dose if you are age 19 to 21 years and a first-year college  student living in a residence hall, or have one of several medical conditions, you need to get vaccinated against meningococcal disease. You may also need additional booster doses.  Hepatitis A vaccine.** / Consult your health care provider.  Hepatitis B vaccine.** / Consult your health care provider.  Haemophilus influenzae type b (Hib) vaccine.** / Consult your health care provider. Ages 40 to 64 years  Blood pressure check.** / Every 1 to 2 years.  Lipid and cholesterol check.** / Every 5 years beginning at age 20 years.  Lung cancer screening. / Every year if you are aged 55-80 years and have a 30-pack-year history of smoking and currently smoke or have quit within the past 15 years. Yearly screening is stopped once you have quit smoking for at least 15 years or develop a health problem that would prevent you from having lung cancer treatment.  Clinical breast exam.** / Every year after age 40 years.  BRCA-related cancer risk assessment.** / For women who have family members with a BRCA-related cancer (breast, ovarian, tubal, or peritoneal cancers).  Mammogram.** / Every year beginning at age 40 years and continuing for as long as you are in good health. Consult with your health care provider.  Pap test.** / Every 3 years starting at age 30 years through age 65 or 70 years with a history of 3 consecutive normal Pap tests.  HPV screening.** / Every 3 years from ages 30 years through ages 65 to 70 years with a history of 3 consecutive normal Pap tests.  Fecal occult blood test (FOBT) of stool. / Every year beginning at age 50 years and continuing until age 75 years. You may not need to do this test if you get a colonoscopy every 10 years.  Flexible sigmoidoscopy or colonoscopy.** / Every 5 years for a flexible sigmoidoscopy or every 10 years for a colonoscopy beginning at age 50 years and continuing until age 75 years.  Hepatitis C blood test.** / For all people born from 1945 through  1965 and any individual with known risks for hepatitis C.  Skin self-exam. / Monthly.  Influenza vaccine. / Every year.  Tetanus, diphtheria, and acellular pertussis (Tdap/Td) vaccine.** / Consult your health care provider. Pregnant women should receive 1 dose of Tdap vaccine during each pregnancy. 1 dose of Td every 10 years.  Varicella vaccine.** / Consult your health care provider. Pregnant females who do not have evidence of immunity should receive the first dose after pregnancy.  Zoster vaccine.** / 1 dose for adults aged 60 years or older.  Measles, mumps, rubella (MMR) vaccine.** / You need at least 1 dose of MMR if you were born in 1957 or later. You may also need a 2nd dose. For females of childbearing age, rubella immunity should be determined. If there is no evidence of immunity, females who are not pregnant should be vaccinated. If there is no evidence of immunity, females who are pregnant should delay immunization until after pregnancy.  Pneumococcal 13-valent conjugate (PCV13) vaccine.** / Consult your health care provider.  Pneumococcal polysaccharide (PPSV23) vaccine.** / 1 to 2 doses if you smoke cigarettes or if you have certain conditions.  Meningococcal vaccine.** / Consult your health care provider.  Hepatitis A vaccine.** / Consult your health care provider.  Hepatitis B vaccine.** / Consult your health care provider.  Haemophilus influenzae type b (Hib) vaccine.** / Consult your health care provider. Ages 65   years and over  Blood pressure check.** / Every 1 to 2 years.  Lipid and cholesterol check.** / Every 5 years beginning at age 22 years.  Lung cancer screening. / Every year if you are aged 73-80 years and have a 30-pack-year history of smoking and currently smoke or have quit within the past 15 years. Yearly screening is stopped once you have quit smoking for at least 15 years or develop a health problem that would prevent you from having lung cancer  treatment.  Clinical breast exam.** / Every year after age 4 years.  BRCA-related cancer risk assessment.** / For women who have family members with a BRCA-related cancer (breast, ovarian, tubal, or peritoneal cancers).  Mammogram.** / Every year beginning at age 40 years and continuing for as long as you are in good health. Consult with your health care provider.  Pap test.** / Every 3 years starting at age 9 years through age 34 or 91 years with 3 consecutive normal Pap tests. Testing can be stopped between 65 and 70 years with 3 consecutive normal Pap tests and no abnormal Pap or HPV tests in the past 10 years.  HPV screening.** / Every 3 years from ages 57 years through ages 64 or 45 years with a history of 3 consecutive normal Pap tests. Testing can be stopped between 65 and 70 years with 3 consecutive normal Pap tests and no abnormal Pap or HPV tests in the past 10 years.  Fecal occult blood test (FOBT) of stool. / Every year beginning at age 15 years and continuing until age 17 years. You may not need to do this test if you get a colonoscopy every 10 years.  Flexible sigmoidoscopy or colonoscopy.** / Every 5 years for a flexible sigmoidoscopy or every 10 years for a colonoscopy beginning at age 86 years and continuing until age 71 years.  Hepatitis C blood test.** / For all people born from [birth year] through 1964 and any individual with known risks for hepatitis C.  Osteoporosis screening.** / A one-time screening for women ages 83 years and over and women at risk for fractures or osteoporosis.  Skin self-exam. / Monthly.  Influenza vaccine. / Every year.  Tetanus, diphtheria, and acellular pertussis (Tdap/Td) vaccine.** / 1 dose of Td every 10 years.  Varicella vaccine.** / Consult your health care provider.  Zoster vaccine.** / 1 dose for adults aged 61 years or older.  Pneumococcal 13-valent conjugate (PCV13) vaccine.** / Consult your health care provider.  Pneumococcal  polysaccharide (PPSV23) vaccine.** / 1 dose for all adults aged 28 years and older.  Meningococcal vaccine.** / Consult your health care provider.  Hepatitis A vaccine.** / Consult your health care provider.  Hepatitis B vaccine.** / Consult your health care provider.  Haemophilus influenzae type b (Hib) vaccine.** / Consult your health care provider. ** Family history and personal history of risk and conditions may change your health care provider's recommendations. Document Released: 04/20/2001 Document Revised: 07/09/2013 Document Reviewed: 07/20/2010 [facility] Patient Information 2015 [place], [place]. This information is not intended to replace advice given to you by your health care provider. Make sure you discuss any questions you have with your health care provider.

## 2014-05-22 NOTE — Progress Notes (Signed)
    Subjective:     [name] is a 34 y.o. [phone] female who presents for a postpartum visit. She is 8 weeks postpartum following a spontaneous vaginal delivery. I have fully reviewed the prenatal and intrapartum course. The delivery was at 38.2 gestational weeks. Outcome: spontaneous vaginal delivery. Anesthesia: none. Postpartum course has been uncomplicated. Baby's course has been uncomplicated.  Baby is feeding by both breast and bottle. Bleeding on her period now. Bowel function is normal. Bladder function is normal. Patient is not sexually active. Contraception method is OCP (estrogen/progesterone). Postpartum depression screening: negative.  The following portions of the patient's history were reviewed and updated as appropriate: allergies, current medications, past family history, past medical history, past social history, past surgical history and problem list.  Last pap in 09/2013 was LGSIL, had negative colposcopy in 10/2013. Needs repeat cotesting in one year.   Review of Systems Pertinent items are noted in HPI.   Objective:    BP 112/81 mmHg  Pulse 92  Temp(Src) 98.2 F (36.8 C)  Ht 5\' 3"  (1.6 m)  Wt 189 lb 8 oz (85.957 kg)  BMI 33.58 kg/m2  Breastfeeding? Yes  General:  alert and no distress   Breasts:  inspection negative, no nipple discharge or bleeding, no masses or nodularity palpable  Lungs: clear to auscultation bilaterally  Heart:  regular rate and rhythm  Abdomen: soft, non-tender; bowel sounds normal; no masses,  no organomegaly   Pelvic:  not evaluated        Assessment:   Normal postpartum exam. Pap smear not done at today's visit.   Plan:   1. Contraception: OCP (estrogen/progesterone) 2. Needs repeat cotesting after previous abnormal pap smear 3. Follow up as needed.     Jaynie CollinsUGONNA  Gideon Burstein, MD, FACOG Attending Obstetrician & Gynecologist Faculty Practice, [facility] [place]

## 2014-05-22 NOTE — Progress Notes (Signed)
Here for postpartum visit. States zoloft is helping. Wants birth control pills.

## 2014-11-26 ENCOUNTER — Encounter (HOSPITAL_COMMUNITY): Payer: Self-pay | Admitting: *Deleted

## 2014-11-26 ENCOUNTER — Emergency Department (HOSPITAL_COMMUNITY)
Admission: EM | Admit: 2014-11-26 | Discharge: 2014-11-26 | Disposition: A | Discharge status: Home / Self Care | Payer: Medicaid Other | Attending: Emergency Medicine | Admitting: Emergency Medicine

## 2014-11-26 DIAGNOSIS — L0291 Cutaneous abscess, unspecified: Secondary | ICD-10-CM

## 2014-11-26 DIAGNOSIS — Z7982 Long term (current) use of aspirin: Secondary | ICD-10-CM | POA: Insufficient documentation

## 2014-11-26 DIAGNOSIS — L02411 Cutaneous abscess of right axilla: Secondary | ICD-10-CM | POA: Diagnosis present

## 2014-11-26 DIAGNOSIS — Z79899 Other long term (current) drug therapy: Secondary | ICD-10-CM | POA: Insufficient documentation

## 2014-11-26 DIAGNOSIS — F329 Major depressive disorder, single episode, unspecified: Secondary | ICD-10-CM | POA: Insufficient documentation

## 2014-11-26 DIAGNOSIS — Z8719 Personal history of other diseases of the digestive system: Secondary | ICD-10-CM | POA: Insufficient documentation

## 2014-11-26 DIAGNOSIS — Z862 Personal history of diseases of the blood and blood-forming organs and certain disorders involving the immune mechanism: Secondary | ICD-10-CM | POA: Diagnosis not present

## 2014-11-26 DIAGNOSIS — I10 Essential (primary) hypertension: Secondary | ICD-10-CM | POA: Insufficient documentation

## 2014-11-26 MED ORDER — LIDOCAINE HCL 2 % IJ SOLN
20.0000 mL | Freq: Once | INTRAMUSCULAR | Status: AC
  Administered 2014-11-26: 400 mg
  Filled 2014-11-26: qty 20

## 2014-11-26 MED ORDER — FENTANYL CITRATE (PF) 100 MCG/2ML IJ SOLN
50.0000 ug | Freq: Once | INTRAMUSCULAR | Status: AC
  Administered 2014-11-26: 50 ug via INTRAMUSCULAR
  Filled 2014-11-26: qty 2

## 2014-11-26 MED ORDER — IBUPROFEN 400 MG PO TABS
600.0000 mg | ORAL_TABLET | Freq: Once | ORAL | Status: AC
  Administered 2014-11-26: 600 mg via ORAL
  Filled 2014-11-26: qty 2

## 2014-11-26 NOTE — ED Notes (Signed)
PT reports a abscess to RT axilla.

## 2014-11-26 NOTE — ED Provider Notes (Signed)
CSN: [id number]     Arrival date & time 11/26/14  4098 History   First MD Initiated Contact with Patient 11/26/14 0800     Chief Complaint  Patient presents with  . Recurrent Skin Infections   HPI   34 year old female presents today with abscess to right axilla. Patient reports significant past medical history of the same. She reports today's's complaint started approximately 4 days ago, has continued to swell. She denies any surrounding warmth, redness, fever, chills, nausea, vomiting. Patient reports pain with movement of the shoulder due to the pain in the axilla. No other lesions noted to the skin. Patient has not tried any over-the-counter therapies.   Past Medical History  Diagnosis Date  . GERD (gastroesophageal reflux disease)   . Acid reflux   . Hypertension   . Anemia   . Depression   . Vaginal Pap smear, abnormal   . Alcohol abuse   . H/O suicide attempt    Past Surgical History  Procedure Laterality Date  . Leep     Family History  Problem Relation Age of Onset  . Heart disease Mother   . Hyperlipidemia Mother   . Hypertension Mother   . Stroke Mother   . Asthma Sister   . Asthma Son    Social History  Substance Use Topics  . Smoking status: Never Smoker   . Smokeless tobacco: Never Used  . Alcohol Use: No     Comment: Hx alcohol abuse   OB History    Gravida Para Term Preterm AB TAB SAB Ectopic Multiple Living   0 2  2  0 2     Review of Systems  All other systems reviewed and are negative.   Allergies  Review of patient's allergies indicates no known allergies.  Home Medications   Prior to Admission medications   Medication Sig Start Date End Date Taking? Authorizing Provider  aspirin 81 MG chewable tablet Chew 81 mg by mouth daily.    Historical Provider, MD  flintstones complete (FLINTSTONES) 60 MG chewable tablet Chew 2 tablets by mouth daily.    Historical Provider, MD  norgestimate-ethinyl estradiol (ORTHO-CYCLEN,SPRINTEC,PREVIFEM)  0.25-35 MG-MCG tablet Take 1 tablet by mouth daily. 05/22/14   Tereso Newcomer, MD  potassium chloride (KLOR-CON) 20 MEQ packet Take 20 mEq by mouth 2 (two) times daily.    Historical Provider, MD  ranitidine (ZANTAC) 150 MG tablet Take 150 mg by mouth 2 (two) times daily.     Historical Provider, MD  sertraline (ZOLOFT) 100 MG tablet Take 1 tablet (100 mg total) by mouth daily. 04/19/14   Adam Phenix, MD   BP 148/98 mmHg  Pulse 74  Temp(Src) 98 F (36.7 C) (Oral)  Resp 18  Ht  (1.6 m)  Wt 223 lb (101.152 kg)  BMI 39.51 kg/m2  SpO2 96%  LMP 11/07/2014  Breastfeeding? Yes   Physical Exam  Constitutional: She is oriented to person, place, and time. She appears well-developed and well-nourished.  HENT:  Head: Normocephalic and atraumatic.  Eyes: Conjunctivae are normal. Pupils are equal, round, and reactive to light. Right eye exhibits no discharge. Left eye exhibits no discharge. No scleral icterus.  Neck: Normal range of motion. Neck supple. No JVD present. No tracheal deviation present.  Cardiovascular: Normal rate, regular rhythm, normal heart sounds and intact distal pulses.  Exam reveals no gallop and no friction rub.   No murmur heard. Pulmonary/Chest: Effort normal. No stridor.  Neurological: She is  alert and oriented to person, place, and time. Coordination normal.  Skin: Skin is warm and dry.  2 cm abscess to the right axilla, no surrounding erythema, warmth to touch. Distal sensation, strength, motor function intact.  Psychiatric: She has a normal mood and affect. Her behavior is normal. Judgment and thought content normal.  Nursing note and vitals reviewed.     ED Course  Procedures (including critical care time)  INCISION AND DRAINAGE Performed by: Thermon Leyland Consent: Verbal consent obtained. Risks and benefits: risks, benefits and alternatives were discussed Type: abscess  Body area: Right axilla  Anesthesia: local infiltration  Incision was  made with a scalpel.  Local anesthetic: lidocaine 2 percent   Anesthetic total: 6 mm  Complexity: complex Blunt dissection to break up loculations  Drainage: purulent  Drainage amount: 4  Packing material: none  Patient tolerance: Patient tolerated the procedure well with no immediate complications.   Labs Review  Labs Reviewed - No data to display  Imaging Review No results found. I have personally reviewed and evaluated these images and lab results as part of my medical decision-making.   EKG Interpretation None      MDM   Final diagnoses:  Abscess   Labs:  Imaging:  Consults:  Therapeutics: Lidocaine, fentanyl  Discharge Meds:   Assessment/Plan: Patient presents with the abscess to her right axilla. I&D performed here in the ED without complication. No signs of surrounding cellulitis, patient be discharged home with instructions to monitor for signs of infection, follow up with her primary care provider in 2 days for reevaluation. Patient verbalized understanding and agreement for today's plan and had no further questions or concerns at time of discharge.          Eyvonne Mechanic, PA-C 11/26/14 1301  Eyvonne Mechanic, PA-C 11/26/14 1302  Derwood Kaplan, MD 11/26/14 [phone]

## 2014-11-26 NOTE — Discharge Instructions (Signed)
Abscess °An abscess is an infected area that contains a collection of pus and debris. It can occur in almost any part of the body. An abscess is also known as a furuncle or boil. °CAUSES  °An abscess occurs when tissue gets infected. This can occur from blockage of oil or sweat glands, infection of hair follicles, or a minor injury to the skin. As the body tries to fight the infection, pus collects in the area and creates pressure under the skin. This pressure causes pain. People with weakened immune systems have difficulty fighting infections and get certain abscesses more often.  °SYMPTOMS °Usually an abscess develops on the skin and becomes a painful mass that is red, warm, and tender. If the abscess forms under the skin, you may feel a moveable soft area under the skin. Some abscesses break open (rupture) on their own, but most will continue to get worse without care. The infection can spread deeper into the body and eventually into the bloodstream, causing you to feel ill.  °DIAGNOSIS  °Your caregiver will take your medical history and perform a physical exam. A sample of fluid may also be taken from the abscess to determine what is causing your infection. °TREATMENT  °Your caregiver may prescribe antibiotic medicines to fight the infection. However, taking antibiotics alone usually does not cure an abscess. Your caregiver may need to make a small cut (incision) in the abscess to drain the pus. In some cases, gauze is packed into the abscess to reduce pain and to continue draining the area. °HOME CARE INSTRUCTIONS  °· Only take over-the-counter or prescription medicines for pain, discomfort, or fever as directed by your caregiver. °· If you were prescribed antibiotics, take them as directed. Finish them even if you start to feel better. °· If gauze is used, follow your caregiver's directions for changing the gauze. °· To avoid spreading the infection: °· Keep your draining abscess covered with a  bandage. °· Wash your hands well. °· Do not share personal care items, towels, or whirlpools with others. °· Avoid skin contact with others. °· Keep your skin and clothes clean around the abscess. °· Keep all follow-up appointments as directed by your caregiver. °SEEK MEDICAL CARE IF:  °· You have increased pain, swelling, redness, fluid drainage, or bleeding. °· You have muscle aches, chills, or a general ill feeling. °· You have a fever. °MAKE SURE YOU:  °· Understand these instructions. °· Will watch your condition. °· Will get help right away if you are not doing well or get worse. °Document Released: 12/02/2004 Document Revised: 08/24/2011 Document Reviewed: 05/07/2011 °[employer]® Patient Information ©2015 [employer]. This information is not intended to replace advice given to you by your health care provider. Make sure you discuss any questions you have with your health care provider. ° °Abscess °Care After °An abscess (also called a boil or furuncle) is an infected area that contains a collection of pus. Signs and symptoms of an abscess include pain, tenderness, redness, or hardness, or you may feel a moveable soft area under your skin. An abscess can occur anywhere in the body. The infection may spread to surrounding tissues causing cellulitis. A cut (incision) by the surgeon was made over your abscess and the pus was drained out. Gauze may have been packed into the space to provide a drain that will allow the cavity to heal from the inside outwards. The boil may be painful for 5 to 7 days. Most people with a boil do not have   high fevers. Your abscess, if seen early, may not have localized, and may not have been lanced. If not, another appointment may be required for this if it does not get better on its own or with medications. HOME CARE INSTRUCTIONS   Only take over-the-counter or prescription medicines for pain, discomfort, or fever as directed by your caregiver.  When you bathe, soak and then  remove gauze or iodoform packs at least daily or as directed by your caregiver. You may then wash the wound gently with mild soapy water. Repack with gauze or do as your caregiver directs. SEEK IMMEDIATE MEDICAL CARE IF:   You develop increased pain, swelling, redness, drainage, or bleeding in the wound site.  You develop signs of generalized infection including muscle aches, chills, fever, or a general ill feeling.  An oral temperature above 102 F (38.9 C) develops, not controlled by medication. See your caregiver for a recheck if you develop any of the symptoms described above. If medications (antibiotics) were prescribed, take them as directed. Document Released: 09/10/2004 Document Revised: 05/17/2011 Document Reviewed: 05/08/2007 [facility] Patient Information 2015 [place], [place]. This information is not intended to replace advice given to you by your health care provider. Make sure you discuss any questions you have with your health care provider.  Please reattach information, please follow-up with primary care provider in 2 days for reevaluation. Please return immediately if new worsening signs or symptoms present.

## 2014-11-26 NOTE — ED Notes (Signed)
Declined W/C at D/C and was escorted to lobby by RN. 

## 2014-11-29 ENCOUNTER — Encounter (HOSPITAL_COMMUNITY): Payer: Self-pay | Admitting: Emergency Medicine

## 2014-11-29 ENCOUNTER — Emergency Department (HOSPITAL_COMMUNITY)
Admission: EM | Admit: 2014-11-29 | Discharge: 2014-11-29 | Disposition: A | Discharge status: Home / Self Care | Payer: Medicaid Other | Attending: Emergency Medicine | Admitting: Emergency Medicine

## 2014-11-29 DIAGNOSIS — Z7951 Long term (current) use of inhaled steroids: Secondary | ICD-10-CM | POA: Insufficient documentation

## 2014-11-29 DIAGNOSIS — Z79899 Other long term (current) drug therapy: Secondary | ICD-10-CM | POA: Diagnosis not present

## 2014-11-29 DIAGNOSIS — Z915 Personal history of self-harm: Secondary | ICD-10-CM | POA: Insufficient documentation

## 2014-11-29 DIAGNOSIS — K219 Gastro-esophageal reflux disease without esophagitis: Secondary | ICD-10-CM | POA: Insufficient documentation

## 2014-11-29 DIAGNOSIS — Z5189 Encounter for other specified aftercare: Secondary | ICD-10-CM

## 2014-11-29 DIAGNOSIS — Z862 Personal history of diseases of the blood and blood-forming organs and certain disorders involving the immune mechanism: Secondary | ICD-10-CM | POA: Diagnosis not present

## 2014-11-29 DIAGNOSIS — Z4801 Encounter for change or removal of surgical wound dressing: Secondary | ICD-10-CM | POA: Diagnosis present

## 2014-11-29 DIAGNOSIS — F329 Major depressive disorder, single episode, unspecified: Secondary | ICD-10-CM | POA: Diagnosis not present

## 2014-11-29 DIAGNOSIS — Z7982 Long term (current) use of aspirin: Secondary | ICD-10-CM | POA: Diagnosis not present

## 2014-11-29 DIAGNOSIS — I10 Essential (primary) hypertension: Secondary | ICD-10-CM | POA: Diagnosis not present

## 2014-11-29 NOTE — ED Provider Notes (Signed)
CSN: [id number]     Arrival date & time 11/29/14  0734 History   First MD Initiated Contact with Patient 11/29/14 [phone]     Chief Complaint  Patient presents with  . Follow-up     (Consider location/radiation/quality/duration/timing/severity/associated sxs/prior Treatment) HPI Comments: Patient presents to the emergency department with chief complaint of foreign check. She states that she was seen 2 days ago for a right axillary abscess. She states that it is still draining but feels much better. She denies any worsening symptoms. She has been using warm compresses as directed. There are no aggravating or relieving factors. She denies any fevers chills.  The history is provided by the patient. No language interpreter was used.    Past Medical History  Diagnosis Date  . GERD (gastroesophageal reflux disease)   . Acid reflux   . Hypertension   . Anemia   . Depression   . Vaginal Pap smear, abnormal   . Alcohol abuse   . H/O suicide attempt    Past Surgical History  Procedure Laterality Date  . Leep     Family History  Problem Relation Age of Onset  . Heart disease Mother   . Hyperlipidemia Mother   . Hypertension Mother   . Stroke Mother   . Asthma Sister   . Asthma Son    Social History  Substance Use Topics  . Smoking status: Never Smoker   . Smokeless tobacco: Never Used  . Alcohol Use: No     Comment: Hx alcohol abuse   OB History    Gravida Para Term Preterm AB TAB SAB Ectopic Multiple Living   0 2  2  0 2     Review of Systems  Constitutional: Negative for fever and chills.  Respiratory: Negative for shortness of breath.   Cardiovascular: Negative for chest pain.  Gastrointestinal: Negative for nausea, vomiting, diarrhea and constipation.  Genitourinary: Negative for dysuria.  Skin: Positive for wound.      Allergies  Review of patient's allergies indicates no known allergies.  Home Medications   Prior to Admission medications   Medication  Sig Start Date End Date Taking? Authorizing Provider  aspirin 81 MG chewable tablet Chew 81 mg by mouth daily.    Historical Provider, MD  flintstones complete (FLINTSTONES) 60 MG chewable tablet Chew 2 tablets by mouth daily.    Historical Provider, MD  norgestimate-ethinyl estradiol (ORTHO-CYCLEN,SPRINTEC,PREVIFEM) 0.25-35 MG-MCG tablet Take 1 tablet by mouth daily. 05/22/14   Tereso Newcomer, MD  potassium chloride (KLOR-CON) 20 MEQ packet Take 20 mEq by mouth 2 (two) times daily.    Historical Provider, MD  ranitidine (ZANTAC) 150 MG tablet Take 150 mg by mouth 2 (two) times daily.     Historical Provider, MD  sertraline (ZOLOFT) 100 MG tablet Take 1 tablet (100 mg total) by mouth daily. 04/19/14   Adam Phenix, MD   BP 113/80 mmHg  Pulse 76  Temp(Src) 98.2 F (36.8 C) (Oral)  Resp 16  SpO2 100%  LMP 11/07/2014 Physical Exam  Constitutional: She is oriented to person, place, and time. She appears well-developed and well-nourished.  HENT:  Head: Normocephalic and atraumatic.  Eyes: Conjunctivae and EOM are normal.  Neck: Normal range of motion.  Cardiovascular: Normal rate.   Pulmonary/Chest: Effort normal.  Abdominal: She exhibits no distension.  Musculoskeletal: Normal range of motion.  Neurological: She is alert and oriented to person, place, and time.  Skin: Skin is dry.  Right axillary  incision from prior incision and drainage, no purulent discharge, no surrounding cellulitis or erythema  Psychiatric: She has a normal mood and affect. Her behavior is normal. Judgment and thought content normal.  Nursing note and vitals reviewed.   ED Course  Procedures (including critical care time) Labs Review Labs Reviewed - No data to display  Imaging Review No results found. I have personally reviewed and evaluated these images and lab results as part of my medical decision-making.   EKG Interpretation None      MDM   Final diagnoses:  Wound check, abscess    Patient  here for wound check from prior incision and drainage. Wound appears to be healing well. There is no further discharge drainage, no cellulitis or erythema. Patient feels much better. Will discharge to home. Patient understands agrees to plan. She is stable and ready for discharge.    Roxy Horseman, PA-C 11/29/14 0805  Lorre Nick, MD 11/30/14 [phone]

## 2014-11-29 NOTE — ED Notes (Signed)
Patient states she is here for follow up where she had abscess drained R axilla 2 x days ago.  Patient states area is still draining, but feels better.

## 2014-11-29 NOTE — Discharge Instructions (Signed)

## 2015-04-30 ENCOUNTER — Emergency Department (HOSPITAL_COMMUNITY): Payer: Medicaid Other

## 2015-04-30 ENCOUNTER — Encounter (HOSPITAL_COMMUNITY): Payer: Self-pay | Admitting: Emergency Medicine

## 2015-04-30 ENCOUNTER — Emergency Department (HOSPITAL_COMMUNITY)
Admission: EM | Admit: 2015-04-30 | Discharge: 2015-04-30 | Disposition: A | Discharge status: Home / Self Care | Payer: Medicaid Other | Attending: Emergency Medicine | Admitting: Emergency Medicine

## 2015-04-30 DIAGNOSIS — Z79899 Other long term (current) drug therapy: Secondary | ICD-10-CM | POA: Diagnosis not present

## 2015-04-30 DIAGNOSIS — K219 Gastro-esophageal reflux disease without esophagitis: Secondary | ICD-10-CM | POA: Diagnosis not present

## 2015-04-30 DIAGNOSIS — S299XXA Unspecified injury of thorax, initial encounter: Secondary | ICD-10-CM | POA: Diagnosis not present

## 2015-04-30 DIAGNOSIS — S4992XA Unspecified injury of left shoulder and upper arm, initial encounter: Secondary | ICD-10-CM | POA: Insufficient documentation

## 2015-04-30 DIAGNOSIS — Z7982 Long term (current) use of aspirin: Secondary | ICD-10-CM | POA: Insufficient documentation

## 2015-04-30 DIAGNOSIS — S199XXA Unspecified injury of neck, initial encounter: Secondary | ICD-10-CM | POA: Diagnosis not present

## 2015-04-30 DIAGNOSIS — F329 Major depressive disorder, single episode, unspecified: Secondary | ICD-10-CM | POA: Insufficient documentation

## 2015-04-30 DIAGNOSIS — Y92410 Unspecified street and highway as the place of occurrence of the external cause: Secondary | ICD-10-CM | POA: Insufficient documentation

## 2015-04-30 DIAGNOSIS — Y999 Unspecified external cause status: Secondary | ICD-10-CM | POA: Insufficient documentation

## 2015-04-30 DIAGNOSIS — M25512 Pain in left shoulder: Secondary | ICD-10-CM

## 2015-04-30 DIAGNOSIS — I10 Essential (primary) hypertension: Secondary | ICD-10-CM | POA: Diagnosis not present

## 2015-04-30 DIAGNOSIS — Z862 Personal history of diseases of the blood and blood-forming organs and certain disorders involving the immune mechanism: Secondary | ICD-10-CM | POA: Diagnosis not present

## 2015-04-30 DIAGNOSIS — Y9389 Activity, other specified: Secondary | ICD-10-CM | POA: Diagnosis not present

## 2015-04-30 DIAGNOSIS — Z793 Long term (current) use of hormonal contraceptives: Secondary | ICD-10-CM | POA: Insufficient documentation

## 2015-04-30 DIAGNOSIS — M542 Cervicalgia: Secondary | ICD-10-CM

## 2015-04-30 MED ORDER — IBUPROFEN 400 MG PO TABS
ORAL_TABLET | ORAL | Status: AC
  Filled 2015-04-30: qty 1

## 2015-04-30 MED ORDER — METHOCARBAMOL 500 MG PO TABS
500.0000 mg | ORAL_TABLET | Freq: Once | ORAL | Status: AC
  Administered 2015-04-30: 500 mg via ORAL
  Filled 2015-04-30: qty 1

## 2015-04-30 MED ORDER — METHOCARBAMOL 500 MG PO TABS: 500.0000 mg | ORAL_TABLET | Freq: Three times a day (TID) | ORAL | Status: DC | PRN

## 2015-04-30 MED ORDER — IBUPROFEN 600 MG PO TABS: 600.0000 mg | ORAL_TABLET | Freq: Three times a day (TID) | ORAL | Status: DC | PRN

## 2015-04-30 MED ORDER — OXYCODONE-ACETAMINOPHEN 5-325 MG PO TABS
ORAL_TABLET | ORAL | Status: AC
  Filled 2015-04-30: qty 1

## 2015-04-30 MED ORDER — OXYCODONE-ACETAMINOPHEN 5-325 MG PO TABS
1.0000 | ORAL_TABLET | Freq: Once | ORAL | Status: AC
  Administered 2015-04-30: 1 via ORAL

## 2015-04-30 MED ORDER — IBUPROFEN 200 MG PO TABS
ORAL_TABLET | ORAL | Status: AC
  Filled 2015-04-30: qty 1

## 2015-04-30 MED ORDER — IBUPROFEN 400 MG PO TABS
600.0000 mg | ORAL_TABLET | Freq: Once | ORAL | Status: AC
  Administered 2015-04-30: 600 mg via ORAL
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
## 2015-04-30 NOTE — Discharge Instructions (Signed)
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
## 2017-05-25 ENCOUNTER — Encounter (HOSPITAL_COMMUNITY): Payer: Self-pay

## 2017-05-25 ENCOUNTER — Other Ambulatory Visit: Payer: Self-pay

## 2017-05-25 ENCOUNTER — Emergency Department (HOSPITAL_COMMUNITY)
Admission: EM | Admit: 2017-05-25 | Discharge: 2017-05-25 | Disposition: A | Discharge status: Home / Self Care | Payer: Medicaid Other | Attending: Emergency Medicine | Admitting: Emergency Medicine

## 2017-05-25 DIAGNOSIS — L02411 Cutaneous abscess of right axilla: Secondary | ICD-10-CM | POA: Diagnosis not present

## 2017-05-25 DIAGNOSIS — Z7982 Long term (current) use of aspirin: Secondary | ICD-10-CM | POA: Diagnosis not present

## 2017-05-25 DIAGNOSIS — Z79899 Other long term (current) drug therapy: Secondary | ICD-10-CM | POA: Diagnosis not present

## 2017-05-25 DIAGNOSIS — F329 Major depressive disorder, single episode, unspecified: Secondary | ICD-10-CM | POA: Diagnosis not present

## 2017-05-25 DIAGNOSIS — L0291 Cutaneous abscess, unspecified: Secondary | ICD-10-CM

## 2017-05-25 DIAGNOSIS — I10 Essential (primary) hypertension: Secondary | ICD-10-CM | POA: Insufficient documentation

## 2017-05-25 DIAGNOSIS — R2231 Localized swelling, mass and lump, right upper limb: Secondary | ICD-10-CM | POA: Diagnosis present

## 2017-05-25 MED ORDER — LIDOCAINE HCL 2 % IJ SOLN
20.0000 mL | Freq: Once | INTRAMUSCULAR | Status: AC
  Administered 2017-05-25: 400 mg
  Filled 2017-05-25: qty 20

## 2017-05-25 MED ORDER — OXYCODONE-ACETAMINOPHEN 5-325 MG PO TABS
1.0000 | ORAL_TABLET | Freq: Once | ORAL | Status: AC
  Administered 2017-05-25: 1 via ORAL
  Filled 2017-05-25: qty 1

## 2017-05-25 MED ORDER — SULFAMETHOXAZOLE-TRIMETHOPRIM 800-160 MG PO TABS
1.0000 | ORAL_TABLET | Freq: Once | ORAL | Status: AC
  Administered 2017-05-25: 1 via ORAL
  Filled 2017-05-25: qty 1

## 2017-05-25 MED ORDER — OXYCODONE-ACETAMINOPHEN 5-325 MG PO TABS: 1.0000 | ORAL_TABLET | Freq: Four times a day (QID) | ORAL | 0 refills | Status: DC | PRN

## 2017-05-25 MED ORDER — SULFAMETHOXAZOLE-TRIMETHOPRIM 800-160 MG PO TABS: 1.0000 | ORAL_TABLET | Freq: Two times a day (BID) | ORAL | 0 refills | Status: AC

## 2017-05-25 NOTE — ED Triage Notes (Signed)
Pt states she has abscess under her right axilla. Hx of same. Afebrile. Pt denies hx of diabetes.

## 2017-05-25 NOTE — ED Provider Notes (Signed)
[facility] EMERGENCY DEPARTMENT Provider Note   CSN: [id number] Arrival date & time: 05/25/17  1609     History   Chief Complaint Chief Complaint  Patient presents with  . Abscess    HPI [name] is a 37 y.o. female.  HPI   37 year old female presents today with abscess to her right axilla.  She notes this started approximately 4-5 days ago.  She notes this is severely uncomfortable.  She denies any fever chills nausea or vomiting.  She reports history of the same requiring I&D.  Patient is not diabetic.  Past Medical History:  Diagnosis Date  . Acid reflux   . Alcohol abuse   . Anemia   . Depression   . GERD (gastroesophageal reflux disease)   . H/O suicide attempt   . Hypertension   . Vaginal Pap smear, abnormal     Patient Active Problem List   Diagnosis Date Noted  . Heart abnormality   . History of preterm delivery   . History of suicide attempt 10/23/2013  . Substance use disorder 10/23/2013  . History of depression 10/23/2013  . Abnormal Pap smear of cervix 10/23/2013  . Hypokalemia 08/08/2013  . Essential hypertension, benign 08/08/2013  . History of alcohol use 08/08/2013  . H/O alcoholic gastritis 08/08/2013    Past Surgical History:  Procedure Laterality Date  . LEEP      OB History    Gravida Para Term Preterm AB Living   5 3 3  0 2 2   SAB TAB Ectopic Multiple Live Births   2     0 2       Home Medications    Prior to Admission medications   Medication Sig Start Date End Date Taking? Authorizing Provider  aspirin 81 MG chewable tablet Chew 81 mg by mouth daily.    [provider]  flintstones complete (FLINTSTONES) 60 MG chewable tablet Chew 2 tablets by mouth daily.    [provider]  ibuprofen (ADVIL,MOTRIN) 600 MG tablet Take 1 tablet (600 mg total) by mouth every 8 (eight) hours as needed. 04/30/15   Azalia Bilis, MD  methocarbamol (ROBAXIN) 500 MG tablet Take 1 tablet (500 mg total)  by mouth every 8 (eight) hours as needed for muscle spasms. 04/30/15   Azalia Bilis, MD  norgestimate-ethinyl estradiol (ORTHO-CYCLEN,SPRINTEC,PREVIFEM) 0.25-35 MG-MCG tablet Take 1 tablet by mouth daily. 05/22/14   Anyanwu, Jethro Bastos, MD  oxyCODONE-acetaminophen (PERCOCET/ROXICET) 5-325 MG tablet Take 1 tablet by mouth every 6 (six) hours as needed for severe pain. 05/25/17   Kirstine Jacquin, Tinnie Gens, PA-C  potassium chloride (KLOR-CON) 20 MEQ packet Take 20 mEq by mouth 2 (two) times daily.    [provider]  ranitidine (ZANTAC) 150 MG tablet Take 150 mg by mouth 2 (two) times daily.     [provider]  sertraline (ZOLOFT) 100 MG tablet Take 1 tablet (100 mg total) by mouth daily. 04/19/14   Adam Phenix, MD  sulfamethoxazole-trimethoprim (BACTRIM DS,SEPTRA DS) 800-160 MG tablet Take 1 tablet by mouth 2 (two) times daily for 7 days. 05/25/17 06/01/17  Eyvonne Mechanic, PA-C    Family History Family History  Problem Relation Age of Onset  . Heart disease Mother   . Hyperlipidemia Mother   . Hypertension Mother   . Stroke Mother   . Asthma Sister   . Asthma Son     Social History Social History   Tobacco Use  . Smoking status: Never Smoker  .  Smokeless tobacco: Never Used  Substance Use Topics  . Alcohol use: Yes    Comment: Hx alcohol abuse  . Drug use: No    Comment: occasionally     Allergies   Patient has no known allergies.   Review of Systems Review of Systems  All other systems reviewed and are negative.   Physical Exam Updated Vital Signs BP 131/81 (BP Location: Right Arm)   Pulse 69   Temp 97.9 F (36.6 C) (Oral)   Resp 18   LMP 05/06/2017 (Within Days)   SpO2 99%   Physical Exam  Constitutional: She is oriented to person, place, and time. She appears well-developed and well-nourished.  HENT:  Head: Normocephalic and atraumatic.  Eyes: Conjunctivae are normal. Pupils are equal, round, and reactive to light. Right eye exhibits no discharge. Left  eye exhibits no discharge. No scleral icterus.  Neck: Normal range of motion. No JVD present. No tracheal deviation present.  Pulmonary/Chest: Effort normal. No stridor.  Musculoskeletal:  4 cm of induration to her right axilla with central fluctuance no significant overlying redness  Neurological: She is alert and oriented to person, place, and time. Coordination normal.  Psychiatric: She has a normal mood and affect. Her behavior is normal. Judgment and thought content normal.  Nursing note and vitals reviewed.    ED Treatments / Results  Labs (all labs ordered are listed, but only abnormal results are displayed) Labs Reviewed - No data to display  EKG  EKG Interpretation None       Radiology No results found.  Procedures .Marland KitchenIncision and Drainage Date/Time: 05/25/2017 9:24 PM Performed by: Eyvonne Mechanic, PA-C Authorized by: Eyvonne Mechanic, PA-C   Consent:    Consent obtained:  Verbal   Consent given by:  Patient   Risks discussed:  Bleeding, damage to other organs, infection, incomplete drainage and pain   Alternatives discussed:  No treatment, delayed treatment and alternative treatment Location:    Type:  Abscess   Size:  4   Location: Right axilla. Pre-procedure details:    Skin preparation:  Betadine Anesthesia (see MAR for exact dosages):    Anesthesia method:  Local infiltration   Local anesthetic:  Lidocaine 2% w/o epi Procedure type:    Complexity:  Simple Procedure details:    Needle aspiration: no     Incision types:  Single straight   Incision depth:  Dermal   Scalpel blade:  11   Wound management:  Probed and deloculated and irrigated with saline   Drainage:  Purulent   Drainage amount:  Copious   Wound treatment:  Wound left open   Packing materials:  None Post-procedure details:    Patient tolerance of procedure:  Tolerated well, no immediate complications   (including critical care time)  Medications Ordered in ED Medications    oxyCODONE-acetaminophen (PERCOCET/ROXICET) 5-325 MG per tablet 1 tablet (1 tablet Oral Given 05/25/17 1808)  lidocaine (XYLOCAINE) 2 % (with pres) injection 400 mg (400 mg Infiltration Given 05/25/17 1808)  sulfamethoxazole-trimethoprim (BACTRIM DS,SEPTRA DS) 800-160 MG per tablet 1 tablet (1 tablet Oral Given 05/25/17 2009)     Initial Impression / Assessment and Plan / ED Course  I have reviewed the triage vital signs and the nursing notes.  Pertinent labs & imaging results that were available during my care of the patient were reviewed by me and considered in my medical decision making (see chart for details).     Final Clinical Impressions(s) / ED Diagnoses   Final diagnoses:  Abscess   37 year old female presents today with abscess to right axilla.  I&D was successful here.  Patient discharged home on Bactrim, pain medicine.  Patient given return precautions, she verbalized understanding and agreement to today's plan had no further questions or concerns.   ED Discharge Orders        Ordered    sulfamethoxazole-trimethoprim (BACTRIM DS,SEPTRA DS) 800-160 MG tablet  2 times daily     05/25/17 2003    oxyCODONE-acetaminophen (PERCOCET/ROXICET) 5-325 MG tablet  Every 6 hours PRN     05/25/17 2004       Eyvonne MechanicHedges, Jaedan Huttner, Cordelia Poche-C 05/25/17 2125    Mancel BaleWentz, Elliott, MD 05/26/17 [phone]

## 2017-05-25 NOTE — Discharge Instructions (Signed)
Please read attached information. If you experience any new or worsening signs or symptoms please return to the emergency room for evaluation. Please follow-up with your primary care provider or specialist as discussed. Please use medication prescribed only as directed and discontinue taking if you have any concerning signs or symptoms.   °

## 2017-08-08 ENCOUNTER — Emergency Department (HOSPITAL_COMMUNITY)
Admission: EM | Admit: 2017-08-08 | Discharge: 2017-08-08 | Disposition: A | Discharge status: Home / Self Care | Payer: Medicaid Other | Attending: Emergency Medicine | Admitting: Emergency Medicine

## 2017-08-08 ENCOUNTER — Emergency Department (HOSPITAL_COMMUNITY): Payer: Medicaid Other

## 2017-08-08 DIAGNOSIS — X500XXA Overexertion from strenuous movement or load, initial encounter: Secondary | ICD-10-CM | POA: Insufficient documentation

## 2017-08-08 DIAGNOSIS — Y929 Unspecified place or not applicable: Secondary | ICD-10-CM | POA: Insufficient documentation

## 2017-08-08 DIAGNOSIS — Z79899 Other long term (current) drug therapy: Secondary | ICD-10-CM | POA: Diagnosis not present

## 2017-08-08 DIAGNOSIS — S39012A Strain of muscle, fascia and tendon of lower back, initial encounter: Secondary | ICD-10-CM

## 2017-08-08 DIAGNOSIS — Z3201 Encounter for pregnancy test, result positive: Secondary | ICD-10-CM | POA: Insufficient documentation

## 2017-08-08 DIAGNOSIS — Z3A01 Less than 8 weeks gestation of pregnancy: Secondary | ICD-10-CM | POA: Insufficient documentation

## 2017-08-08 DIAGNOSIS — Z7982 Long term (current) use of aspirin: Secondary | ICD-10-CM | POA: Diagnosis not present

## 2017-08-08 DIAGNOSIS — O10019 Pre-existing essential hypertension complicating pregnancy, unspecified trimester: Secondary | ICD-10-CM | POA: Diagnosis not present

## 2017-08-08 DIAGNOSIS — Y999 Unspecified external cause status: Secondary | ICD-10-CM | POA: Diagnosis not present

## 2017-08-08 DIAGNOSIS — Y9389 Activity, other specified: Secondary | ICD-10-CM | POA: Insufficient documentation

## 2017-08-08 DIAGNOSIS — O9989 Other specified diseases and conditions complicating pregnancy, childbirth and the puerperium: Secondary | ICD-10-CM | POA: Diagnosis present

## 2017-08-08 LAB — I-STAT CHEM 8, ED
BUN: 17 mg/dL (ref 6–20)
CALCIUM ION: 1.19 mmol/L (ref 1.15–1.40)
CHLORIDE: 105 mmol/L (ref 101–111)
Creatinine, Ser: 0.9 mg/dL (ref 0.44–1.00)
Glucose, Bld: 115 mg/dL — ABNORMAL HIGH (ref 65–99)
HCT: 40 % (ref 36.0–46.0)
Hemoglobin: 13.6 g/dL (ref 12.0–15.0)
Potassium: 3.7 mmol/L (ref 3.5–5.1)
Sodium: 141 mmol/L (ref 135–145)
TCO2: 22 mmol/L (ref 22–32)

## 2017-08-08 LAB — POC URINE PREG, ED: Preg Test, Ur: POSITIVE — AB

## 2017-08-08 LAB — I-STAT BETA HCG BLOOD, ED (MC, WL, AP ONLY): I-stat hCG, quantitative: 772.7 m[IU]/mL — ABNORMAL HIGH (ref <5)

## 2017-08-08 MED ORDER — ACETAMINOPHEN 500 MG PO TABS
1000.0000 mg | ORAL_TABLET | Freq: Once | ORAL | Status: AC
  Administered 2017-08-08: 1000 mg via ORAL
  Filled 2017-08-08: qty 2

## 2017-08-08 MED ORDER — ACETAMINOPHEN 500 MG PO TABS: 1000.0000 mg | ORAL_TABLET | Freq: Three times a day (TID) | ORAL | 0 refills | Status: AC

## 2017-08-08 NOTE — ED Triage Notes (Signed)
Pt reports sudden onset of lower back pain as she was picking up her child.  Pt also took a pregnancy test and it was positive.  She wondered if the pain was coming from the pregnancy.

## 2017-08-08 NOTE — ED Notes (Signed)
Pt returned from US

## 2017-08-08 NOTE — ED Notes (Signed)
Patient transported to Ultrasound 

## 2017-08-08 NOTE — Discharge Instructions (Signed)
You may use over-the-counter Acetaminophen (Tylenol), topical muscle creams such as SalonPas, Icy Hot, Bengay, etc. Please stretch, apply heat, and have massage therapy for additional assistance.  

## 2017-08-08 NOTE — ED Provider Notes (Signed)
[facility] EMERGENCY DEPARTMENT Provider Note  CSN: [id number] Arrival date & time: 08/08/17 4098  Chief Complaint(s) Back Pain and Possible Pregnancy  HPI [name] is a 37 y.o. female G5P3  The history is provided by the patient.  Back Pain   This is a new problem. The current episode started 2 days ago. The problem occurs constantly. The problem has not changed since onset.The pain is associated with lifting heavy objects. The pain is present in the lumbar spine. Quality: pressure. The pain does not radiate. The pain is severe. The symptoms are aggravated by certain positions, twisting and bending. Pertinent negatives include no fever, no abdominal pain, no bowel incontinence, no perianal numbness, no bladder incontinence, no paresthesias and no weakness. She has tried nothing for the symptoms. The treatment provided no relief.  Possible Pregnancy  Pertinent negatives include no abdominal pain.   LMP 07/07/17 approx.   Past Medical History Past Medical History:  Diagnosis Date  . Acid reflux   . Alcohol abuse   . Anemia   . Depression   . GERD (gastroesophageal reflux disease)   . H/O suicide attempt   . Hypertension   . Vaginal Pap smear, abnormal    Patient Active Problem List   Diagnosis Date Noted  . Heart abnormality   . History of preterm delivery   . History of suicide attempt 10/23/2013  . Substance use disorder 10/23/2013  . History of depression 10/23/2013  . Abnormal Pap smear of cervix 10/23/2013  . Hypokalemia 08/08/2013  . Essential hypertension, benign 08/08/2013  . History of alcohol use 08/08/2013  . H/O alcoholic gastritis 08/08/2013   Home Medication(s) Prior to Admission medications   Medication Sig Start Date End Date Taking? Authorizing Provider  acetaminophen (TYLENOL) 500 MG tablet Take 2 tablets (1,000 mg total) by mouth every 8 (eight) hours for 5 days. Do not take more than 4000 mg of acetaminophen (Tylenol) in a  24-hour period. Please note that other medicines that you may be prescribed may have Tylenol as well. 08/08/17 08/13/17  Nira Conn, MD  aspirin 81 MG chewable tablet Chew 81 mg by mouth daily.    [provider]  flintstones complete (FLINTSTONES) 60 MG chewable tablet Chew 2 tablets by mouth daily.    [provider]  ibuprofen (ADVIL,MOTRIN) 600 MG tablet Take 1 tablet (600 mg total) by mouth every 8 (eight) hours as needed. 04/30/15   Azalia Bilis, MD  methocarbamol (ROBAXIN) 500 MG tablet Take 1 tablet (500 mg total) by mouth every 8 (eight) hours as needed for muscle spasms. 04/30/15   Azalia Bilis, MD  norgestimate-ethinyl estradiol (ORTHO-CYCLEN,SPRINTEC,PREVIFEM) 0.25-35 MG-MCG tablet Take 1 tablet by mouth daily. 05/22/14   Anyanwu, Jethro Bastos, MD  oxyCODONE-acetaminophen (PERCOCET/ROXICET) 5-325 MG tablet Take 1 tablet by mouth every 6 (six) hours as needed for severe pain. 05/25/17   Hedges, Tinnie Gens, PA-C  potassium chloride (KLOR-CON) 20 MEQ packet Take 20 mEq by mouth 2 (two) times daily.    [provider]  ranitidine (ZANTAC) 150 MG tablet Take 150 mg by mouth 2 (two) times daily.     [provider]  sertraline (ZOLOFT) 100 MG tablet Take 1 tablet (100 mg total) by mouth daily. 04/19/14   Adam Phenix, MD  Past Surgical History Past Surgical History:  Procedure Laterality Date  . LEEP     Family History Family History  Problem Relation Age of Onset  . Heart disease Mother   . Hyperlipidemia Mother   . Hypertension Mother   . Stroke Mother   . Asthma Sister   . Asthma Son     Social History Social History   Tobacco Use  . Smoking status: Never Smoker  . Smokeless tobacco: Never Used  Substance Use Topics  . Alcohol use: Yes    Comment: Hx alcohol abuse  . Drug use: No    Types: Marijuana     Comment: occasionally   Allergies Patient has no known allergies.  Review of Systems Review of Systems  Constitutional: Negative for fever.  Gastrointestinal: Negative for abdominal pain and bowel incontinence.  Genitourinary: Negative for bladder incontinence.  Musculoskeletal: Positive for back pain.  Neurological: Negative for weakness and paresthesias.   All other systems are reviewed and are negative for acute change except as noted in the HPI  Physical Exam Vital Signs  I have reviewed the triage vital signs BP (!) 132/91 (BP Location: Right Arm)   Pulse (!) 104   Temp 98.4 F (36.9 C) (Oral)   Resp 16   Ht 5\' 3"  (1.6 m)   Wt 97.5 kg (215 lb)   SpO2 98%   BMI 38.09 kg/m   Physical Exam  Constitutional: She is oriented to person, place, and time. She appears well-developed and well-nourished. No distress.  HENT:  Head: Normocephalic and atraumatic.  Right Ear: External ear normal.  Left Ear: External ear normal.  Nose: Nose normal.  Eyes: Conjunctivae and EOM are normal. No scleral icterus.  Neck: Normal range of motion and phonation normal.  Cardiovascular: Normal rate and regular rhythm.  Pulmonary/Chest: Effort normal. No stridor. No respiratory distress.  Abdominal: She exhibits no distension.  Musculoskeletal: Normal range of motion. She exhibits no edema.       Lumbar back: She exhibits tenderness and spasm. She exhibits no bony tenderness.       Back:  Neurological: She is alert and oriented to person, place, and time.  Spine Exam: Strength: 5/5 throughout LE bilaterally (hip flexion/extension, adduction/abduction; knee flexion/extension; foot dorsiflexion/plantarflexion, inversion/eversion; great toe inversion) Sensation: Intact to light touch in proximal and distal LE bilaterally Reflexes: 1+ quadriceps and achilles reflexes   Skin: She is not diaphoretic.  Psychiatric: She has a normal mood and affect. Her behavior is normal.  Vitals  reviewed.   ED Results and Treatments Labs (all labs ordered are listed, but only abnormal results are displayed) Labs Reviewed  POC URINE PREG, ED - Abnormal; Notable for the following components:      Result Value   Preg Test, Ur POSITIVE (*)    All other components within normal limits  I-STAT BETA HCG BLOOD, ED ([facility], [facility], [facility] ONLY) - Abnormal; Notable for the following components:   I-stat hCG, quantitative 772.7 (*)    All other components within normal limits  I-STAT CHEM 8, ED - Abnormal; Notable for the following components:   Glucose, Bld 115 (*)    All other components within normal limits  EKG  EKG Interpretation  Date/Time:    Ventricular Rate:    PR Interval:    QRS Duration:   QT Interval:    QTC Calculation:   R Axis:     Text Interpretation:        Radiology US Ob Comp < 14 Wks  Result Date: 08/08/2017 CLINICAL DATA:  Acute onset of back pain. EXAM: OBSTETRIC <14 WK [place] AND TRANSVAGINAL OB US TECHNIQUE: Both transabdominal and transvaginal ultrasound examinations were performed for complete evaluation of the gestation as well as the maternal uterus, adnexal regions, and pelvic cul-de-sac. Transvaginal technique was performed to assess early pregnancy. COMPARISON:  Pelvic ultrasound performed 02/20/2014 FINDINGS: Intrauterine gestational sac: Single; visualized and normal in shape. Yolk sac:  No Embryo:  No MSD: 3.9  mm   5 w   1  d Subchorionic hemorrhage:  None visualized. Maternal uterus/adnexae: The uterus is otherwise unremarkable. The ovaries are within normal limits. The right ovary measures 3.3 x 2.3 x 2.9 cm, while the left ovary measures 2.6 x 1.2 x 2.0 cm. There is a small hypoechoic tubular structure at the right adnexa adjacent to the right ovary, measuring 1.0 cm in diameter, with internal echoes. This could reflect mild hydrosalpinx. Would  correlate clinically for evidence of infection to exclude pyosalpinx. No free fluid is seen within the pelvic cul-de-sac. IMPRESSION: 1. Single intrauterine gestational sac noted, with a mean sac diameter of 4 mm. This reflects an estimated date of delivery of 5 weeks 1 day. This matches the gestational age by LMP of 5 weeks 0 days, reflecting an estimated date of delivery of April 10, 2018. No yolk sac or embryo is yet seen, within normal limits. 2. Small hypoechoic tubular structure at the right adnexa measuring 1.0 cm in diameter, with internal echoes. This could reflect mild hydrosalpinx. Would correlate clinically for evidence of infection to exclude pyosalpinx. Electronically Signed   By: Roanna Raider M.D.   On: 08/08/2017 06:54   US Ob Transvaginal  Result Date: 08/08/2017 CLINICAL DATA:  Acute onset of back pain. EXAM: OBSTETRIC <14 WK [place] AND TRANSVAGINAL OB US TECHNIQUE: Both transabdominal and transvaginal ultrasound examinations were performed for complete evaluation of the gestation as well as the maternal uterus, adnexal regions, and pelvic cul-de-sac. Transvaginal technique was performed to assess early pregnancy. COMPARISON:  Pelvic ultrasound performed 02/20/2014 FINDINGS: Intrauterine gestational sac: Single; visualized and normal in shape. Yolk sac:  No Embryo:  No MSD: 3.9  mm   5 w   1  d Subchorionic hemorrhage:  None visualized. Maternal uterus/adnexae: The uterus is otherwise unremarkable. The ovaries are within normal limits. The right ovary measures 3.3 x 2.3 x 2.9 cm, while the left ovary measures 2.6 x 1.2 x 2.0 cm. There is a small hypoechoic tubular structure at the right adnexa adjacent to the right ovary, measuring 1.0 cm in diameter, with internal echoes. This could reflect mild hydrosalpinx. Would correlate clinically for evidence of infection to exclude pyosalpinx. No free fluid is seen within the pelvic cul-de-sac. IMPRESSION: 1. Single intrauterine gestational sac noted,  with a mean sac diameter of 4 mm. This reflects an estimated date of delivery of 5 weeks 1 day. This matches the gestational age by LMP of 5 weeks 0 days, reflecting an estimated date of delivery of April 10, 2018. No yolk sac or embryo is yet seen, within normal limits. 2. Small hypoechoic tubular structure at the right adnexa measuring 1.0 cm in diameter, with internal echoes.  This could reflect mild hydrosalpinx. Would correlate clinically for evidence of infection to exclude pyosalpinx. Electronically Signed   By: Roanna RaiderJeffery  Chang M.D.   On: 08/08/2017 06:54   Pertinent labs & imaging results that were available during my care of the patient were reviewed by me and considered in my medical decision making (see chart for details).  Medications Ordered in ED Medications  acetaminophen (TYLENOL) tablet 1,000 mg (1,000 mg Oral Given 08/08/17 0547)                                                                                                                                    Procedures Procedures  (including critical care time)  Medical Decision Making / ED Course I have reviewed the nursing notes for this encounter and the patient's prior records (if available in EHR or on provided paperwork).    37 y.o. female presents with back pain in lumbar area for 2 days without signs of radicular pain. No acute traumatic onset. No red flag symptoms of fever, weight loss, saddle anesthesia, weakness, fecal/urinary incontinence or urinary retention.   Patient reports positive pregnancy test at home.  Confirmed positive UPT here.  Patient without abdominal pain or pelvic pain.  No vaginal bleeding.  HCG 772.  Other labs grossly reassuring.  Upon record review patient's blood type A+.  Ultrasound confirmed intrauterine gestational sac without yolk sac visualized.  Consistent with 5-week pregnancy which correlates with patient's last menstrual cycle.  Suspect MSK etiology.  No indication for imaging  emergently. Patient was recommended to take short course of scheduled NSAIDs and engage in early mobility as definitive treatment.   I have low suspicion for ectopic pregnancy however patient was encouraged to follow-up with OB to trend hCG.   Return precautions discussed for worsening or new concerning symptoms.    Final Clinical Impression(s) / ED Diagnoses Final diagnoses:  Strain of lumbar region, initial encounter  Less than [redacted] weeks gestation of pregnancy   Disposition: Discharge  Condition: Good  I have discussed the results, Dx and Tx plan with the patient who expressed understanding and agree(s) with the plan. Discharge instructions discussed at great length. The patient was given strict return precautions who verbalized understanding of the instructions. No further questions at time of discharge.    ED Discharge Orders        Ordered    acetaminophen (TYLENOL) 500 MG tablet  Every 8 hours     08/08/17 0742       [facility] [address] [place] [id number] [phone] [facility]      This chart was dictated using voice recognition software.  Despite best efforts to proofread,  errors can occur which can change the documentation meaning.   Nira Connardama, Tyhesha Dutson Eduardo, MD 08/08/17 [phone]

## 2017-12-10 ENCOUNTER — Emergency Department (HOSPITAL_COMMUNITY)
Admission: EM | Admit: 2017-12-10 | Discharge: 2017-12-10 | Disposition: A | Discharge status: Home / Self Care | Payer: Medicaid Other | Attending: Emergency Medicine | Admitting: Emergency Medicine

## 2017-12-10 ENCOUNTER — Encounter (HOSPITAL_COMMUNITY): Payer: Self-pay

## 2017-12-10 ENCOUNTER — Emergency Department (HOSPITAL_COMMUNITY): Payer: Medicaid Other

## 2017-12-10 ENCOUNTER — Other Ambulatory Visit: Payer: Self-pay

## 2017-12-10 DIAGNOSIS — W010XXA Fall on same level from slipping, tripping and stumbling without subsequent striking against object, initial encounter: Secondary | ICD-10-CM | POA: Diagnosis not present

## 2017-12-10 DIAGNOSIS — S8001XA Contusion of right knee, initial encounter: Secondary | ICD-10-CM | POA: Diagnosis not present

## 2017-12-10 DIAGNOSIS — Y93K1 Activity, walking an animal: Secondary | ICD-10-CM | POA: Insufficient documentation

## 2017-12-10 DIAGNOSIS — I10 Essential (primary) hypertension: Secondary | ICD-10-CM | POA: Diagnosis not present

## 2017-12-10 DIAGNOSIS — S8992XA Unspecified injury of left lower leg, initial encounter: Secondary | ICD-10-CM

## 2017-12-10 DIAGNOSIS — Y9289 Other specified places as the place of occurrence of the external cause: Secondary | ICD-10-CM | POA: Diagnosis not present

## 2017-12-10 DIAGNOSIS — Y998 Other external cause status: Secondary | ICD-10-CM | POA: Diagnosis not present

## 2017-12-10 MED ORDER — NAPROXEN 250 MG PO TABS
500.0000 mg | ORAL_TABLET | Freq: Once | ORAL | Status: DC
Start: 2017-12-10 — End: 2017-12-10

## 2017-12-10 MED ORDER — OXYCODONE-ACETAMINOPHEN 5-325 MG PO TABS
1.0000 | ORAL_TABLET | Freq: Once | ORAL | Status: AC
  Administered 2017-12-10: 1 via ORAL
  Filled 2017-12-10: qty 1

## 2017-12-10 MED ORDER — NAPROXEN 500 MG PO TABS: 500.0000 mg | ORAL_TABLET | Freq: Two times a day (BID) | ORAL | 0 refills | Status: DC

## 2017-12-10 MED ORDER — ONDANSETRON 4 MG PO TBDP
4.0000 mg | ORAL_TABLET | Freq: Once | ORAL | Status: AC
  Administered 2017-12-10: 4 mg via ORAL
  Filled 2017-12-10: qty 1

## 2017-12-10 NOTE — ED Triage Notes (Signed)
Patient states that she was tripped by her dogs last night and fell on both knees. Complains of burning pain to both and upper neck pain

## 2017-12-10 NOTE — ED Notes (Signed)
Pt started vomiting due to "pain" when trying to stand with knee immobilizer

## 2017-12-10 NOTE — ED Provider Notes (Addendum)
[facility] EMERGENCY DEPARTMENT Provider Note   CSN: [id number] Arrival date & time: 12/10/17  1423     History   Chief Complaint Knee Pain  HPI [name] is a 37 y.o. female with past medical history of hypertension, GERD, presenting to the emergency department with complaint of gradual onset of bilateral knee pain that began yesterday, worsening this morning.  Patient states she was walking her dogs and tripped last night falling forward onto both of her knees.  Did not feel anything shift out of place during the fall.  Reports significant pain to the left knee with any movement or palpation, with associated swelling.  She also has some pain to the right knee but is not nearly as bad as the left.  Knees feel better when they are straight.  Unable to ambulate due to pain in her left knee.  Denies head trauma or LOC.  Denies any other associated symptoms.  No medications tried prior to arrival.  The history is provided by the patient.    Past Medical History:  Diagnosis Date  . Acid reflux   . Alcohol abuse   . Anemia   . Depression   . GERD (gastroesophageal reflux disease)   . H/O suicide attempt   . Hypertension   . Vaginal Pap smear, abnormal     Patient Active Problem List   Diagnosis Date Noted  . Heart abnormality   . History of preterm delivery   . History of suicide attempt 10/23/2013  . Substance use disorder 10/23/2013  . History of depression 10/23/2013  . Abnormal Pap smear of cervix 10/23/2013  . Hypokalemia 08/08/2013  . Essential hypertension, benign 08/08/2013  . History of alcohol use 08/08/2013  . H/O alcoholic gastritis 08/08/2013    Past Surgical History:  Procedure Laterality Date  . LEEP       OB History    Gravida  5   Para  3   Term  3   Preterm  0   AB  2   Living  2     SAB  2   TAB      Ectopic      Multiple  0   Live Births  2            Home Medications    Prior to Admission  medications   Medication Sig Start Date End Date Taking? Authorizing Provider  aspirin 81 MG chewable tablet Chew 81 mg by mouth daily.    [provider]  flintstones complete (FLINTSTONES) 60 MG chewable tablet Chew 2 tablets by mouth daily.    [provider]  ibuprofen (ADVIL,MOTRIN) 600 MG tablet Take 1 tablet (600 mg total) by mouth every 8 (eight) hours as needed. 04/30/15   Azalia Bilis, MD  methocarbamol (ROBAXIN) 500 MG tablet Take 1 tablet (500 mg total) by mouth every 8 (eight) hours as needed for muscle spasms. 04/30/15   Azalia Bilis, MD  naproxen (NAPROSYN) 500 MG tablet Take 1 tablet (500 mg total) by mouth 2 (two) times daily with a meal. 12/10/17   , [place] N, PA-C  norgestimate-ethinyl estradiol (ORTHO-CYCLEN,SPRINTEC,PREVIFEM) 0.25-35 MG-MCG tablet Take 1 tablet by mouth daily. 05/22/14   Anyanwu, Jethro Bastos, MD  oxyCODONE-acetaminophen (PERCOCET/ROXICET) 5-325 MG tablet Take 1 tablet by mouth every 6 (six) hours as needed for severe pain. 05/25/17   Hedges, Tinnie Gens, PA-C  potassium chloride (KLOR-CON) 20 MEQ packet Take 20 mEq by mouth 2 (two) times  daily.    [provider]  ranitidine (ZANTAC) 150 MG tablet Take 150 mg by mouth 2 (two) times daily.     [provider]  sertraline (ZOLOFT) 100 MG tablet Take 1 tablet (100 mg total) by mouth daily. 04/19/14   Adam Phenix, MD    Family History Family History  Problem Relation Age of Onset  . Heart disease Mother   . Hyperlipidemia Mother   . Hypertension Mother   . Stroke Mother   . Asthma Sister   . Asthma Son     Social History Social History   Tobacco Use  . Smoking status: Never Smoker  . Smokeless tobacco: Never Used  Substance Use Topics  . Alcohol use: Yes    Comment: Hx alcohol abuse  . Drug use: No    Types: Marijuana    Comment: occasionally     Allergies   Patient has no known allergies.   Review of Systems Review of Systems  Musculoskeletal:  Positive for arthralgias and joint swelling.  Skin: Negative for color change and wound.  Neurological: Negative for numbness.  All other systems reviewed and are negative.    Physical Exam Updated Vital Signs BP 112/86 (BP Location: Right Arm)   Pulse (!) 101   Temp 98.5 F (36.9 C) (Oral)   Resp 18   LMP 12/06/2017   SpO2 100%   Physical Exam  Constitutional: She appears well-developed and well-nourished.  Patient appears very uncomfortable.  HENT:  Head: Normocephalic and atraumatic.  Eyes: Conjunctivae are normal.  Cardiovascular: Normal rate and intact distal pulses.  Pulmonary/Chest: Effort normal.  Musculoskeletal:  Significant tenderness to light touch of left knee.  Knee is significantly swollen without obvious deformity.  There is some bruising to the lateral aspect of the left knee.  Unable to range secondary to pain.  Right knee with some tenderness to the anterior aspect, also unable to range this knee secondary to pain.  No obvious deformity.  Not significantly swollen.   Moving toes.  Normal sensation distally.  Strong dorsalis pedis pulses.  Psychiatric: She has a normal mood and affect. Her behavior is normal.  Nursing note and vitals reviewed.    ED Treatments / Results  Labs (all labs ordered are listed, but only abnormal results are displayed) Labs Reviewed - No data to display  EKG None  Radiology Dg Knee Complete 4 Views Left  Result Date: 12/10/2017 CLINICAL DATA:  Left knee injury due to a trip and fall over dogs last night. Initial encounter. EXAM: LEFT KNEE - COMPLETE 4+ VIEW COMPARISON:  None. FINDINGS: No evidence of fracture, dislocation, or joint effusion. No evidence of arthropathy or other focal bone abnormality. Soft tissues are unremarkable. IMPRESSION: Negative exam. Electronically Signed   By: Drusilla Kanner M.D.   On: 12/10/2017 15:56   Dg Knee Complete 4 Views Right  Result Date: 12/10/2017 CLINICAL DATA:  Right knee injury due  to a trip and fall over dogs last night. Initial encounter. EXAM: RIGHT KNEE - COMPLETE 4+ VIEW COMPARISON:  None. FINDINGS: No evidence of fracture, dislocation, or joint effusion. No evidence of arthropathy or other focal bone abnormality. Soft tissues are unremarkable. IMPRESSION: Negative exam. Electronically Signed   By: Drusilla Kanner M.D.   On: 12/10/2017 15:58    Procedures Procedures (including critical care time)  Medications Ordered in ED Medications  oxyCODONE-acetaminophen (PERCOCET/ROXICET) 5-325 MG per tablet 1 tablet (1 tablet Oral Given 12/10/17 1507)     Initial  Impression / Assessment and Plan / ED Course  I have reviewed the triage vital signs and the nursing notes.  Pertinent labs & imaging results that were available during my care of the patient were reviewed by me and considered in my medical decision making (see chart for details).  Clinical Course as of Dec 10 1625  Sat Dec 10, 2017  1618 Patient reevaluated and reports improvement in symptoms after pain medication.  Knee exam with improved tenderness though still has pain with palpation and range of motion.  Patient discussed with and evaluated by Dr. Pilar Plate.  Will apply a knee immobilizer brace and refer to orthopedics for follow-up.   [JR]    Clinical Course User Index [JR] , [place] N, PA-C    Patient with pain to b/l knees after mechanical fall yesterday.  Patient states she fell forward onto of her knees.  Did not feel anything move out of place in either knees with the fall.  Woke up this morning with worsening pain and swelling, left worse than right.  Tenderness on exam and unable to range left knee secondary to pain.  X-rays are negative.  Neurovascularly intact.  On reevaluation after pain medication, pain is improved though still tender.  Right knee with improved range of motion.  Will apply knee immobilizer and trial crutches for discharge with orthopedic referral for follow-up.  NSAIDs, RICE  therapy. Strict return precautions.  Safe for discharge.  Discussed results, findings, treatment and follow up. Patient advised of return precautions. Patient verbalized understanding and agreed with plan.  Final Clinical Impressions(s) / ED Diagnoses   Final diagnoses:  Left knee injury, initial encounter  Contusion of right knee, initial encounter    ED Discharge Orders         Ordered    naproxen (NAPROSYN) 500 MG tablet  2 times daily with meals     12/10/17 1627           , [place] N, PA-C 12/10/17 1627    , [place] N, PA-C 12/11/17 0910    Sabas Sous, MD 12/11/17 2333

## 2017-12-10 NOTE — ED Notes (Signed)
Family at bedside. 

## 2017-12-10 NOTE — Discharge Instructions (Addendum)
Please read instructions below. Apply ice to your knees for 20 minutes at a time.  Elevate your legs is much as possible. Wear the brace at all times.  Do not bear weight on your left leg until you have been evaluated by the orthopedic specialist. You can take naproxen every 12 hours WITH MEALS as needed for pain.  You can take Tylenol in addition to this medication for added pain relief. Schedule an appointment with the orthopedic specialist in 1 week for repeat x-ray and follow-up on your injury. Return to the ER for significantly worsening pain, numbness in your foot, or new or concerning symptoms.

## 2018-08-26 ENCOUNTER — Encounter (HOSPITAL_COMMUNITY): Payer: Self-pay

## 2018-08-26 ENCOUNTER — Emergency Department (HOSPITAL_COMMUNITY)
Admission: EM | Admit: 2018-08-26 | Discharge: 2018-08-26 | Disposition: A | Discharge status: Home / Self Care | Payer: Medicaid Other | Attending: Emergency Medicine | Admitting: Emergency Medicine

## 2018-08-26 DIAGNOSIS — Z79899 Other long term (current) drug therapy: Secondary | ICD-10-CM | POA: Diagnosis not present

## 2018-08-26 DIAGNOSIS — Z7982 Long term (current) use of aspirin: Secondary | ICD-10-CM | POA: Diagnosis not present

## 2018-08-26 DIAGNOSIS — I10 Essential (primary) hypertension: Secondary | ICD-10-CM | POA: Insufficient documentation

## 2018-08-26 DIAGNOSIS — Z87891 Personal history of nicotine dependence: Secondary | ICD-10-CM | POA: Insufficient documentation

## 2018-08-26 DIAGNOSIS — H00011 Hordeolum externum right upper eyelid: Secondary | ICD-10-CM | POA: Insufficient documentation

## 2018-08-26 DIAGNOSIS — H5711 Ocular pain, right eye: Secondary | ICD-10-CM | POA: Diagnosis present

## 2018-08-26 DIAGNOSIS — H1031 Unspecified acute conjunctivitis, right eye: Secondary | ICD-10-CM | POA: Diagnosis not present

## 2018-08-26 DIAGNOSIS — H109 Unspecified conjunctivitis: Secondary | ICD-10-CM

## 2018-08-26 MED ORDER — ERYTHROMYCIN 5 MG/GM OP OINT: TOPICAL_OINTMENT | OPHTHALMIC | 0 refills | Status: DC

## 2018-08-26 MED ORDER — TETRACAINE HCL 0.5 % OP SOLN
2.0000 [drp] | Freq: Once | OPHTHALMIC | Status: AC
  Administered 2018-08-26: 2 [drp] via OPHTHALMIC
  Filled 2018-08-26: qty 4

## 2018-08-26 MED ORDER — FLUORESCEIN SODIUM 1 MG OP STRP
1.0000 | ORAL_STRIP | Freq: Once | OPHTHALMIC | Status: AC
  Administered 2018-08-26: 1 via OPHTHALMIC
  Filled 2018-08-26: qty 1

## 2018-08-26 NOTE — Discharge Instructions (Signed)
Use antibiotic ointment as directed.  As we discussed, apply warm compresses to the eye to help reduce the stye.  Return the emergency department for any redness or swelling surrounding the eye, fevers or any other worsening concerning symptoms.  Follow-up with referred ophthalmology doctor if your symptoms do not improve or get worse.

## 2018-08-26 NOTE — ED Triage Notes (Addendum)
Pt from home w/ a c/o right eye pain and swelling that has been ongoing for 2-3 days. Pt reports spraying her face with a product that minimizes make up from running while sweating. She believes she may have gotten some of this product in her eye. Since then her eyelid has formed a "knot" and is swollen. Redness noted to the sclera of her eye just right of her iris. She has noted a clear drainage noted oozing from her eye. She reports a crusty film that develops while sleeping. The site itches.

## 2018-08-26 NOTE — ED Provider Notes (Signed)
[facility]   CSN: [id number] Arrival date & time: 08/26/18  1520    History   Chief Complaint Chief Complaint  Patient presents with  . Eye Pain    HPI [name] is a 38 y.o. female who presents for evaluation of right eye pain, redness, irritation x4 days as well as some upper eyelid swelling.  She states that symptoms began 4 days ago after using a make-up remover that may have gotten in her eye.  She states that since then, she has noticed redness and irritation to the eye.  She states she also noted that her upper eyelid had started swelling and that she felt a bump there.  She denies any trauma to the eye.  She does wear glasses and contacts but has not been wearing those since the incident happened.  Patient states she has not noted any periorbital warmth, erythema, edema.  She has not noted any fevers.  She states she has not had any associated blurry vision.  She has had some clear drainage noted from the eye.  She also reports that she started having some crusting around the eye when she woke up in the morning.      The history is provided by the patient.    Past Medical History:  Diagnosis Date  . Acid reflux   . Alcohol abuse   . Anemia   . Depression   . GERD (gastroesophageal reflux disease)   . H/O suicide attempt   . Hypertension   . Vaginal Pap smear, abnormal     Patient Active Problem List   Diagnosis Date Noted  . Heart abnormality   . History of preterm delivery   . History of suicide attempt 10/23/2013  . Substance use disorder 10/23/2013  . History of depression 10/23/2013  . Abnormal Pap smear of cervix 10/23/2013  . Hypokalemia 08/08/2013  . Essential hypertension, benign 08/08/2013  . History of alcohol use 08/08/2013  . H/O alcoholic gastritis 08/08/2013    Past Surgical History:  Procedure Laterality Date  . LEEP       OB History    Gravida  5   Para  3   Term  3   Preterm  0   AB  2   Living  2     SAB  2   TAB      Ectopic      Multiple  0   Live Births  2            Home Medications    Prior to Admission medications   Medication Sig Start Date End Date Taking? Authorizing Provider  aspirin 81 MG chewable tablet Chew 81 mg by mouth daily.    [provider]  erythromycin ophthalmic ointment Place a 1/2 inch ribbon of ointment into the lower eyelid qid x 7 days 08/26/18   Graciella FreerLayden, Roma Bierlein A, PA-C  flintstones complete (FLINTSTONES) 60 MG chewable tablet Chew 2 tablets by mouth daily.    [provider]  ibuprofen (ADVIL,MOTRIN) 600 MG tablet Take 1 tablet (600 mg total) by mouth every 8 (eight) hours as needed. 04/30/15   Azalia Bilisampos, Kevin, MD  methocarbamol (ROBAXIN) 500 MG tablet Take 1 tablet (500 mg total) by mouth every 8 (eight) hours as needed for muscle spasms. 04/30/15   Azalia Bilisampos, Kevin, MD  naproxen (NAPROSYN) 500 MG tablet Take 1 tablet (500 mg total) by mouth 2 (two) times daily with a meal.  12/10/17   Robinson, [place]azil[place] N, PA-C  norgestimate-ethinyl estradiol (ORTHO-CYCLEN,SPRINTEC,PREVIFEM) 0.25-35 MG-MCG tablet Take 1 tablet by mouth daily. 05/22/14   Anyanwu, Jethro BastosUgonna A, MD  oxyCODONE-acetaminophen (PERCOCET/ROXICET) 5-325 MG tablet Take 1 tablet by mouth every 6 (six) hours as needed for severe pain. 05/25/17   Hedges, Tinnie GensJeffrey, PA-C  potassium chloride (KLOR-CON) 20 MEQ packet Take 20 mEq by mouth 2 (two) times daily.    [provider]  ranitidine (ZANTAC) 150 MG tablet Take 150 mg by mouth 2 (two) times daily.     [provider]  sertraline (ZOLOFT) 100 MG tablet Take 1 tablet (100 mg total) by mouth daily. 04/19/14   Adam PhenixArnold, James G, MD    Family History Family History  Problem Relation Age of Onset  . Heart disease Mother   . Hyperlipidemia Mother   . Hypertension Mother   . Stroke Mother   . Asthma Sister   . Asthma Son     Social History Social History   Tobacco Use  . Smoking  status: Former Games developermoker  . Smokeless tobacco: Never Used  Substance Use Topics  . Alcohol use: Yes    Comment: Hx alcohol abuse; occassionally  . Drug use: No    Types: Marijuana    Comment: occasionally     Allergies   Patient has no known allergies.   Review of Systems Review of Systems  Constitutional: Negative for fever.  Eyes: Positive for pain, discharge, redness and itching. Negative for visual disturbance.  All other systems reviewed and are negative.    Physical Exam Updated Vital Signs BP 113/88 (BP Location: Right Arm)   Pulse 90   Temp 98.2 F (36.8 C) (Oral)   Resp 15   SpO2 98%   Physical Exam Vitals signs and nursing note reviewed.  Constitutional:      Appearance: She is well-developed.  HENT:     Head: Normocephalic and atraumatic.  Eyes:     General: No scleral icterus.       Right eye: Discharge present.        Left eye: No discharge.     Extraocular Movements: Extraocular movements intact.     Conjunctiva/sclera: Conjunctivae normal.     Comments: PERRL.  EOMs intact with any difficulty.  Conjunctival injection noted the lateral aspect of the right eye.  Right upper eyelid with stye noted.  Clear discharge noted. No hyphema noted.   Pulmonary:     Effort: Pulmonary effort is normal.  Skin:    General: Skin is warm and dry.  Neurological:     Mental Status: She is alert.  Psychiatric:        Speech: Speech normal.        Behavior: Behavior normal.      ED Treatments / Results  Labs (all labs ordered are listed, but only abnormal results are displayed) Labs Reviewed - No data to display  EKG None  Radiology No results found.  Procedures Procedures (including critical care time)  Medications Ordered in ED Medications  tetracaine (PONTOCAINE) 0.5 % ophthalmic solution 2 drop (2 drops Both Eyes Given 08/26/18 1610)  fluorescein ophthalmic strip 1 strip (1 strip Both Eyes Given 08/26/18 1611)     Initial Impression / Assessment  and Plan / ED Course  I have reviewed the triage vital signs and the nursing notes.  Pertinent labs & imaging results that were available during my care of the patient were reviewed by me and considered in my medical decision  making (see chart for details).        38 year old female who presents for evaluation of right eye irritation x4 days.  Associated with upper eyelid swelling.  No fevers, blurry vision.  Also states that she has now noticed some crusting to the eye when waking up.  She does wear glasses and contacts but has not been wearing them since this happened. Patient is afebrile, non-toxic appearing, sitting comfortably on examination table. Vital signs reviewed and stable.  Concern for conjunctivitis versus stye.  History/physical exam concerning for orbital or preseptal cellulitis.  Will evaluate with Woods lamp, slit-lamp for evaluation of any possible corneal abrasion.  Woods lamp shows no evidence of fluorescein uptake, evidence of corneal abrasion. Slit lamp evaluation shows no evidence of cells or flares.  Evaluation of pH showed both eyes to be 7.  Intraocular pressure as documented below:  Left IOP: 17, 22, 18 Right IOP: 17, 19, 17     Visual Acuity  Right Eye Distance: 20/25 Left Eye Distance: 20/32 Bilateral Distance:   At this time, suspect that her symptoms are related due to conjunctival irritation versus conjunctivitis.  Additionally, she has a stye noted on the upper eyelid.  We will plan to give antibiotic ointment and encourage at home supportive care measures.  Patient instructed to follow-up with ophthalmology for further evaluation if symptoms do not improve. At this time, patient exhibits no emergent life-threatening condition that require further evaluation in ED or admission. Patient had ample opportunity for questions and discussion. All patient's questions were answered with full understanding. Strict return precautions discussed. Patient expresses  understanding and agreement to plan.   Portions of this note were generated with Lobbyist. Dictation errors may occur despite best attempts at proofreading.   Final Clinical Impressions(s) / ED Diagnoses   Final diagnoses:  Hordeolum externum of right upper eyelid  Conjunctivitis of right eye, unspecified conjunctivitis type    ED Discharge Orders         Ordered    erythromycin ophthalmic ointment     08/26/18 1641           Volanda Napoleon, PA-C 08/26/18 1742    Gareth Morgan, MD 08/28/18 [phone]

## 2019-01-16 ENCOUNTER — Other Ambulatory Visit: Payer: Self-pay

## 2019-01-16 DIAGNOSIS — Z20822 Contact with and (suspected) exposure to covid-19: Secondary | ICD-10-CM

## 2019-01-17 LAB — NOVEL CORONAVIRUS, NAA: SARS-CoV-2, NAA: NOT DETECTED

## 2019-04-11 ENCOUNTER — Emergency Department (HOSPITAL_COMMUNITY)
Admission: EM | Admit: 2019-04-11 | Discharge: 2019-04-11 | Disposition: A | Discharge status: Home / Self Care | Payer: Medicaid Other | Attending: Emergency Medicine | Admitting: Emergency Medicine

## 2019-04-11 ENCOUNTER — Emergency Department (HOSPITAL_COMMUNITY): Payer: Medicaid Other

## 2019-04-11 ENCOUNTER — Encounter (HOSPITAL_COMMUNITY): Payer: Self-pay | Admitting: Emergency Medicine

## 2019-04-11 ENCOUNTER — Other Ambulatory Visit: Payer: Self-pay

## 2019-04-11 DIAGNOSIS — K219 Gastro-esophageal reflux disease without esophagitis: Secondary | ICD-10-CM | POA: Insufficient documentation

## 2019-04-11 DIAGNOSIS — R112 Nausea with vomiting, unspecified: Secondary | ICD-10-CM | POA: Insufficient documentation

## 2019-04-11 DIAGNOSIS — Z79899 Other long term (current) drug therapy: Secondary | ICD-10-CM | POA: Diagnosis not present

## 2019-04-11 DIAGNOSIS — R1084 Generalized abdominal pain: Secondary | ICD-10-CM | POA: Insufficient documentation

## 2019-04-11 DIAGNOSIS — I10 Essential (primary) hypertension: Secondary | ICD-10-CM | POA: Insufficient documentation

## 2019-04-11 DIAGNOSIS — K529 Noninfective gastroenteritis and colitis, unspecified: Secondary | ICD-10-CM

## 2019-04-11 DIAGNOSIS — R1114 Bilious vomiting: Secondary | ICD-10-CM

## 2019-04-11 LAB — COMPREHENSIVE METABOLIC PANEL
ALT: 22 U/L (ref 0–44)
AST: 24 U/L (ref 15–41)
Albumin: 4 g/dL (ref 3.5–5.0)
Alkaline Phosphatase: 82 U/L (ref 38–126)
Anion gap: 15 (ref 5–15)
BUN: 11 mg/dL (ref 6–20)
CO2: 25 mmol/L (ref 22–32)
Calcium: 10 mg/dL (ref 8.9–10.3)
Chloride: 101 mmol/L (ref 98–111)
Creatinine, Ser: 1.22 mg/dL — ABNORMAL HIGH (ref 0.44–1.00)
GFR calc Af Amer: >60 mL/min (ref >60)
GFR calc non Af Amer: 57 mL/min — ABNORMAL LOW (ref >60)
Glucose, Bld: 133 mg/dL — ABNORMAL HIGH (ref 70–99)
Potassium: 3.2 mmol/L — ABNORMAL LOW (ref 3.5–5.1)
Sodium: 141 mmol/L (ref 135–145)
Total Bilirubin: 0.8 mg/dL (ref 0.3–1.2)
Total Protein: 8.2 g/dL — ABNORMAL HIGH (ref 6.5–8.1)

## 2019-04-11 LAB — CBC
HCT: 41.4 % (ref 36.0–46.0)
Hemoglobin: 13.6 g/dL (ref 12.0–15.0)
MCH: 30.6 pg (ref 26.0–34.0)
MCHC: 32.9 g/dL (ref 30.0–36.0)
MCV: 93.2 fL (ref 80.0–100.0)
Platelets: 365 10*3/uL (ref 150–400)
RBC: 4.44 MIL/uL (ref 3.87–5.11)
RDW: 12.8 % (ref 11.5–15.5)
WBC: 10.3 10*3/uL (ref 4.0–10.5)
nRBC: 0 % (ref 0.0–0.2)

## 2019-04-11 LAB — URINALYSIS, ROUTINE W REFLEX MICROSCOPIC
Bilirubin Urine: NEGATIVE
Glucose, UA: NEGATIVE mg/dL
Hgb urine dipstick: NEGATIVE
Ketones, ur: 5 mg/dL — AB
Leukocytes,Ua: NEGATIVE
Nitrite: NEGATIVE
Protein, ur: 100 mg/dL — AB
Specific Gravity, Urine: 1.029 (ref 1.005–1.030)
pH: 6 (ref 5.0–8.0)

## 2019-04-11 LAB — I-STAT BETA HCG BLOOD, ED (MC, WL, AP ONLY): I-stat hCG, quantitative: <5 m[IU]/mL (ref <5)

## 2019-04-11 LAB — LIPASE, BLOOD: Lipase: 24 U/L (ref 11–51)

## 2019-04-11 MED ORDER — HYDROCODONE-ACETAMINOPHEN 5-325 MG PO TABS: 1.0000 | ORAL_TABLET | ORAL | 0 refills | Status: DC | PRN

## 2019-04-11 MED ORDER — PROCHLORPERAZINE EDISYLATE 10 MG/2ML IJ SOLN
10.0000 mg | Freq: Once | INTRAMUSCULAR | Status: AC
  Administered 2019-04-11: 18:00:00 10 mg via INTRAVENOUS
  Filled 2019-04-11: qty 2

## 2019-04-11 MED ORDER — HALOPERIDOL LACTATE 5 MG/ML IJ SOLN
2.0000 mg | Freq: Once | INTRAMUSCULAR | Status: DC
  Filled 2019-04-11: qty 1

## 2019-04-11 MED ORDER — SODIUM CHLORIDE 0.9% FLUSH: 3.0000 mL | Freq: Once | INTRAVENOUS | Status: DC

## 2019-04-11 MED ORDER — LORAZEPAM 2 MG/ML IJ SOLN
0.5000 mg | Freq: Once | INTRAMUSCULAR | Status: AC
  Administered 2019-04-11: 12:00:00 0.5 mg via INTRAVENOUS
  Filled 2019-04-11: qty 1

## 2019-04-11 MED ORDER — ONDANSETRON 4 MG PO TBDP
4.0000 mg | ORAL_TABLET | Freq: Once | ORAL | Status: AC
  Administered 2019-04-11: 08:00:00 4 mg via ORAL
  Filled 2019-04-11: qty 1

## 2019-04-11 MED ORDER — ALUM & MAG HYDROXIDE-SIMETH 200-200-20 MG/5ML PO SUSP: 30.0000 mL | Freq: Once | ORAL | Status: DC

## 2019-04-11 MED ORDER — FENTANYL CITRATE (PF) 100 MCG/2ML IJ SOLN
25.0000 ug | Freq: Once | INTRAMUSCULAR | Status: AC
  Administered 2019-04-11: 09:00:00 25 ug via INTRAVENOUS
  Filled 2019-04-11: qty 2

## 2019-04-11 MED ORDER — ONDANSETRON HCL 4 MG PO TABS: 4.0000 mg | ORAL_TABLET | Freq: Four times a day (QID) | ORAL | 0 refills | Status: DC | PRN

## 2019-04-11 MED ORDER — PROMETHAZINE HCL 25 MG/ML IJ SOLN
25.0000 mg | Freq: Once | INTRAMUSCULAR | Status: AC
  Administered 2019-04-11: 25 mg via INTRAVENOUS
  Filled 2019-04-11: qty 1

## 2019-04-11 MED ORDER — IOHEXOL 300 MG/ML  SOLN
100.0000 mL | Freq: Once | INTRAMUSCULAR | Status: AC | PRN
  Administered 2019-04-11: 20:00:00 100 mL via INTRAVENOUS

## 2019-04-11 MED ORDER — METRONIDAZOLE 500 MG PO TABS: 500.0000 mg | ORAL_TABLET | Freq: Two times a day (BID) | ORAL | 0 refills | Status: DC

## 2019-04-11 MED ORDER — SODIUM CHLORIDE 0.9 % IV BOLUS
1000.0000 mL | Freq: Once | INTRAVENOUS | Status: AC
  Administered 2019-04-11: 09:00:00 1000 mL via INTRAVENOUS

## 2019-04-11 MED ORDER — ONDANSETRON HCL 4 MG/2ML IJ SOLN
4.0000 mg | Freq: Once | INTRAMUSCULAR | Status: AC | PRN
  Administered 2019-04-11: 4 mg via INTRAVENOUS
  Filled 2019-04-11 (×2): qty 2

## 2019-04-11 MED ORDER — CIPROFLOXACIN HCL 500 MG PO TABS: 500.0000 mg | ORAL_TABLET | Freq: Two times a day (BID) | ORAL | 0 refills | Status: DC

## 2019-04-11 MED ORDER — FAMOTIDINE IN NACL 20-0.9 MG/50ML-% IV SOLN
20.0000 mg | Freq: Once | INTRAVENOUS | Status: AC
  Administered 2019-04-11: 09:00:00 20 mg via INTRAVENOUS
  Filled 2019-04-11: qty 50

## 2019-04-11 NOTE — ED Notes (Signed)
Pt returned from CT.  Pt st's she is feeling some better

## 2019-04-11 NOTE — ED Notes (Signed)
Pt to CT at this time.

## 2019-04-11 NOTE — ED Notes (Signed)
Continues to have nausea and vomiting after any movement  Dr. Jeraldine Loots made aware

## 2019-04-11 NOTE — ED Notes (Signed)
Attempted IV start x 1 without success-- states has had to have IV team with ulrtrasound in past.

## 2019-04-11 NOTE — ED Notes (Signed)
Pt continues to have nausea and dry heaves after compazine given.  IV consult placed for larger IV for CT

## 2019-04-11 NOTE — ED Triage Notes (Signed)
Patient arrived with EMS from home reports persistent emesis since yesterday , no diarrhea or fever , denies abdominal pain or urinary discomfort.

## 2019-04-11 NOTE — ED Provider Notes (Signed)
[facility] EMERGENCY DEPARTMENT Provider Note   CSN: [id number] Arrival date & time: 04/11/19  0109     History Chief Complaint  Patient presents with  . Emesis    [name] is a 39 y.o. female.  HPI    Patient presents with abdominal pain, nausea, vomiting.  Patient is a history of GERD, was previously on Zantac, currently taking omeprazole.  This illness began yesterday, since that time she has been persistently nauseous, with multiple episodes of vomiting, and abdominal pain. Pain is in several areas, inconsistent, primarily in the upper abdomen, but on repeat questioning occasionally in the lower abdomen. Pain is severe, sore, and she is intolerant of medication for relief. She denies fever, but states that EMS providers told her she was febrile in route. Last menstrual period was about 1 month ago. There is associated oliguria.  Past Medical History:  Diagnosis Date  . Acid reflux   . Alcohol abuse   . Anemia   . Depression   . GERD (gastroesophageal reflux disease)   . H/O suicide attempt   . Hypertension   . Vaginal Pap smear, abnormal     Patient Active Problem List   Diagnosis Date Noted  . Heart abnormality   . History of preterm delivery   . History of suicide attempt 10/23/2013  . Substance use disorder 10/23/2013  . History of depression 10/23/2013  . Abnormal Pap smear of cervix 10/23/2013  . Hypokalemia 08/08/2013  . Essential hypertension, benign 08/08/2013  . History of alcohol use 08/08/2013  . H/O alcoholic gastritis 08/08/2013    Past Surgical History:  Procedure Laterality Date  . LEEP       OB History    Gravida  5   Para  3   Term  3   Preterm  0   AB  2   Living  2     SAB  2   TAB      Ectopic      Multiple  0   Live Births  2           Family History  Problem Relation Age of Onset  . Heart disease Mother   . Hyperlipidemia Mother   . Hypertension Mother   . Stroke Mother   .  Asthma Sister   . Asthma Son     Social History   Tobacco Use  . Smoking status: Former Games developer  . Smokeless tobacco: Never Used  Substance Use Topics  . Alcohol use: Yes    Comment: Hx alcohol abuse; occassionally  . Drug use: No    Types: Marijuana    Comment: occasionally    Home Medications Prior to Admission medications   Medication Sig Start Date End Date Taking? Authorizing Provider  aspirin 81 MG chewable tablet Chew 81 mg by mouth daily.    [provider]  erythromycin ophthalmic ointment Place a 1/2 inch ribbon of ointment into the lower eyelid qid x 7 days 08/26/18   Graciella Freer A, PA-C  flintstones complete (FLINTSTONES) 60 MG chewable tablet Chew 2 tablets by mouth daily.    [provider]  ibuprofen (ADVIL,MOTRIN) 600 MG tablet Take 1 tablet (600 mg total) by mouth every 8 (eight) hours as needed. 04/30/15   Azalia Bilis, MD  methocarbamol (ROBAXIN) 500 MG tablet Take 1 tablet (500 mg total) by mouth every 8 (eight) hours as needed for muscle spasms. 04/30/15   Azalia Bilis, MD  naproxen (NAPROSYN)  500 MG tablet Take 1 tablet (500 mg total) by mouth 2 (two) times daily with a meal. 12/10/17   Robinson, [place] N, PA-C  norgestimate-ethinyl estradiol (ORTHO-CYCLEN,SPRINTEC,PREVIFEM) 0.25-35 MG-MCG tablet Take 1 tablet by mouth daily. 05/22/14   Anyanwu, Jethro Bastos, MD  oxyCODONE-acetaminophen (PERCOCET/ROXICET) 5-325 MG tablet Take 1 tablet by mouth every 6 (six) hours as needed for severe pain. 05/25/17   Hedges, Tinnie Gens, PA-C  potassium chloride (KLOR-CON) 20 MEQ packet Take 20 mEq by mouth 2 (two) times daily.    [provider]  ranitidine (ZANTAC) 150 MG tablet Take 150 mg by mouth 2 (two) times daily.     [provider]  sertraline (ZOLOFT) 100 MG tablet Take 1 tablet (100 mg total) by mouth daily. 04/19/14   Adam Phenix, MD    Allergies    Patient has no known allergies.  Review of Systems   Review of Systems   Constitutional:       Per HPI, otherwise negative  HENT:       Per HPI, otherwise negative  Respiratory:       Per HPI, otherwise negative  Cardiovascular:       Per HPI, otherwise negative  Gastrointestinal: Positive for abdominal pain, nausea and vomiting.  Endocrine:       Negative aside from HPI  Genitourinary:       Neg aside from HPI   Musculoskeletal:       Per HPI, otherwise negative  Skin: Negative.   Neurological: Negative for syncope.    Physical Exam Updated Vital Signs BP (!) 188/113   Pulse 77   Temp 98.7 F (37.1 C) (Oral)   Resp (!) 25   LMP 03/23/2019 (Approximate)   SpO2 99%   Physical Exam Vitals and nursing note reviewed.  Constitutional:      General: She is not in acute distress.    Appearance: She is well-developed.  HENT:     Head: Normocephalic and atraumatic.  Eyes:     Conjunctiva/sclera: Conjunctivae normal.  Cardiovascular:     Rate and Rhythm: Normal rate and regular rhythm.  Pulmonary:     Effort: Pulmonary effort is normal. No respiratory distress.     Breath sounds: Normal breath sounds. No stridor.  Abdominal:     General: There is no distension.     Tenderness: There is abdominal tenderness. Positive signs include Murphy's sign. Negative signs include McBurney's sign.     Comments: No tenderness along the lateral edges of the abdomen, no peritonitis, but the patient does have tenderness in the epigastrium, right upper quadrant with guarding. No lower abdominal pain though she does describe this as being uncomfortable during history.  Skin:    General: Skin is warm and dry.  Neurological:     Mental Status: She is alert and oriented to person, place, and time.     Cranial Nerves: No cranial nerve deficit.     ED Results / Procedures / Treatments   Labs (all labs ordered are listed, but only abnormal results are displayed) Labs Reviewed  COMPREHENSIVE METABOLIC PANEL - Abnormal; Notable for the following components:       Result Value   Potassium 3.2 (*)    Glucose, Bld 133 (*)    Creatinine, Ser 1.22 (*)    Total Protein 8.2 (*)    GFR calc non Af Amer 57 (*)    All other components within normal limits  URINALYSIS, ROUTINE W REFLEX MICROSCOPIC - Abnormal; Notable for  the following components:   Color, Urine AMBER (*)    APPearance CLOUDY (*)    Ketones, ur 5 (*)    Protein, ur 100 (*)    Bacteria, UA RARE (*)    All other components within normal limits  LIPASE, BLOOD  CBC  I-STAT BETA HCG BLOOD, ED ([facility], [facility], [facility] ONLY)  I-STAT BETA HCG BLOOD, ED ([facility], [facility], [facility] ONLY)    EKG EKG Interpretation  Date/Time:  Wednesday April 11 2019 09:11:46 EST Ventricular Rate:  91 PR Interval:    QRS Duration: 87 QT Interval:  387 QTC Calculation: 477 R Axis:   30 Text Interpretation: Sinus rhythm Borderline T abnormalities, anterior leads T wave abnormality Abnormal ECG Confirmed by Gerhard Munch [phone]) on 04/11/2019 9:59:59 AM   Radiology US Abdomen Complete  Result Date: 04/11/2019 CLINICAL DATA:  Abdominal pain EXAM: ABDOMEN ULTRASOUND COMPLETE COMPARISON:  05/29/2009 FINDINGS: Gallbladder: No gallstones or wall thickening visualized. No sonographic Murphy sign noted by sonographer. Common bile duct: Diameter: 3.4 mm Liver: No focal lesion identified. Within normal limits in parenchymal echogenicity. Portal vein is patent on color Doppler imaging with normal direction of blood flow towards the liver. IVC: No abnormality visualized. Pancreas: Visualized portion unremarkable. Spleen: Size and appearance within normal limits. Right Kidney: Length: 11.6 cm. Echogenicity within normal limits. No mass or hydronephrosis visualized. Left Kidney: Length: 10.9 cm. Echogenicity within normal limits. No mass or hydronephrosis visualized. Abdominal aorta: No aneurysm visualized. Other findings: None. IMPRESSION: Unremarkable abdominal ultrasound. Electronically Signed   By: Duanne Guess D.O.   On: 04/11/2019 10:14   DG  Chest Port 1 View  Result Date: 04/11/2019 CLINICAL DATA:  Chest pain EXAM: PORTABLE CHEST 1 VIEW COMPARISON:  None. FINDINGS: Mild cardiomegaly. Both lungs are clear. The visualized skeletal structures are unremarkable. IMPRESSION: Mild cardiomegaly without acute abnormality of the lungs in AP portable projection. Electronically Signed   By: Lauralyn Primes M.D.   On: 04/11/2019 08:42    Procedures Procedures (including critical care time)  Medications Ordered in ED Medications  sodium chloride flush (NS) 0.9 % injection 3 mL (has no administration in time range)  sodium chloride flush (NS) 0.9 % injection 3 mL (3 mLs Intravenous Not Given 04/11/19 0904)  alum & mag hydroxide-simeth (MAALOX/MYLANTA) 200-200-20 MG/5ML suspension 30 mL (30 mLs Oral Not Given 04/11/19 0903)  ondansetron (ZOFRAN) injection 4 mg (4 mg Intravenous Given 04/11/19 0855)  ondansetron (ZOFRAN-ODT) disintegrating tablet 4 mg (4 mg Oral Given 04/11/19 0746)  sodium chloride 0.9 % bolus 1,000 mL (0 mLs Intravenous Stopped 04/11/19 1100)  famotidine (PEPCID) IVPB 20 mg premix (0 mg Intravenous Stopped 04/11/19 0956)  fentaNYL (SUBLIMAZE) injection 25 mcg (25 mcg Intravenous Given 04/11/19 0855)  promethazine (PHENERGAN) injection 25 mg (25 mg Intravenous Given 04/11/19 0957)  LORazepam (ATIVAN) injection 0.5 mg (0.5 mg Intravenous Given 04/11/19 1217)    ED Course  I have reviewed the triage vital signs and the nursing notes.  Pertinent labs & imaging results that were available during my care of the patient were reviewed by me and considered in my medical decision making (see chart for details).   After the initial evaluation with consideration of gastritis versus hepatobiliary dysfunction versus other intra-abdominal processes patient had labs, ultrasound. Fluids, antiemetics, analgesia ordered.  Monitor for ultrasound patient continued to have nausea, now with hiccups as well. Additional meds pending.  With persistent nausea and  pain, CT scan pending.  3:17 PM Continues to complain of nausea, though she  has been ambulatory, without distress. CT scan pending.  This adult female with history of GERD now presents with abdominal pain in an unusual distribution. Patient has a nonperitoneal abdomen, though with tenderness in the upper and lower belly. Patient has no reproductive organ complaints, was not menstruating. Patient's ultrasound reassuring, initial labs generally reassuring, but with consideration of persistent nausea, vomiting, intra-abdominal infection, CT scan is pending. Dr. Wilson Singer is aware of the patient and will dispo appropriately.   Final Clinical Impression(s) / ED Diagnoses Final diagnoses:  Generalized abdominal pain  Bilious vomiting with nausea     Carmin Muskrat, MD 04/11/19 [phone]

## 2019-04-11 NOTE — ED Notes (Signed)
Started vomiting again-- Ativan given

## 2019-04-11 NOTE — ED Notes (Signed)
Pt resting at this time with eyes closed 

## 2019-04-13 ENCOUNTER — Other Ambulatory Visit: Payer: Self-pay

## 2019-04-13 ENCOUNTER — Emergency Department (HOSPITAL_COMMUNITY)
Admission: EM | Admit: 2019-04-13 | Discharge: 2019-04-14 | Disposition: A | Discharge status: Home / Self Care | Payer: Medicaid Other | Attending: Emergency Medicine | Admitting: Emergency Medicine

## 2019-04-13 DIAGNOSIS — Z79899 Other long term (current) drug therapy: Secondary | ICD-10-CM | POA: Insufficient documentation

## 2019-04-13 DIAGNOSIS — E876 Hypokalemia: Secondary | ICD-10-CM

## 2019-04-13 DIAGNOSIS — I10 Essential (primary) hypertension: Secondary | ICD-10-CM | POA: Diagnosis not present

## 2019-04-13 DIAGNOSIS — Z87891 Personal history of nicotine dependence: Secondary | ICD-10-CM | POA: Insufficient documentation

## 2019-04-13 DIAGNOSIS — K529 Noninfective gastroenteritis and colitis, unspecified: Secondary | ICD-10-CM | POA: Insufficient documentation

## 2019-04-13 DIAGNOSIS — R1110 Vomiting, unspecified: Secondary | ICD-10-CM | POA: Diagnosis present

## 2019-04-13 LAB — CBC
HCT: 41.9 % (ref 36.0–46.0)
Hemoglobin: 13.8 g/dL (ref 12.0–15.0)
MCH: 30.6 pg (ref 26.0–34.0)
MCHC: 32.9 g/dL (ref 30.0–36.0)
MCV: 92.9 fL (ref 80.0–100.0)
Platelets: 325 10*3/uL (ref 150–400)
RBC: 4.51 MIL/uL (ref 3.87–5.11)
RDW: 12.6 % (ref 11.5–15.5)
WBC: 11.9 10*3/uL — ABNORMAL HIGH (ref 4.0–10.5)
nRBC: 0 % (ref 0.0–0.2)

## 2019-04-13 LAB — COMPREHENSIVE METABOLIC PANEL
ALT: 57 U/L — ABNORMAL HIGH (ref 0–44)
AST: 30 U/L (ref 15–41)
Albumin: 4.1 g/dL (ref 3.5–5.0)
Alkaline Phosphatase: 80 U/L (ref 38–126)
Anion gap: 17 — ABNORMAL HIGH (ref 5–15)
BUN: 13 mg/dL (ref 6–20)
CO2: 23 mmol/L (ref 22–32)
Calcium: 9.8 mg/dL (ref 8.9–10.3)
Chloride: 99 mmol/L (ref 98–111)
Creatinine, Ser: 1.07 mg/dL — ABNORMAL HIGH (ref 0.44–1.00)
GFR calc Af Amer: >60 mL/min (ref >60)
GFR calc non Af Amer: >60 mL/min (ref >60)
Glucose, Bld: 116 mg/dL — ABNORMAL HIGH (ref 70–99)
Potassium: 2.8 mmol/L — ABNORMAL LOW (ref 3.5–5.1)
Sodium: 139 mmol/L (ref 135–145)
Total Bilirubin: 1.4 mg/dL — ABNORMAL HIGH (ref 0.3–1.2)
Total Protein: 7.9 g/dL (ref 6.5–8.1)

## 2019-04-13 LAB — LIPASE, BLOOD: Lipase: 22 U/L (ref 11–51)

## 2019-04-13 NOTE — ED Triage Notes (Signed)
Pt states she was seen here for same 2 days ago. States unable to keep anything down. C/o chills, continued n/v, HA and burning in chest after vomiting.

## 2019-04-14 LAB — LACTIC ACID, PLASMA: Lactic Acid, Venous: 1 mmol/L (ref 0.5–1.9)

## 2019-04-14 LAB — I-STAT BETA HCG BLOOD, ED (MC, WL, AP ONLY): I-stat hCG, quantitative: <5 m[IU]/mL (ref <5)

## 2019-04-14 MED ORDER — POTASSIUM CHLORIDE 10 MEQ/100ML IV SOLN
10.0000 meq | Freq: Once | INTRAVENOUS | Status: AC
  Administered 2019-04-14: 02:00:00 10 meq via INTRAVENOUS
  Filled 2019-04-14: qty 100

## 2019-04-14 MED ORDER — ONDANSETRON 4 MG PO TBDP: 4.0000 mg | ORAL_TABLET | Freq: Three times a day (TID) | ORAL | 0 refills | Status: DC | PRN

## 2019-04-14 MED ORDER — SODIUM CHLORIDE 0.9 % IV BOLUS (SEPSIS)
1000.0000 mL | Freq: Once | INTRAVENOUS | Status: AC
  Administered 2019-04-14: 1000 mL via INTRAVENOUS

## 2019-04-14 MED ORDER — PROMETHAZINE HCL 25 MG/ML IJ SOLN
12.5000 mg | Freq: Once | INTRAMUSCULAR | Status: AC
  Administered 2019-04-14: 02:00:00 12.5 mg via INTRAVENOUS
  Filled 2019-04-14: qty 1

## 2019-04-14 MED ORDER — HYDROMORPHONE HCL 1 MG/ML IJ SOLN
0.5000 mg | Freq: Once | INTRAMUSCULAR | Status: AC
  Administered 2019-04-14: 02:00:00 0.5 mg via INTRAVENOUS
  Filled 2019-04-14: qty 1

## 2019-04-14 MED ORDER — SODIUM CHLORIDE 0.9 % IV SOLN
1000.0000 mL | INTRAVENOUS | Status: DC
  Administered 2019-04-14: 1000 mL via INTRAVENOUS

## 2019-04-14 MED ORDER — PROMETHAZINE HCL 25 MG PO TABS: 25.0000 mg | ORAL_TABLET | Freq: Four times a day (QID) | ORAL | 0 refills | Status: DC | PRN

## 2019-04-14 MED ORDER — ONDANSETRON 4 MG PO TBDP: 4.0000 mg | ORAL_TABLET | Freq: Three times a day (TID) | ORAL | 0 refills | Status: AC | PRN

## 2019-04-14 NOTE — ED Notes (Signed)
Attempted 2 times for IV dr is requesting ultrasound.

## 2019-04-14 NOTE — ED Provider Notes (Addendum)
[facility] EMERGENCY DEPARTMENT Provider Note  CSN: [id number] Arrival date & time: 04/13/19 1937  Chief Complaint(s) Emesis  HPI [name] is a 39 y.o. female seen 2 days ago for abdominal pain and diagnosed with colitis sent home on oral antibiotics presents to the emergency department with inability to tolerate oral intake.  Patient was sent home with Zofran pills.  States that she is unable to keep the medication down.  She is having associated abdominal pain.  She is having some chills.  Also complaining of generalized headache and chest burning with emesis.  Patient is having mild diarrhea.  No other physical complaints.  HPI  Past Medical History Past Medical History:  Diagnosis Date  . Acid reflux   . Alcohol abuse   . Anemia   . Depression   . GERD (gastroesophageal reflux disease)   . H/O suicide attempt   . Hypertension   . Vaginal Pap smear, abnormal    Patient Active Problem List   Diagnosis Date Noted  . Heart abnormality   . History of preterm delivery   . History of suicide attempt 10/23/2013  . Substance use disorder 10/23/2013  . History of depression 10/23/2013  . Abnormal Pap smear of cervix 10/23/2013  . Hypokalemia 08/08/2013  . Essential hypertension, benign 08/08/2013  . History of alcohol use 08/08/2013  . H/O alcoholic gastritis 08/08/2013   Home Medication(s) Prior to Admission medications   Medication Sig Start Date End Date Taking? Authorizing Provider  ciprofloxacin (CIPRO) 500 MG tablet Take 1 tablet (500 mg total) by mouth 2 (two) times daily. 04/11/19  Yes Raeford Razor, MD  HYDROcodone-acetaminophen (NORCO/VICODIN) 5-325 MG tablet Take 1 tablet by mouth every 4 (four) hours as needed for severe pain. 04/11/19  Yes Raeford Razor, MD  metroNIDAZOLE (FLAGYL) 500 MG tablet Take 1 tablet (500 mg total) by mouth 2 (two) times daily. 04/11/19  Yes Raeford Razor, MD  ondansetron (ZOFRAN) 4 MG tablet Take 1 tablet (4 mg  total) by mouth every 6 (six) hours as needed for nausea or vomiting. 04/11/19  Yes Raeford Razor, MD  erythromycin ophthalmic ointment Place a 1/2 inch ribbon of ointment into the lower eyelid qid x 7 days Patient not taking: Reported on 04/14/2019 08/26/18   Maxwell Caul, PA-C  ibuprofen (ADVIL,MOTRIN) 600 MG tablet Take 1 tablet (600 mg total) by mouth every 8 (eight) hours as needed. Patient not taking: Reported on 04/14/2019 04/30/15   Azalia Bilis, MD  methocarbamol (ROBAXIN) 500 MG tablet Take 1 tablet (500 mg total) by mouth every 8 (eight) hours as needed for muscle spasms. Patient not taking: Reported on 04/14/2019 04/30/15   Azalia Bilis, MD  naproxen (NAPROSYN) 500 MG tablet Take 1 tablet (500 mg total) by mouth 2 (two) times daily with a meal. Patient not taking: Reported on 04/14/2019 12/10/17   Robinson, [place] N, PA-C  norgestimate-ethinyl estradiol (ORTHO-CYCLEN,SPRINTEC,PREVIFEM) 0.25-35 MG-MCG tablet Take 1 tablet by mouth daily. Patient not taking: Reported on 04/14/2019 05/22/14   Anyanwu, Jethro Bastos, MD  ondansetron (ZOFRAN ODT) 4 MG disintegrating tablet Take 1 tablet (4 mg total) by mouth every 8 (eight) hours as needed for up to 5 days for nausea or vomiting. 04/14/19 04/19/19  Nira Conn, MD  oxyCODONE-acetaminophen (PERCOCET/ROXICET) 5-325 MG tablet Take 1 tablet by mouth every 6 (six) hours as needed for severe pain. Patient not taking: Reported on 04/14/2019 05/25/17   Hedges, Tinnie Gens, PA-C  promethazine (PHENERGAN) 25 MG tablet Take 1  tablet (25 mg total) by mouth every 6 (six) hours as needed for nausea or vomiting. 04/14/19   Nira Conn, MD  sertraline (ZOLOFT) 100 MG tablet Take 1 tablet (100 mg total) by mouth daily. Patient not taking: Reported on 04/14/2019 04/19/14   Adam Phenix, MD                                                                                                                                    Past Surgical History Past Surgical History:   Procedure Laterality Date  . LEEP     Family History Family History  Problem Relation Age of Onset  . Heart disease Mother   . Hyperlipidemia Mother   . Hypertension Mother   . Stroke Mother   . Asthma Sister   . Asthma Son     Social History Social History   Tobacco Use  . Smoking status: Former Games developer  . Smokeless tobacco: Never Used  Substance Use Topics  . Alcohol use: Yes    Comment: Hx alcohol abuse; occassionally  . Drug use: No    Types: Marijuana    Comment: occasionally   Allergies Patient has no known allergies.  Review of Systems Review of Systems All other systems are reviewed and are negative for acute change except as noted in the HPI  Physical Exam Vital Signs  I have reviewed the triage vital signs BP (!) 170/102   Pulse 97   Temp 99.5 F (37.5 C) (Oral)   Resp (!) 25   LMP 03/23/2019 (Approximate)   SpO2 96%   Physical Exam Vitals reviewed.  Constitutional:      General: She is not in acute distress.    Appearance: She is well-developed. She is obese. She is not diaphoretic.  HENT:     Head: Normocephalic and atraumatic.     Right Ear: External ear normal.     Left Ear: External ear normal.     Nose: Nose normal.  Eyes:     General: No scleral icterus.    Conjunctiva/sclera: Conjunctivae normal.  Neck:     Trachea: Phonation normal.  Cardiovascular:     Rate and Rhythm: Normal rate and regular rhythm.  Pulmonary:     Effort: Pulmonary effort is normal. No respiratory distress.     Breath sounds: No stridor.  Abdominal:     General: There is no distension.     Tenderness: There is generalized abdominal tenderness (Generalized discomfort. worse to LUQ). There is no guarding or rebound.  Musculoskeletal:        General: Normal range of motion.     Cervical back: Normal range of motion.  Neurological:     Mental Status: She is alert and oriented to person, place, and time.  Psychiatric:        Behavior: Behavior normal.      ED Results and Treatments Labs (all labs ordered are  listed, but only abnormal results are displayed) Labs Reviewed  COMPREHENSIVE METABOLIC PANEL - Abnormal; Notable for the following components:      Result Value   Potassium 2.8 (*)    Glucose, Bld 116 (*)    Creatinine, Ser 1.07 (*)    ALT 57 (*)    Total Bilirubin 1.4 (*)    Anion gap 17 (*)    All other components within normal limits  CBC - Abnormal; Notable for the following components:   WBC 11.9 (*)    All other components within normal limits  LIPASE, BLOOD  LACTIC ACID, PLASMA  I-STAT BETA HCG BLOOD, ED ([facility], [facility], [facility] ONLY)                                                                                                                         EKG  EKG Interpretation  Date/Time:    Ventricular Rate:    PR Interval:    QRS Duration:   QT Interval:    QTC Calculation:   R Axis:     Text Interpretation:        Radiology No results found.  Pertinent labs & imaging results that were available during my care of the patient were reviewed by me and considered in my medical decision making (see chart for details).  Medications Ordered in ED Medications  sodium chloride 0.9 % bolus 1,000 mL (0 mLs Intravenous Stopped 04/14/19 0249)    Followed by  sodium chloride 0.9 % bolus 1,000 mL (0 mLs Intravenous Stopped 04/14/19 0249)    Followed by  0.9 %  sodium chloride infusion (0 mLs Intravenous Stopped 04/14/19 0553)  potassium chloride 10 mEq in 100 mL IVPB (0 mEq Intravenous Stopped 04/14/19 0249)  promethazine (PHENERGAN) injection 12.5 mg (12.5 mg Intravenous Given 04/14/19 0140)  HYDROmorphone (DILAUDID) injection 0.5 mg (0.5 mg Intravenous Given 04/14/19 0139)                                                                                                                                    Procedures .Critical Care Performed by: Nira Conn, MD Authorized by: Nira Conn, MD    CRITICAL CARE Performed  by: Amadeo Garnet Cardama Total critical care time: 45 minutes Critical care time was exclusive of separately billable procedures and treating other patients. Critical care was necessary to treat or prevent imminent or life-threatening deterioration.  Critical care was time spent personally by me on the following activities: development of treatment plan with patient and/or surrogate as well as nursing, discussions with consultants, evaluation of patient's response to treatment, examination of patient, obtaining history from patient or surrogate, ordering and performing treatments and interventions, ordering and review of laboratory studies, ordering and review of radiographic studies, pulse oximetry and re-evaluation of patient's condition.    (including critical care time)  Medical Decision Making / ED Course I have reviewed the nursing notes for this encounter and the patient's prior records (if available in EHR or on provided paperwork).   [name] was evaluated in Emergency Department on 04/14/2019 for the symptoms described in the history of present illness. She was evaluated in the context of the global COVID-19 pandemic, which necessitated consideration that the patient might be at risk for infection with the SARS-CoV-2 virus that causes COVID-19. Institutional protocols and algorithms that pertain to the evaluation of patients at risk for COVID-19 are in a state of rapid change based on information released by regulatory bodies including the CDC and federal and state organizations. These policies and algorithms were followed during the patient's care in the ED.  Patient with known colitis here for inability to tolerate oral intake.  Repeat labs reassuring.  Patient has mild increase in white blood cell count.  She does have mild hypokalemia.  No significant renal insufficiency.  Lactic acid negative.  No evidence of bili obstruction or pancreatitis.  Patient provided with symptomatic  management including IV fluids, pain medicine and nausea medication.  After single dose of nausea medication, patient was able to tolerate oral intake.  She was monitored for several hours and given several IV fluid boluses.  There was no emesis here.  Patient felt to be appropriate for continued outpatient management.  Patient was amicable to plan.  Given strict return precautions.  The patient appears reasonably screened and/or stabilized for discharge and I doubt any other medical condition or other [facility] requiring further screening, evaluation, or treatment in the ED at this time prior to discharge.       Final Clinical Impression(s) / ED Diagnoses Final diagnoses:  Colitis  Hypokalemia      The patient appears reasonably screened and/or stabilized for discharge and I doubt any other medical condition or other [facility] requiring further screening, evaluation, or treatment in the ED at this time prior to discharge.  Disposition: Discharge  Condition: Good  I have discussed the results, Dx and Tx plan with the patient who expressed understanding and agree(s) with the plan. Discharge instructions discussed at great length. The patient was given strict return precautions who verbalized understanding of the instructions. No further questions at time of discharge.    ED Discharge Orders         Ordered    ondansetron (ZOFRAN ODT) 4 MG disintegrating tablet  Every 8 hours PRN,   Status:  Discontinued     04/14/19 0527    promethazine (PHENERGAN) 25 MG tablet  Every 6 hours PRN,   Status:  Discontinued     04/14/19 0527    ondansetron (ZOFRAN ODT) 4 MG disintegrating tablet  Every 8 hours PRN     04/14/19 0545    promethazine (PHENERGAN) 25 MG tablet  Every 6 hours PRN     04/14/19 0545           Follow Up: Primary care provider  Schedule an appointment as soon as possible for a visit  As needed    This chart was dictated using voice recognition software.  Despite best efforts  to proofread,  errors can occur which can change the documentation meaning.     Nira Conn, MD 04/14/19 [phone]

## 2019-04-14 NOTE — ED Notes (Signed)
IV team at bedside 

## 2019-08-09 ENCOUNTER — Encounter (HOSPITAL_COMMUNITY): Payer: Self-pay

## 2019-08-09 ENCOUNTER — Emergency Department (HOSPITAL_COMMUNITY): Payer: Medicaid Other

## 2019-08-09 ENCOUNTER — Emergency Department (HOSPITAL_COMMUNITY)
Admission: EM | Admit: 2019-08-09 | Discharge: 2019-08-09 | Disposition: A | Discharge status: Home / Self Care | Payer: Medicaid Other | Attending: Emergency Medicine | Admitting: Emergency Medicine

## 2019-08-09 ENCOUNTER — Other Ambulatory Visit: Payer: Self-pay

## 2019-08-09 DIAGNOSIS — M542 Cervicalgia: Secondary | ICD-10-CM | POA: Insufficient documentation

## 2019-08-09 DIAGNOSIS — R0789 Other chest pain: Secondary | ICD-10-CM | POA: Diagnosis not present

## 2019-08-09 DIAGNOSIS — I10 Essential (primary) hypertension: Secondary | ICD-10-CM | POA: Diagnosis not present

## 2019-08-09 DIAGNOSIS — W2210XA Striking against or struck by unspecified automobile airbag, initial encounter: Secondary | ICD-10-CM | POA: Diagnosis not present

## 2019-08-09 DIAGNOSIS — Y92410 Unspecified street and highway as the place of occurrence of the external cause: Secondary | ICD-10-CM | POA: Insufficient documentation

## 2019-08-09 DIAGNOSIS — M25562 Pain in left knee: Secondary | ICD-10-CM | POA: Insufficient documentation

## 2019-08-09 DIAGNOSIS — Z87891 Personal history of nicotine dependence: Secondary | ICD-10-CM | POA: Insufficient documentation

## 2019-08-09 DIAGNOSIS — M25561 Pain in right knee: Secondary | ICD-10-CM | POA: Diagnosis not present

## 2019-08-09 DIAGNOSIS — Y998 Other external cause status: Secondary | ICD-10-CM | POA: Diagnosis not present

## 2019-08-09 DIAGNOSIS — Y9389 Activity, other specified: Secondary | ICD-10-CM | POA: Diagnosis not present

## 2019-08-09 DIAGNOSIS — M549 Dorsalgia, unspecified: Secondary | ICD-10-CM | POA: Insufficient documentation

## 2019-08-09 MED ORDER — METHOCARBAMOL 750 MG PO TABS
750.0000 mg | ORAL_TABLET | Freq: Two times a day (BID) | ORAL | 0 refills | Status: DC | PRN
Start: 2019-08-09 — End: 2019-12-07

## 2019-08-09 MED ORDER — LIDOCAINE 5 % EX PTCH: 1.0000 | MEDICATED_PATCH | CUTANEOUS | 0 refills | Status: DC

## 2019-08-09 MED ORDER — IBUPROFEN 400 MG PO TABS
600.0000 mg | ORAL_TABLET | Freq: Once | ORAL | Status: AC
  Administered 2019-08-09: 600 mg via ORAL
  Filled 2019-08-09: qty 1
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
## 2019-08-09 NOTE — ED Notes (Signed)
All appropriate discharge materials reviewed at length with patient. Time for questions provided. Pt has no other questions at this time and verbalizes understanding of all provided materials.  

## 2019-08-09 NOTE — Discharge Instructions (Addendum)
  Expect your soreness to increase over the next 2-3 days. Take it easy, but do not lay around too much as this may make any stiffness worse.  Antiinflammatory medications: Take 600 mg of ibuprofen every 6 hours or 440 mg (over the counter dose) to 500 mg (prescription dose) of naproxen every 12 hours for the next 3 days. After this time, these medications may be used as needed for pain. Take these medications with food to avoid upset stomach. Choose only one of these medications, do not take them together. Acetaminophen (generic for Tylenol): Should you continue to have additional pain while taking the ibuprofen or naproxen, you may add in acetaminophen as needed. Your daily total maximum amount of acetaminophen from all sources should be limited to 4000mg /day for persons without liver problems, or 2000mg /day for those with liver problems. Methocarbamol: Methocarbamol (generic for Robaxin) is a muscle relaxer and can help relieve stiff muscles or muscle spasms.  Do not drive or perform other dangerous activities while taking this medication as it can cause drowsiness as well as changes in reaction time and judgement. Lidocaine patches: These are available via either prescription or over-the-counter. The over-the-counter option may be more economical one and are likely just as effective. There are multiple over-the-counter brands, such as Salonpas. Ice: May apply ice to the area over the next 24 hours for 15 minutes at a time to reduce pain, inflammation, and swelling, if present. Exercises: Be sure to perform the attached exercises starting with three times a week and working up to performing them daily. This is an essential part of preventing long term problems.  Follow up: There were some nonacute abnormalities noted on the knee x-rays.  These are recommended to have further follow-up and repeat imaging studies.  This should be done through an orthopedic specialist.  Call to make an appointment. Return:  Return to the ED should symptoms worsen.  For prescription assistance, may try using prescription discount sites or apps, such as goodrx.com
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
## 2019-11-01 ENCOUNTER — Emergency Department (HOSPITAL_COMMUNITY)
Admission: EM | Admit: 2019-11-01 | Discharge: 2019-11-02 | Disposition: A | Discharge status: Home / Self Care | Payer: Medicaid Other | Attending: Emergency Medicine | Admitting: Emergency Medicine

## 2019-11-01 ENCOUNTER — Other Ambulatory Visit: Payer: Self-pay

## 2019-11-01 ENCOUNTER — Encounter (HOSPITAL_COMMUNITY): Payer: Self-pay | Admitting: Emergency Medicine

## 2019-11-01 DIAGNOSIS — Z5321 Procedure and treatment not carried out due to patient leaving prior to being seen by health care provider: Secondary | ICD-10-CM | POA: Diagnosis not present

## 2019-11-01 DIAGNOSIS — R112 Nausea with vomiting, unspecified: Secondary | ICD-10-CM | POA: Insufficient documentation

## 2019-11-01 DIAGNOSIS — R109 Unspecified abdominal pain: Secondary | ICD-10-CM | POA: Diagnosis present

## 2019-11-01 DIAGNOSIS — R531 Weakness: Secondary | ICD-10-CM | POA: Diagnosis not present

## 2019-11-01 LAB — URINALYSIS, ROUTINE W REFLEX MICROSCOPIC
Bacteria, UA: NONE SEEN
Bilirubin Urine: NEGATIVE
Glucose, UA: NEGATIVE mg/dL
Ketones, ur: 20 mg/dL — AB
Leukocytes,Ua: NEGATIVE
Nitrite: NEGATIVE
Protein, ur: 100 mg/dL — AB
Specific Gravity, Urine: 1.026 (ref 1.005–1.030)
pH: 5 (ref 5.0–8.0)

## 2019-11-01 LAB — COMPREHENSIVE METABOLIC PANEL
ALT: 23 U/L (ref 0–44)
AST: 19 U/L (ref 15–41)
Albumin: 3.5 g/dL (ref 3.5–5.0)
Alkaline Phosphatase: 82 U/L (ref 38–126)
Anion gap: 13 (ref 5–15)
BUN: 9 mg/dL (ref 6–20)
CO2: 24 mmol/L (ref 22–32)
Calcium: 9.7 mg/dL (ref 8.9–10.3)
Chloride: 102 mmol/L (ref 98–111)
Creatinine, Ser: 0.95 mg/dL (ref 0.44–1.00)
GFR calc Af Amer: >60 mL/min (ref >60)
GFR calc non Af Amer: >60 mL/min (ref >60)
Glucose, Bld: 132 mg/dL — ABNORMAL HIGH (ref 70–99)
Potassium: 3 mmol/L — ABNORMAL LOW (ref 3.5–5.1)
Sodium: 139 mmol/L (ref 135–145)
Total Bilirubin: 0.6 mg/dL (ref 0.3–1.2)
Total Protein: 7.6 g/dL (ref 6.5–8.1)

## 2019-11-01 LAB — CBC
HCT: 36 % (ref 36.0–46.0)
Hemoglobin: 11.6 g/dL — ABNORMAL LOW (ref 12.0–15.0)
MCH: 29.7 pg (ref 26.0–34.0)
MCHC: 32.2 g/dL (ref 30.0–36.0)
MCV: 92.1 fL (ref 80.0–100.0)
Platelets: 270 10*3/uL (ref 150–400)
RBC: 3.91 MIL/uL (ref 3.87–5.11)
RDW: 12.7 % (ref 11.5–15.5)
WBC: 13.1 10*3/uL — ABNORMAL HIGH (ref 4.0–10.5)
nRBC: 0 % (ref 0.0–0.2)

## 2019-11-01 LAB — I-STAT BETA HCG BLOOD, ED (MC, WL, AP ONLY): I-stat hCG, quantitative: <5 m[IU]/mL (ref <5)

## 2019-11-01 LAB — LIPASE, BLOOD: Lipase: 27 U/L (ref 11–51)

## 2019-11-01 MED ORDER — ONDANSETRON 4 MG PO TBDP
4.0000 mg | ORAL_TABLET | Freq: Once | ORAL | Status: AC | PRN
  Administered 2019-11-01: 4 mg via ORAL
  Filled 2019-11-01: qty 1

## 2019-11-01 NOTE — ED Triage Notes (Signed)
Pt c/o abdominal pain, nausea/vomiting, and weakness that started this morning.

## 2019-11-02 ENCOUNTER — Other Ambulatory Visit: Payer: Self-pay

## 2019-11-02 ENCOUNTER — Emergency Department (HOSPITAL_COMMUNITY)
Admission: EM | Admit: 2019-11-02 | Discharge: 2019-11-02 | Disposition: A | Discharge status: Home / Self Care | Payer: Medicaid Other | Source: Home / Self Care

## 2019-11-02 ENCOUNTER — Encounter (HOSPITAL_COMMUNITY): Payer: Self-pay

## 2019-11-02 DIAGNOSIS — R1110 Vomiting, unspecified: Secondary | ICD-10-CM | POA: Insufficient documentation

## 2019-11-02 DIAGNOSIS — Z5321 Procedure and treatment not carried out due to patient leaving prior to being seen by health care provider: Secondary | ICD-10-CM | POA: Insufficient documentation

## 2019-11-02 NOTE — ED Notes (Signed)
Pt called for VS, no response.  °

## 2019-11-02 NOTE — ED Notes (Signed)
Pt called multiple times for vitals, no answer 

## 2019-11-02 NOTE — ED Triage Notes (Signed)
Pt reports she has been vomiting since yesterday am, recently dx w/UTI and BV prescribed vaginal gel abx and PO abx, pt is unsure if this is causing her vomiting. Pt states shes unable to keep anything down but has been drinking milk.

## 2019-11-03 ENCOUNTER — Encounter (HOSPITAL_COMMUNITY): Payer: Self-pay | Admitting: Emergency Medicine

## 2019-11-03 ENCOUNTER — Emergency Department (HOSPITAL_COMMUNITY): Payer: Medicaid Other

## 2019-11-03 ENCOUNTER — Emergency Department (HOSPITAL_COMMUNITY)
Admission: EM | Admit: 2019-11-03 | Discharge: 2019-11-03 | Disposition: A | Discharge status: Home / Self Care | Payer: Medicaid Other | Attending: Emergency Medicine | Admitting: Emergency Medicine

## 2019-11-03 ENCOUNTER — Other Ambulatory Visit: Payer: Self-pay

## 2019-11-03 DIAGNOSIS — Z6841 Body Mass Index (BMI) 40.0 and over, adult: Secondary | ICD-10-CM | POA: Diagnosis not present

## 2019-11-03 DIAGNOSIS — R079 Chest pain, unspecified: Secondary | ICD-10-CM | POA: Diagnosis not present

## 2019-11-03 DIAGNOSIS — I10 Essential (primary) hypertension: Secondary | ICD-10-CM | POA: Insufficient documentation

## 2019-11-03 DIAGNOSIS — Z20822 Contact with and (suspected) exposure to covid-19: Secondary | ICD-10-CM | POA: Insufficient documentation

## 2019-11-03 DIAGNOSIS — R112 Nausea with vomiting, unspecified: Secondary | ICD-10-CM | POA: Diagnosis not present

## 2019-11-03 DIAGNOSIS — E669 Obesity, unspecified: Secondary | ICD-10-CM | POA: Diagnosis not present

## 2019-11-03 DIAGNOSIS — K2920 Alcoholic gastritis without bleeding: Secondary | ICD-10-CM | POA: Diagnosis not present

## 2019-11-03 DIAGNOSIS — R109 Unspecified abdominal pain: Secondary | ICD-10-CM | POA: Diagnosis present

## 2019-11-03 DIAGNOSIS — Z87891 Personal history of nicotine dependence: Secondary | ICD-10-CM | POA: Diagnosis not present

## 2019-11-03 DIAGNOSIS — L02411 Cutaneous abscess of right axilla: Secondary | ICD-10-CM | POA: Insufficient documentation

## 2019-11-03 DIAGNOSIS — R1110 Vomiting, unspecified: Secondary | ICD-10-CM

## 2019-11-03 LAB — CBC
HCT: 37 % (ref 36.0–46.0)
Hemoglobin: 12.5 g/dL (ref 12.0–15.0)
MCH: 30.6 pg (ref 26.0–34.0)
MCHC: 33.8 g/dL (ref 30.0–36.0)
MCV: 90.5 fL (ref 80.0–100.0)
Platelets: 335 10*3/uL (ref 150–400)
RBC: 4.09 MIL/uL (ref 3.87–5.11)
RDW: 12.6 % (ref 11.5–15.5)
WBC: 11.6 10*3/uL — ABNORMAL HIGH (ref 4.0–10.5)
nRBC: 0 % (ref 0.0–0.2)

## 2019-11-03 LAB — COMPREHENSIVE METABOLIC PANEL
ALT: 20 U/L (ref 0–44)
AST: 33 U/L (ref 15–41)
Albumin: 3.8 g/dL (ref 3.5–5.0)
Alkaline Phosphatase: 101 U/L (ref 38–126)
Anion gap: 14 (ref 5–15)
BUN: 15 mg/dL (ref 6–20)
CO2: 24 mmol/L (ref 22–32)
Calcium: 9.4 mg/dL (ref 8.9–10.3)
Chloride: 99 mmol/L (ref 98–111)
Creatinine, Ser: 0.93 mg/dL (ref 0.44–1.00)
GFR calc Af Amer: >60 mL/min (ref >60)
GFR calc non Af Amer: >60 mL/min (ref >60)
Glucose, Bld: 124 mg/dL — ABNORMAL HIGH (ref 70–99)
Potassium: 3.8 mmol/L (ref 3.5–5.1)
Sodium: 137 mmol/L (ref 135–145)
Total Bilirubin: 1.5 mg/dL — ABNORMAL HIGH (ref 0.3–1.2)
Total Protein: 8.7 g/dL — ABNORMAL HIGH (ref 6.5–8.1)

## 2019-11-03 LAB — I-STAT BETA HCG BLOOD, ED (MC, WL, AP ONLY): I-stat hCG, quantitative: 32.8 m[IU]/mL — ABNORMAL HIGH (ref <5)

## 2019-11-03 LAB — LIPASE, BLOOD: Lipase: 24 U/L (ref 11–51)

## 2019-11-03 LAB — HCG, QUANTITATIVE, PREGNANCY: hCG, Beta Chain, Quant, S: 1 m[IU]/mL (ref <5)

## 2019-11-03 LAB — SARS CORONAVIRUS 2 BY RT PCR (HOSPITAL ORDER, PERFORMED IN ~~LOC~~ HOSPITAL LAB): SARS Coronavirus 2: NEGATIVE

## 2019-11-03 MED ORDER — FENTANYL CITRATE (PF) 100 MCG/2ML IJ SOLN
100.0000 ug | Freq: Once | INTRAMUSCULAR | Status: AC
  Administered 2019-11-03: 100 ug via INTRAVENOUS
  Filled 2019-11-03: qty 2

## 2019-11-03 MED ORDER — PANTOPRAZOLE SODIUM 40 MG PO TBEC: 40.0000 mg | DELAYED_RELEASE_TABLET | Freq: Every day | ORAL | 0 refills | Status: DC

## 2019-11-03 MED ORDER — ONDANSETRON HCL 4 MG/2ML IJ SOLN
4.0000 mg | Freq: Once | INTRAMUSCULAR | Status: AC
  Administered 2019-11-03: 4 mg via INTRAMUSCULAR
  Filled 2019-11-03: qty 2

## 2019-11-03 MED ORDER — SULFAMETHOXAZOLE-TRIMETHOPRIM 800-160 MG PO TABS: 1.0000 | ORAL_TABLET | Freq: Two times a day (BID) | ORAL | 0 refills | Status: AC

## 2019-11-03 MED ORDER — ALUM & MAG HYDROXIDE-SIMETH 200-200-20 MG/5ML PO SUSP
30.0000 mL | Freq: Once | ORAL | Status: AC
  Administered 2019-11-03: 30 mL via ORAL
  Filled 2019-11-03: qty 30

## 2019-11-03 MED ORDER — LIDOCAINE-EPINEPHRINE (PF) 2 %-1:200000 IJ SOLN
20.0000 mL | Freq: Once | INTRAMUSCULAR | Status: AC
  Administered 2019-11-03: 20 mL via INTRADERMAL
  Filled 2019-11-03: qty 20

## 2019-11-03 MED ORDER — METOCLOPRAMIDE HCL 10 MG PO TABS: 10.0000 mg | ORAL_TABLET | Freq: Three times a day (TID) | ORAL | 0 refills | Status: DC | PRN

## 2019-11-03 MED ORDER — LIDOCAINE VISCOUS HCL 2 % MT SOLN
15.0000 mL | Freq: Once | OROMUCOSAL | Status: AC
  Administered 2019-11-03: 15 mL via ORAL
  Filled 2019-11-03: qty 15

## 2019-11-03 MED ORDER — SODIUM CHLORIDE 0.9 % IV BOLUS
1000.0000 mL | Freq: Once | INTRAVENOUS | Status: AC
  Administered 2019-11-03: 1000 mL via INTRAVENOUS

## 2019-11-03 MED ORDER — METOCLOPRAMIDE HCL 5 MG/ML IJ SOLN
10.0000 mg | Freq: Once | INTRAMUSCULAR | Status: AC
  Administered 2019-11-03: 10 mg via INTRAVENOUS
  Filled 2019-11-03: qty 2

## 2019-11-03 MED ORDER — ONDANSETRON 4 MG PO TBDP: 4.0000 mg | ORAL_TABLET | Freq: Three times a day (TID) | ORAL | 0 refills | Status: DC | PRN

## 2019-11-03 NOTE — ED Provider Notes (Signed)
[facility]-EMERGENCY DEPT Provider Note   CSN: [id number] Arrival date & time: 11/03/19  1057     History Chief Complaint  Patient presents with  . Abdominal Pain  . Nausea  . Emesis    [name] is a 39 y.o. female.  HPI 39 year old female presents with vomiting.  Started about 3 days ago.  Has had numerous episodes of emesis.  No bowel movements during this time.  She is having sore throat, chest pain, and abdominal pain.  No cough or dyspnea.  Has had chills but no fevers.  Recently completed antibiotics for UTI.  2 days before the symptoms started she started using topical treatment for bacterial vaginosis. She feels weak and dehydrated.  She also notes a right axilla abscess that started draining the same day as her vomiting started.   She does note that this has happened before and attributes it to reflux. She drinks alcohol, often daily. She uses marijuana daily.   Past Medical History:  Diagnosis Date  . Acid reflux   . Alcohol abuse   . Anemia   . Depression   . GERD (gastroesophageal reflux disease)   . H/O suicide attempt   . Hypertension   . Vaginal Pap smear, abnormal     Patient Active Problem List   Diagnosis Date Noted  . Heart abnormality   . History of preterm delivery   . History of suicide attempt 10/23/2013  . Substance use disorder 10/23/2013  . History of depression 10/23/2013  . Abnormal Pap smear of cervix 10/23/2013  . Hypokalemia 08/08/2013  . Essential hypertension, benign 08/08/2013  . History of alcohol use 08/08/2013  . H/O alcoholic gastritis 08/08/2013    Past Surgical History:  Procedure Laterality Date  . LEEP       OB History    Gravida  5   Para  3   Term  3   Preterm  0   AB  2   Living  2     SAB  2   TAB      Ectopic      Multiple  0   Live Births  2           Family History  Problem Relation Age of Onset  . Heart disease Mother   . Hyperlipidemia Mother   .  Hypertension Mother   . Stroke Mother   . Asthma Sister   . Asthma Son     Social History   Tobacco Use  . Smoking status: Former Games developer  . Smokeless tobacco: Never Used  Vaping Use  . Vaping Use: Never used  Substance Use Topics  . Alcohol use: Yes    Comment: Hx alcohol abuse; occassionally  . Drug use: No    Types: Marijuana    Comment: occasionally    Home Medications Prior to Admission medications   Medication Sig Start Date End Date Taking? Authorizing Provider  ciprofloxacin (CIPRO) 500 MG tablet Take 1 tablet (500 mg total) by mouth 2 (two) times daily. 04/11/19   Raeford Razor, MD  erythromycin ophthalmic ointment Place a 1/2 inch ribbon of ointment into the lower eyelid qid x 7 days Patient not taking: Reported on 04/14/2019 08/26/18   Maxwell Caul, PA-C  hydrochlorothiazide (HYDRODIURIL) 25 MG tablet Take 25 mg by mouth daily. 07/25/19   [provider]  HYDROcodone-acetaminophen (NORCO/VICODIN) 5-325 MG tablet Take 1 tablet by mouth every 4 (four) hours as needed for severe pain.  04/11/19   Raeford Razor, MD  ibuprofen (ADVIL,MOTRIN) 600 MG tablet Take 1 tablet (600 mg total) by mouth every 8 (eight) hours as needed. Patient not taking: Reported on 04/14/2019 04/30/15   Azalia Bilis, MD  lidocaine (LIDODERM) 5 % Place 1 patch onto the skin daily. Remove & Discard patch within 12 hours or as directed by MD 08/09/19   Joy, Shawn C, PA-C  meloxicam (MOBIC) 7.5 MG tablet Take 7.5 mg by mouth daily. 09/04/19   [provider]  methocarbamol (ROBAXIN) 750 MG tablet Take 1 tablet (750 mg total) by mouth 2 (two) times daily as needed for muscle spasms (or muscle tightness). 08/09/19   Joy, Shawn C, PA-C  metoCLOPramide (REGLAN) 10 MG tablet Take 1 tablet (10 mg total) by mouth every 8 (eight) hours as needed for nausea or refractory nausea / vomiting. 11/03/19   Pricilla Loveless, MD  metroNIDAZOLE (FLAGYL) 500 MG tablet Take 1 tablet (500 mg total) by mouth 2 (two)  times daily. 04/11/19   Raeford Razor, MD  metroNIDAZOLE (METROGEL) 0.75 % vaginal gel Place 1 Applicatorful vaginally at bedtime. 08/01/19   [provider]  naproxen (NAPROSYN) 500 MG tablet Take 1 tablet (500 mg total) by mouth 2 (two) times daily with a meal. Patient not taking: Reported on 04/14/2019 12/10/17   Robinson, [place] N, PA-C  norgestimate-ethinyl estradiol (ORTHO-CYCLEN,SPRINTEC,PREVIFEM) 0.25-35 MG-MCG tablet Take 1 tablet by mouth daily. Patient not taking: Reported on 04/14/2019 05/22/14   Anyanwu, Jethro Bastos, MD  ondansetron (ZOFRAN ODT) 4 MG disintegrating tablet Take 1 tablet (4 mg total) by mouth every 8 (eight) hours as needed for nausea or vomiting. 11/03/19   Pricilla Loveless, MD  ondansetron (ZOFRAN) 4 MG tablet Take 1 tablet (4 mg total) by mouth every 6 (six) hours as needed for nausea or vomiting. 04/11/19   Raeford Razor, MD  oxyCODONE-acetaminophen (PERCOCET/ROXICET) 5-325 MG tablet Take 1 tablet by mouth every 6 (six) hours as needed for severe pain. Patient not taking: Reported on 04/14/2019 05/25/17   Hedges, Tinnie Gens, PA-C  pantoprazole (PROTONIX) 40 MG tablet Take 1 tablet (40 mg total) by mouth daily. 11/03/19   Pricilla Loveless, MD  promethazine (PHENERGAN) 25 MG tablet Take 1 tablet (25 mg total) by mouth every 6 (six) hours as needed for nausea or vomiting. 04/14/19   Nira Conn, MD  sertraline (ZOLOFT) 100 MG tablet Take 1 tablet (100 mg total) by mouth daily. Patient not taking: Reported on 04/14/2019 04/19/14   Adam Phenix, MD  sulfamethoxazole-trimethoprim (BACTRIM DS) 800-160 MG tablet Take 1 tablet by mouth 2 (two) times daily for 5 days. 11/03/19 11/08/19  Pricilla Loveless, MD    Allergies    Patient has no known allergies.  Review of Systems   Review of Systems  Constitutional: Positive for chills. Negative for fever.  Respiratory: Negative for cough and shortness of breath.   Cardiovascular: Positive for chest pain.  Gastrointestinal: Positive  for abdominal pain, constipation and vomiting. Negative for diarrhea.  Genitourinary: Negative for dysuria.  All other systems reviewed and are negative.   Physical Exam Updated Vital Signs BP (!) 152/119 (BP Location: Left Arm)   Pulse 96   Temp 99.9 F (37.7 C) (Oral)   Resp 18   Ht 5\' 3"  (1.6 m)   Wt 108.9 kg   LMP 10/18/2019   SpO2 99%   BMI 42.51 kg/m   Physical Exam Vitals and nursing note reviewed.  Constitutional:      Appearance: She  is well-developed. She is obese.  HENT:     Head: Normocephalic and atraumatic.     Right Ear: External ear normal.     Left Ear: External ear normal.     Nose: Nose normal.  Eyes:     General:        Right eye: No discharge.        Left eye: No discharge.  Cardiovascular:     Rate and Rhythm: Normal rate and regular rhythm.     Heart sounds: Normal heart sounds.  Pulmonary:     Effort: Pulmonary effort is normal.     Breath sounds: Normal breath sounds.  Abdominal:     Palpations: Abdomen is soft.     Tenderness: There is no abdominal tenderness.  Skin:    General: Skin is warm and dry.     Comments: There is a small right axillary abscess. Tender without erythema. Scant drainage through 2 holes in the skin near it.  Neurological:     Mental Status: She is alert.  Psychiatric:        Mood and Affect: Mood is not anxious.     ED Results / Procedures / Treatments   Labs (all labs ordered are listed, but only abnormal results are displayed) Labs Reviewed  COMPREHENSIVE METABOLIC PANEL - Abnormal; Notable for the following components:      Result Value   Glucose, Bld 124 (*)    Total Protein 8.7 (*)    Total Bilirubin 1.5 (*)    All other components within normal limits  CBC - Abnormal; Notable for the following components:   WBC 11.6 (*)    All other components within normal limits  I-STAT BETA HCG BLOOD, ED ([facility], [facility], [facility] ONLY) - Abnormal; Notable for the following components:   I-stat hCG, quantitative 32.8 (*)     All other components within normal limits  URINE CULTURE  SARS CORONAVIRUS 2 BY RT PCR (HOSPITAL ORDER, PERFORMED IN [facility])  LIPASE, BLOOD  HCG, QUANTITATIVE, PREGNANCY  URINALYSIS, ROUTINE W REFLEX MICROSCOPIC    EKG EKG Interpretation  Date/Time:  Saturday November 03 2019 20:43:15 EDT Ventricular Rate:  91 PR Interval:    QRS Duration: 82 QT Interval:  414 QTC Calculation: 510 R Axis:   21 Text Interpretation: Sinus rhythm Multiple ventricular premature complexes Left ventricular hypertrophy Borderline prolonged QT interval Confirmed by Pricilla Loveless [id number]) on 11/03/2019 10:26:39 PM   Radiology DG Chest Portable 1 View  Result Date: 11/03/2019 CLINICAL DATA:  Chest pain, nausea, vomiting, and abdominal pain for 4 days, on antibiotics for UTI, RIGHT arm abscess EXAM: PORTABLE CHEST 1 VIEW COMPARISON:  Portable exam 1953 hours compared to 08/09/2019 FINDINGS: Normal heart size, mediastinal contours, and pulmonary vascularity. Lungs clear. No pleural effusion or pneumothorax. Bones unremarkable. IMPRESSION: Normal exam. Electronically Signed   By: Ulyses Southward M.D.   On: 11/03/2019 20:01    Procedures Procedures (including critical care time)  Medications Ordered in ED Medications  ondansetron (ZOFRAN) injection 4 mg (4 mg Intramuscular Given 11/03/19 1149)  metoCLOPramide (REGLAN) injection 10 mg (10 mg Intravenous Given 11/03/19 2101)  sodium chloride 0.9 % bolus 1,000 mL (1,000 mLs Intravenous New Bag/Given 11/03/19 2101)  lidocaine-EPINEPHrine (XYLOCAINE W/EPI) 2 %-1:200000 (PF) injection 20 mL (20 mLs Intradermal Given by Other 11/03/19 2146)  fentaNYL (SUBLIMAZE) injection 100 mcg (100 mcg Intravenous Given 11/03/19 2147)  alum & mag hydroxide-simeth (MAALOX/MYLANTA) 200-200-20 MG/5ML suspension 30 mL (30 mLs Oral Given 11/03/19  2145)    And  lidocaine (XYLOCAINE) 2 % viscous mouth solution 15 mL (15 mLs Oral Given 11/03/19 2145)    ED Course  I have  reviewed the triage vital signs and the nursing notes.  Pertinent labs & imaging results that were available during my care of the patient were reviewed by me and considered in my medical decision making (see chart for details).    MDM Rules/Calculators/A&P                          After being given Reglan, the patient has had no further nausea and is now tolerating p.o.  She has a benign abdominal exam.  It sounds like this is a recurrent issue and I counseled her on stopping marijuana use as well as cutting back on alcohol.  This may well be alcoholic gastritis as she is requesting something to help numb her throat.  Otherwise, she does have a small appearing abscess that is draining in her right axilla.  I did try and drain/decompressed but nothing came out.  Given his already draining I will put antibiotics on and recommend warm soaks.  Otherwise, she has no urinary symptoms and at this point I do not think she needs emergent CT scan.  Initial i-STAT pregnancy test is slightly positive but the lab hCG is negative and I think the i-STAT was the more likely to be incorrect.  Will give medications for nausea at home as well as PPI and the short course of antibiotics.  Return precautions.  Jaclyn PrimeShakeema S Delcastillo was evaluated in Emergency Department on 11/03/2019 for the symptoms described in the history of present illness. She was evaluated in the context of the global COVID-19 pandemic, which necessitated consideration that the patient might be at risk for infection with the SARS-CoV-2 virus that causes COVID-19. Institutional protocols and algorithms that pertain to the evaluation of patients at risk for COVID-19 are in a state of rapid change based on information released by regulatory bodies including the CDC and federal and state organizations. These policies and algorithms were followed during the patient's care in the ED.  Final Clinical Impression(s) / ED Diagnoses Final diagnoses:  Vomiting in adult   Acute alcoholic gastritis without hemorrhage  Abscess of right axilla    Rx / DC Orders ED Discharge Orders         Ordered    ondansetron (ZOFRAN ODT) 4 MG disintegrating tablet  Every 8 hours PRN        11/03/19 2255    metoCLOPramide (REGLAN) 10 MG tablet  Every 8 hours PRN        11/03/19 2255    pantoprazole (PROTONIX) 40 MG tablet  Daily        11/03/19 2255    sulfamethoxazole-trimethoprim (BACTRIM DS) 800-160 MG tablet  2 times daily        11/03/19 2255           Pricilla LovelessGoldston, Makynlie Rossini, MD 11/03/19 2306

## 2019-11-03 NOTE — ED Triage Notes (Signed)
Patient here from home via EMS reporting abd pain, n/v x4 days. Seen at [facility] yesterday. Reports that she has been on antibiotics for UTI and BV. Also abscess to r arm that is draining. Unsure what is causing vomiting.

## 2019-11-03 NOTE — Discharge Instructions (Addendum)
If you develop worsening, continued, or recurrent abdominal pain, uncontrolled vomiting, fever, chest or back pain, or any other new/concerning symptoms then return to the ER for evaluation.  

## 2019-11-29 ENCOUNTER — Ambulatory Visit: Payer: Medicaid Other | Admitting: Internal Medicine

## 2019-11-29 ENCOUNTER — Other Ambulatory Visit: Payer: Self-pay

## 2019-11-29 ENCOUNTER — Encounter: Payer: Self-pay | Admitting: Internal Medicine

## 2019-11-29 VITALS — BP 118/80 | HR 78 | Ht 63.0 in | Wt 240.2 lb

## 2019-11-29 DIAGNOSIS — R0683 Snoring: Secondary | ICD-10-CM

## 2019-11-29 DIAGNOSIS — G4733 Obstructive sleep apnea (adult) (pediatric): Secondary | ICD-10-CM

## 2019-11-29 DIAGNOSIS — Z23 Encounter for immunization: Secondary | ICD-10-CM

## 2019-11-29 DIAGNOSIS — Q249 Congenital malformation of heart, unspecified: Secondary | ICD-10-CM

## 2019-11-29 NOTE — Patient Instructions (Addendum)
Order- schedule home sleep test   Dx OSA  Please call us about 2 weeks after your sleep test to see if results and recommendations are ready yet. If appropriate, we may be able to start treatment before we see you next.  Order- flu vaccine standard   I do recommend you go ahead and get Covid vaccinated now  Please call if we can help

## 2019-11-29 NOTE — Assessment & Plan Note (Signed)
Hx and exam suggest OSA. Appropriate discussion done and questions answered. Plan- sleep study

## 2019-11-29 NOTE — Assessment & Plan Note (Signed)
Concern raised of possible connection between sleep disordered breathing and cardiac rhythm. Pulse regular on my exam and she says medication helps.

## 2019-11-29 NOTE — Progress Notes (Signed)
11/29/19- 34 yoF former smoker for sleep evaluation courtesy of Norva Riffle, [place] with concern of OSA. \Medical problem list includes HTN, Substance [place], ETOH, Hx Suicide Attempt, GERD, Depression, Hypokalemia Epworth score- 15 Body weight today- Covid vax- None Flu vax- standard today ------Pt is being referred by PCP due to having an irregular heart rate and due to this, they are wanting her to have a sleep study. Pt has been told that she snores. Pt says that she feels like she has not slept when she wakes up. Here with female companion, aware she snores loudly, some witnessed apnea, some daytime tiredness. Occ muscle relaxer at bedtime to help sleep. Little caffeine.  Denies ENT surgery, lung or other heart issues.  Wakes several times during the night. Denies complex parasomnias. CXR 11/03/19-  Normal exam.  Prior to Admission medications   Medication Sig Start Date End Date Taking? Authorizing Provider  diltiazem (CARDIZEM SR) 60 MG 12 hr capsule Take 60 mg by mouth 2 (two) times daily. 11/14/19  Yes [provider]  hydrochlorothiazide (HYDRODIURIL) 25 MG tablet Take 25 mg by mouth daily. 07/25/19  Yes [provider]  HYDROcodone-acetaminophen (NORCO/VICODIN) 5-325 MG tablet Take 1 tablet by mouth every 4 (four) hours as needed for severe pain. 04/11/19  Yes Raeford Razor, MD  ibuprofen (ADVIL,MOTRIN) 600 MG tablet Take 1 tablet (600 mg total) by mouth every 8 (eight) hours as needed. 04/30/15  Yes Azalia Bilis, MD  meloxicam (MOBIC) 7.5 MG tablet Take 7.5 mg by mouth daily. 09/04/19  Yes [provider]  methocarbamol (ROBAXIN) 750 MG tablet Take 1 tablet (750 mg total) by mouth 2 (two) times daily as needed for muscle spasms (or muscle tightness). 08/09/19  Yes Joy, Shawn C, PA-C  metoCLOPramide (REGLAN) 10 MG tablet Take 1 tablet (10 mg total) by mouth every 8 (eight) hours as needed for nausea or refractory nausea / vomiting. 11/03/19  Yes Pricilla Loveless, MD   naproxen (NAPROSYN) 500 MG tablet Take 1 tablet (500 mg total) by mouth 2 (two) times daily with a meal. 12/10/17  Yes Robinson, [place] N, PA-C  ondansetron (ZOFRAN ODT) 4 MG disintegrating tablet Take 1 tablet (4 mg total) by mouth every 8 (eight) hours as needed for nausea or vomiting. 11/03/19  Yes Pricilla Loveless, MD  ondansetron (ZOFRAN) 4 MG tablet Take 1 tablet (4 mg total) by mouth every 6 (six) hours as needed for nausea or vomiting. 04/11/19  Yes Raeford Razor, MD  oxyCODONE-acetaminophen (PERCOCET/ROXICET) 5-325 MG tablet Take 1 tablet by mouth every 6 (six) hours as needed for severe pain. 05/25/17  Yes Hedges, Tinnie Gens, PA-C  pantoprazole (PROTONIX) 40 MG tablet Take 1 tablet (40 mg total) by mouth daily. 11/03/19  Yes Pricilla Loveless, MD  promethazine (PHENERGAN) 25 MG tablet Take 1 tablet (25 mg total) by mouth every 6 (six) hours as needed for nausea or vomiting. 04/14/19  Yes Cardama, Amadeo Garnet, MD  sertraline (ZOLOFT) 100 MG tablet Take 1 tablet (100 mg total) by mouth daily. 04/19/14  Yes Adam Phenix, MD   Past Medical History:  Diagnosis Date  . Acid reflux   . Alcohol abuse   . Anemia   . Depression   . GERD (gastroesophageal reflux disease)   . H/O suicide attempt   . Hypertension   . Vaginal Pap smear, abnormal    Past Surgical History:  Procedure Laterality Date  . LEEP     Family History  Problem Relation Age of Onset  .  Heart disease Mother   . Hyperlipidemia Mother   . Hypertension Mother   . Stroke Mother   . Asthma Sister   . Asthma Son    Social History   Socioeconomic History  . Marital status: Single    Spouse name: Not on file  . Number of children: Not on file  . Years of education: Not on file  . Highest education level: Not on file  Occupational History  . Not on file  Tobacco Use  . Smoking status: Former Games developer  . Smokeless tobacco: Never Used  Vaping Use  . Vaping Use: Never used  Substance and Sexual Activity  . Alcohol use: Yes     Comment: Hx alcohol abuse; occassionally  . Drug use: No    Types: Marijuana    Comment: occasionally  . Sexual activity: Not Currently    Birth control/protection: None  Other Topics Concern  . Not on file  Social History Narrative  . Not on file   Social Determinants of Health   Financial Resource Strain:   . Difficulty of Paying Living Expenses: Not on file  Food Insecurity:   . Worried About Programme researcher, broadcasting/film/video in the Last Year: Not on file  . Ran Out of Food in the Last Year: Not on file  Transportation Needs:   . Lack of Transportation (Medical): Not on file  . Lack of Transportation (Non-Medical): Not on file  Physical Activity:   . Days of Exercise per Week: Not on file  . Minutes of Exercise per Session: Not on file  Stress:   . Feeling of Stress : Not on file  Social Connections:   . Frequency of Communication with Friends and Family: Not on file  . Frequency of Social Gatherings with Friends and Family: Not on file  . Attends Religious Services: Not on file  . Active Member of Clubs or Organizations: Not on file  . Attends Banker Meetings: Not on file  . Marital Status: Not on file  Intimate Partner Violence:   . Fear of Current or Ex-Partner: Not on file  . Emotionally Abused: Not on file  . Physically Abused: Not on file  . Sexually Abused: Not on file   ROS-see HPI   + = positive Constitutional:    weight loss, night sweats, fevers, chills, +fatigue, lassitude. HEENT:    headaches, difficulty swallowing, tooth/dental problems, sore throat,       sneezing, itching, ear ache, nasal congestion, post nasal drip, snoring CV:    chest pain, orthopnea, PND, swelling in lower extremities, anasarca,                                  dizziness, palpitations Resp:   shortness of breath with exertion or at rest.                productive cough,   non-productive cough, coughing up of blood.              change in color of mucus.  wheezing.   Skin:     rash or lesions. GI:  No-   heartburn, indigestion, abdominal pain, nausea, vomiting, diarrhea,                 change in bowel habits, loss of appetite GU: dysuria, change in color of urine, no urgency or frequency.   flank pain. MS:   joint pain,  stiffness, decreased range of motion, back pain. Neuro-     nothing unusual Psych:  change in mood or affect.  depression or anxiety.   memory loss.  OBJ- Physical Exam General- Alert, Oriented, Affect-appropriate, Distress- none acute, + obese Skin- rash-none, lesions- none, excoriation- none Lymphadenopathy- none Head- atraumatic            Eyes- Gross vision intact, PERRLA, conjunctivae and secretions clear            Ears- Hearing, canals-normal            Nose- Clear, no-Septal dev, mucus, polyps, erosion, perforation             Throat- Mallampati IV , mucosa clear , drainage- none, tonsils- atrophic, + teeth Neck- flexible , trachea midline, no stridor , thyroid nl, carotid no bruit Chest - symmetrical excursion , unlabored           Heart/CV- RRR , no murmur , no gallop  , no rub, nl s1 s2                           - JVD- none , edema- none, stasis changes- none, varices- none           Lung- clear to P&A, wheeze- none, cough- none , dullness-none, rub- none           Chest wall-  Abd-  Br/ Gen/ Rectal- Not done, not indicated Extrem- cyanosis- none, clubbing, none, atrophy- none, strength- nl Neuro- grossly intact to observation

## 2019-12-06 NOTE — Progress Notes (Signed)
Patient referred by Trey Sailors, PA for palpitations  Subjective:   [name], female    DOB: 1980-10-17, 39 y.o.   MRN: [id number]   Chief Complaint  Patient presents with  . Palpitations  . Abnormal ECG     HPI  39 y.o. African American female with hypertension, palpitations  Patient has been having symptoms of palpitations: 7 weeks.  Likely secondary to episode, she will experience lightheadedness, blurring her symptoms.  She did not experience chest pain, which improved after starting diltiazem.   Past Medical History:  Diagnosis Date  . Acid reflux   . Alcohol abuse   . Anemia   . Depression   . GERD (gastroesophageal reflux disease)   . H/O suicide attempt   . Hypertension   . Vaginal Pap smear, abnormal      Past Surgical History:  Procedure Laterality Date  . LEEP       Social History   Tobacco Use  Smoking Status Former Smoker  Smokeless Tobacco Never Used    Social History   Substance and Sexual Activity  Alcohol Use Yes   Comment: Hx alcohol abuse; occassionally     Family History  Problem Relation Age of Onset  . Heart disease Mother   . Hyperlipidemia Mother   . Hypertension Mother   . Stroke Mother   . Asthma Sister   . Asthma Son      Current Outpatient Medications on File Prior to Visit  Medication Sig Dispense Refill  . diltiazem (CARDIZEM SR) 60 MG 12 hr capsule Take 60 mg by mouth 2 (two) times daily.    . hydrochlorothiazide (HYDRODIURIL) 25 MG tablet Take 25 mg by mouth daily.    Marland Kitchen linaclotide (LINZESS) 72 MCG capsule Take 72 mcg by mouth daily before breakfast.    . sertraline (ZOLOFT) 100 MG tablet Take 1 tablet (100 mg total) by mouth daily. 30 tablet 1   No current facility-administered medications on file prior to visit.    Cardiovascular and other pertinent studies:   EKG 11/14/2019:  1. Sinus Rhythm w/ frequent Ectopic Premature complexes moderate voltage criteria for LVH 2. Consider normal  variant 3. Abnormal Rhythm ECG   Recent labs: 11/15/2019: Glucose 114, BUN/Cr 10/0.88. EGFR 96. Na/K 141/3.6. Rest of the CMP normal H/H 11.0/34.6. MCV 92. Platelets 375 TSH 1.01 normal    Review of Systems  Cardiovascular: Positive for palpitations. Negative for chest pain, dyspnea on exertion, leg swelling and syncope.         Vitals:   12/07/19 1004 12/07/19 1012  BP: (!) 128/107 (!) 123/91  Pulse: 84 94  SpO2: 96% 97%     Body mass index is 41.81 kg/m. Filed Weights   12/07/19 1004  Weight: 236 lb (107 kg)     Objective:   Physical Exam Vitals and nursing note reviewed.  Constitutional:      General: She is not in acute distress. Neck:     Vascular: No JVD.  Cardiovascular:     Rate and Rhythm: Normal rate and regular rhythm.     Heart sounds: Normal heart sounds. No murmur heard.   Pulmonary:     Effort: Pulmonary effort is normal.     Breath sounds: Normal breath sounds. No wheezing or rales.        Assessment & Recommendations:   39 y.o. African American female with hypertension, symptomatic PVC  Symptomatic PVC: Increased with from 60 mg twice daily to 240 mg  daily. Exercise treadmill stress test and echocardiogram.  Hypertension: Medication changes have not.  Sleep study.  Further recommendations after above testing.    Thank you for referring the patient to [place]. Please feel free to contact with any questions.   Nigel Mormon, MD Pager: [phone] Office: [phone]

## 2019-12-07 ENCOUNTER — Encounter: Payer: Self-pay | Admitting: Cardiology

## 2019-12-07 ENCOUNTER — Other Ambulatory Visit: Payer: Self-pay

## 2019-12-07 ENCOUNTER — Ambulatory Visit: Payer: Medicaid Other | Admitting: Cardiology

## 2019-12-07 VITALS — BP 123/91 | HR 94 | Ht 63.0 in | Wt 236.0 lb

## 2019-12-07 DIAGNOSIS — I493 Ventricular premature depolarization: Secondary | ICD-10-CM | POA: Insufficient documentation

## 2019-12-07 DIAGNOSIS — I10 Essential (primary) hypertension: Secondary | ICD-10-CM

## 2019-12-07 MED ORDER — DILTIAZEM HCL ER COATED BEADS 240 MG PO CP24: 240.0000 mg | ORAL_CAPSULE | Freq: Every day | ORAL | 3 refills | Status: DC

## 2019-12-11 ENCOUNTER — Ambulatory Visit: Payer: Medicaid Other

## 2019-12-11 ENCOUNTER — Other Ambulatory Visit: Payer: Self-pay

## 2019-12-11 ENCOUNTER — Other Ambulatory Visit: Payer: Medicaid Other

## 2019-12-11 DIAGNOSIS — I493 Ventricular premature depolarization: Secondary | ICD-10-CM

## 2019-12-13 ENCOUNTER — Institutional Professional Consult (permissible substitution): Payer: Medicaid Other | Admitting: Pulmonary Disease

## 2019-12-13 ENCOUNTER — Other Ambulatory Visit: Payer: Self-pay | Admitting: Cardiology

## 2019-12-13 DIAGNOSIS — I493 Ventricular premature depolarization: Secondary | ICD-10-CM

## 2019-12-14 ENCOUNTER — Other Ambulatory Visit (HOSPITAL_COMMUNITY)
Admission: RE | Admit: 2019-12-14 | Discharge: 2019-12-14 | Disposition: A | Discharge status: Home / Self Care | Payer: Medicaid Other | Source: Ambulatory Visit | Attending: Cardiology | Admitting: Cardiology

## 2019-12-14 DIAGNOSIS — Z01818 Encounter for other preprocedural examination: Secondary | ICD-10-CM | POA: Insufficient documentation

## 2019-12-14 DIAGNOSIS — Z20822 Contact with and (suspected) exposure to covid-19: Secondary | ICD-10-CM | POA: Insufficient documentation

## 2019-12-14 LAB — SARS CORONAVIRUS 2 (TAT 6-24 HRS): SARS Coronavirus 2: NEGATIVE

## 2019-12-17 ENCOUNTER — Other Ambulatory Visit: Payer: Self-pay

## 2019-12-17 ENCOUNTER — Ambulatory Visit: Payer: Medicaid Other

## 2019-12-17 DIAGNOSIS — I493 Ventricular premature depolarization: Secondary | ICD-10-CM

## 2020-01-07 ENCOUNTER — Ambulatory Visit: Payer: Medicaid Other | Admitting: Cardiology

## 2020-01-07 ENCOUNTER — Encounter: Payer: Self-pay | Admitting: Cardiology

## 2020-01-07 ENCOUNTER — Other Ambulatory Visit: Payer: Self-pay

## 2020-01-07 VITALS — BP 119/79 | HR 73 | Resp 16 | Ht 63.0 in | Wt 231.0 lb

## 2020-01-07 DIAGNOSIS — I493 Ventricular premature depolarization: Secondary | ICD-10-CM

## 2020-01-07 DIAGNOSIS — I10 Essential (primary) hypertension: Secondary | ICD-10-CM

## 2020-01-07 NOTE — Progress Notes (Signed)
Patient referred by Trey Sailors, PA for palpitations  Subjective:   [name], female    DOB: [birth year], 39 y.o.   MRN: [id number]   Chief Complaint  Patient presents with  . PVCs  . Follow-up     HPI  39 y.o. African American female with hypertension, palpitations  Stress test and echocardiogram results reviewed with the patient. Monitor pending return.   Initial consultation HPI 12/2019: Patient has been having symptoms of palpitations: 7 weeks.  Likely secondary to episode, she will experience lightheadedness, blurring her symptoms.  She did not experience chest pain, which improved after starting diltiazem.   Current Outpatient Medications on File Prior to Visit  Medication Sig Dispense Refill  . diltiazem (CARDIZEM CD) 240 MG 24 hr capsule Take 1 capsule (240 mg total) by mouth daily. 30 capsule 3  . hydrochlorothiazide (HYDRODIURIL) 25 MG tablet Take 25 mg by mouth daily.    Marland Kitchen linaclotide (LINZESS) 72 MCG capsule Take 72 mcg by mouth daily before breakfast.    . sertraline (ZOLOFT) 100 MG tablet Take 1 tablet (100 mg total) by mouth daily. 30 tablet 1   No current facility-administered medications on file prior to visit.    Cardiovascular and other pertinent studies:  Exercise treadmill stress test 12/17/2019: Exercise treadmill stress test performed using Bruce protocol.  Patient reached 5.9 METS, and 86% of age predicted maximum heart rate.  Exercise capacity was low.  No chest pain reported. Dyspnea and dizziness reported.  Normal heart rate and hemodynamic response.  Stress EKG demonstrated sinus tachycardia, no ST-T wave abnormalities, occasional PVC's. Low risk study. Recommend clinical correlation due to poor exercise capacity.   Echocardiogram 12/11/2019:  Normal LV systolic function with EF 54%. Left ventricle cavity is normal  in size. Normal global wall motion. Normal diastolic filling pattern.  Calculated EF 54%.  Normal  study.    EKG 11/14/2019:  1. Sinus Rhythm w/ frequent Ectopic Premature complexes moderate voltage criteria for LVH 2. Consider normal variant 3. Abnormal Rhythm ECG   Recent labs: 11/15/2019: Glucose 114, BUN/Cr 10/0.88. EGFR 96. Na/K 141/3.6. Rest of the CMP normal H/H 11.0/34.6. MCV 92. Platelets 375 TSH 1.01 normal    Review of Systems  Cardiovascular: Positive for palpitations. Negative for chest pain, dyspnea on exertion, leg swelling and syncope.         Vitals:   01/07/20 1626  BP: 119/79  Pulse: 73  Resp: 16  SpO2: 97%     Body mass index is 40.92 kg/m. Filed Weights   01/07/20 1626  Weight: 231 lb (104.8 kg)     Objective:   Physical Exam Vitals and nursing note reviewed.  Constitutional:      General: She is not in acute distress. Neck:     Vascular: No JVD.  Cardiovascular:     Rate and Rhythm: Normal rate and regular rhythm.     Heart sounds: Normal heart sounds. No murmur heard.   Pulmonary:     Effort: Pulmonary effort is normal.     Breath sounds: Normal breath sounds. No wheezing or rales.        Assessment & Recommendations:   39 y.o. African American female with hypertension, symptomatic PVC  Symptomatic PVC: Continue diltiazem 240 mg daily. Unremarkable Exercise treadmill stress test and echocardiogram, excpet for low exercise capacity. Monitor results pending.   Hypertension: Controlled/  F/u as needed, unless significant abnormalities noted on monitor.   Nigel Mormon, MD Pager: [phone]  Office: [phone]

## 2020-01-11 ENCOUNTER — Other Ambulatory Visit: Payer: Self-pay

## 2020-01-11 ENCOUNTER — Ambulatory Visit: Payer: Medicaid Other

## 2020-01-11 DIAGNOSIS — G4733 Obstructive sleep apnea (adult) (pediatric): Secondary | ICD-10-CM

## 2020-01-18 ENCOUNTER — Telehealth: Payer: Self-pay

## 2020-01-18 NOTE — Telephone Encounter (Signed)
Called pt to inform her if she could return the monitor. Informed pt she only needs to leave the monitor in her mail box. Pt understood.

## 2020-01-22 DIAGNOSIS — G4733 Obstructive sleep apnea (adult) (pediatric): Secondary | ICD-10-CM | POA: Diagnosis not present

## 2020-02-26 NOTE — Progress Notes (Deleted)
11/29/19- 41 yoF former smoker for sleep evaluation courtesy of Norva Riffle, [place] with concern of OSA. \Medical problem list includes HTN, Substance [place], ETOH, Hx Suicide Attempt, GERD, Depression, Hypokalemia Epworth score- 15 Body weight today- Covid vax- None Flu vax- standard today ------Pt is being referred by PCP due to having an irregular heart rate and due to this, they are wanting her to have a sleep study. Pt has been told that she snores. Pt says that she feels like she has not slept when she wakes up. Here with female companion, aware she snores loudly, some witnessed apnea, some daytime tiredness. Occ muscle relaxer at bedtime to help sleep. Little caffeine.  Denies ENT surgery, lung or other heart issues.  Wakes several times during the night. Denies complex parasomnias. CXR 11/03/19-  Normal exam.  02/2220-39 yoF former smoker follwed for OSA, complicated by HTN, Substance Use, ETOH, Hx Suicide Attempt, GERD, Depression, Hypokalemia HST- 01/11/20- AHI 7.1/ hr, desaturation to 81%, body weight 240 lbs Body weight today- Covid vax- Flu vax-   ROS-see HPI   + = positive Constitutional:    weight loss, night sweats, fevers, chills, +fatigue, lassitude. HEENT:    headaches, difficulty swallowing, tooth/dental problems, sore throat,       sneezing, itching, ear ache, nasal congestion, post nasal drip, snoring CV:    chest pain, orthopnea, PND, swelling in lower extremities, anasarca,                                  dizziness, palpitations Resp:   shortness of breath with exertion or at rest.                productive cough,   non-productive cough, coughing up of blood.              change in color of mucus.  wheezing.   Skin:    rash or lesions. GI:  No-   heartburn, indigestion, abdominal pain, nausea, vomiting, diarrhea,                 change in bowel habits, loss of appetite GU: dysuria, change in color of urine, no urgency or frequency.   flank pain. MS:   joint pain,  stiffness, decreased range of motion, back pain. Neuro-     nothing unusual Psych:  change in mood or affect.  depression or anxiety.   memory loss.  OBJ- Physical Exam General- Alert, Oriented, Affect-appropriate, Distress- none acute, + obese Skin- rash-none, lesions- none, excoriation- none Lymphadenopathy- none Head- atraumatic            Eyes- Gross vision intact, PERRLA, conjunctivae and secretions clear            Ears- Hearing, canals-normal            Nose- Clear, no-Septal dev, mucus, polyps, erosion, perforation             Throat- Mallampati IV , mucosa clear , drainage- none, tonsils- atrophic, + teeth Neck- flexible , trachea midline, no stridor , thyroid nl, carotid no bruit Chest - symmetrical excursion , unlabored           Heart/CV- RRR , no murmur , no gallop  , no rub, nl s1 s2                           - JVD- none , edema-  none, stasis changes- none, varices- none           Lung- clear to P&A, wheeze- none, cough- none , dullness-none, rub- none           Chest wall-  Abd-  Br/ Gen/ Rectal- Not done, not indicated Extrem- cyanosis- none, clubbing, none, atrophy- none, strength- nl Neuro- grossly intact to observation

## 2020-02-27 ENCOUNTER — Ambulatory Visit: Payer: Medicaid Other | Admitting: Internal Medicine

## 2020-03-02 NOTE — Progress Notes (Signed)
11/29/19- 32 yoF former smoker for sleep evaluation courtesy of Norva Riffle, [place] with concern of OSA. \Medical problem list includes HTN, Substance [place], ETOH, Hx Suicide Attempt, GERD, Depression, Hypokalemia Epworth score- 15 Body weight today- Covid vax- None Flu vax- standard today ------Pt is being referred by PCP due to having an irregular heart rate and due to this, they are wanting her to have a sleep study. Pt has been told that she snores. Pt says that she feels like she has not slept when she wakes up. Here with female companion, aware she snores loudly, some witnessed apnea, some daytime tiredness. Occ muscle relaxer at bedtime to help sleep. Little caffeine.  Denies ENT surgery, lung or other heart issues.  Wakes several times during the night. Denies complex parasomnias. CXR 11/03/19-  Normal exam.  02/2220-39 yoF former smoker follwed for OSA, complicated by HTN, Substance Use, ETOH, Hx Suicide Attempt, GERD, Depression, Hypokalemia, HTN, Daily ETOH and marijuana HST- 01/11/20- AHI 7.1/ hr, desaturation to 81%, body weight 240 lbs Body weight today- 222 lbs Covid vax- 1 Phizer Flu vax- had Sleep study reviewed and conservative measures including weight loss favored at this time. Consider repeating HST if appropriate in a year.  ROS-see HPI   + = positive Constitutional:    weight loss, night sweats, fevers, chills, +fatigue, lassitude. HEENT:    headaches, difficulty swallowing, tooth/dental problems, sore throat,       sneezing, itching, ear ache, nasal congestion, post nasal drip, snoring CV:    chest pain, orthopnea, PND, swelling in lower extremities, anasarca,                                   dizziness, palpitations Resp:   shortness of breath with exertion or at rest.                productive cough,   non-productive cough, coughing up of blood.              change in color of mucus.  wheezing.   Skin:    rash or lesions. GI:  No-   heartburn, indigestion, abdominal  pain, nausea, vomiting, diarrhea,                 change in bowel habits, loss of appetite GU: dysuria, change in color of urine, no urgency or frequency.   flank pain. MS:   joint pain, stiffness, decreased range of motion, back pain. Neuro-     nothing unusual Psych:  change in mood or affect.  depression or anxiety.   memory loss.  OBJ- Physical Exam General- Alert, Oriented, Affect-appropriate, Distress- none acute, + obese Skin- rash-none, lesions- none, excoriation- none Lymphadenopathy- none Head- atraumatic            Eyes- Gross vision intact, PERRLA, conjunctivae and secretions clear            Ears- Hearing, canals-normal            Nose- Clear, no-Septal dev, mucus, polyps, erosion, perforation             Throat- Mallampati IV , mucosa clear , drainage- none, tonsils- atrophic, + teeth Neck- flexible , trachea midline, no stridor , thyroid nl, carotid no bruit Chest - symmetrical excursion , unlabored           Heart/CV- RRR , no murmur , no gallop  , no rub, nl s1 s2                           -  JVD- none , edema- none, stasis changes- none, varices- none           Lung- clear to P&A, wheeze- none, cough- none , dullness-none, rub- none           Chest wall-  Abd-  Br/ Gen/ Rectal- Not done, not indicated Extrem- cyanosis- none, clubbing, none, atrophy- none, strength- nl Neuro- grossly intact to observation

## 2020-03-03 ENCOUNTER — Ambulatory Visit (INDEPENDENT_AMBULATORY_CARE_PROVIDER_SITE_OTHER): Payer: Medicaid Other | Admitting: Internal Medicine

## 2020-03-03 ENCOUNTER — Other Ambulatory Visit: Payer: Self-pay

## 2020-03-03 ENCOUNTER — Encounter: Payer: Self-pay | Admitting: Internal Medicine

## 2020-03-03 DIAGNOSIS — R0683 Snoring: Secondary | ICD-10-CM

## 2020-03-03 NOTE — Patient Instructions (Signed)
You have very mild sleep apnea. It will help if you make an effort to lose some weight and keep it off. I snoring and daytime tiredness bother you enough, we can talk again about wearing a CPAP mask or getting a mouthpiece.  We will get you back in a year to see how you are doing.

## 2020-04-01 ENCOUNTER — Emergency Department (HOSPITAL_COMMUNITY)
Admission: EM | Admit: 2020-04-01 | Discharge: 2020-04-01 | Disposition: A | Discharge status: Home / Self Care | Payer: Medicaid Other | Attending: Emergency Medicine | Admitting: Emergency Medicine

## 2020-04-01 ENCOUNTER — Encounter (HOSPITAL_COMMUNITY): Payer: Self-pay

## 2020-04-01 DIAGNOSIS — R197 Diarrhea, unspecified: Secondary | ICD-10-CM | POA: Insufficient documentation

## 2020-04-01 DIAGNOSIS — Z20822 Contact with and (suspected) exposure to covid-19: Secondary | ICD-10-CM | POA: Insufficient documentation

## 2020-04-01 DIAGNOSIS — J0111 Acute recurrent frontal sinusitis: Secondary | ICD-10-CM | POA: Diagnosis not present

## 2020-04-01 DIAGNOSIS — Z87891 Personal history of nicotine dependence: Secondary | ICD-10-CM | POA: Insufficient documentation

## 2020-04-01 DIAGNOSIS — J0110 Acute frontal sinusitis, unspecified: Secondary | ICD-10-CM

## 2020-04-01 DIAGNOSIS — Z79899 Other long term (current) drug therapy: Secondary | ICD-10-CM | POA: Diagnosis not present

## 2020-04-01 DIAGNOSIS — R112 Nausea with vomiting, unspecified: Secondary | ICD-10-CM | POA: Insufficient documentation

## 2020-04-01 DIAGNOSIS — I10 Essential (primary) hypertension: Secondary | ICD-10-CM | POA: Diagnosis not present

## 2020-04-01 LAB — URINALYSIS, ROUTINE W REFLEX MICROSCOPIC
Bilirubin Urine: NEGATIVE
Glucose, UA: NEGATIVE mg/dL
Hgb urine dipstick: NEGATIVE
Ketones, ur: NEGATIVE mg/dL
Nitrite: NEGATIVE
Protein, ur: NEGATIVE mg/dL
Specific Gravity, Urine: 1.024 (ref 1.005–1.030)
pH: 5 (ref 5.0–8.0)

## 2020-04-01 LAB — COMPREHENSIVE METABOLIC PANEL
ALT: 32 U/L (ref 0–44)
AST: 23 U/L (ref 15–41)
Albumin: 3.8 g/dL (ref 3.5–5.0)
Alkaline Phosphatase: 71 U/L (ref 38–126)
Anion gap: 8 (ref 5–15)
BUN: 16 mg/dL (ref 6–20)
CO2: 24 mmol/L (ref 22–32)
Calcium: 9.2 mg/dL (ref 8.9–10.3)
Chloride: 106 mmol/L (ref 98–111)
Creatinine, Ser: 0.99 mg/dL (ref 0.44–1.00)
GFR, Estimated: >60 mL/min (ref >60)
Glucose, Bld: 117 mg/dL — ABNORMAL HIGH (ref 70–99)
Potassium: 3.9 mmol/L (ref 3.5–5.1)
Sodium: 138 mmol/L (ref 135–145)
Total Bilirubin: 0.3 mg/dL (ref 0.3–1.2)
Total Protein: 7.6 g/dL (ref 6.5–8.1)

## 2020-04-01 LAB — CBC
HCT: 37.6 % (ref 36.0–46.0)
Hemoglobin: 12.5 g/dL (ref 12.0–15.0)
MCH: 30.4 pg (ref 26.0–34.0)
MCHC: 33.2 g/dL (ref 30.0–36.0)
MCV: 91.5 fL (ref 80.0–100.0)
Platelets: 260 10*3/uL (ref 150–400)
RBC: 4.11 MIL/uL (ref 3.87–5.11)
RDW: 14.3 % (ref 11.5–15.5)
WBC: 8.5 10*3/uL (ref 4.0–10.5)
nRBC: 0 % (ref 0.0–0.2)

## 2020-04-01 LAB — I-STAT BETA HCG BLOOD, ED (MC, WL, AP ONLY): I-stat hCG, quantitative: <5 m[IU]/mL (ref <5)

## 2020-04-01 LAB — LIPASE, BLOOD: Lipase: 53 U/L — ABNORMAL HIGH (ref 11–51)

## 2020-04-01 MED ORDER — PROCHLORPERAZINE EDISYLATE 10 MG/2ML IJ SOLN
10.0000 mg | Freq: Once | INTRAMUSCULAR | Status: AC
  Administered 2020-04-01: 10 mg via INTRAMUSCULAR
  Filled 2020-04-01: qty 2

## 2020-04-01 MED ORDER — KETOROLAC TROMETHAMINE 30 MG/ML IJ SOLN
30.0000 mg | Freq: Once | INTRAMUSCULAR | Status: AC
  Administered 2020-04-01: 30 mg via INTRAMUSCULAR
  Filled 2020-04-01: qty 1

## 2020-04-01 MED ORDER — AMOXICILLIN-POT CLAVULANATE 875-125 MG PO TABS
1.0000 | ORAL_TABLET | Freq: Once | ORAL | Status: AC
  Administered 2020-04-01: 1 via ORAL
  Filled 2020-04-01: qty 1

## 2020-04-01 MED ORDER — AMOXICILLIN-POT CLAVULANATE 875-125 MG PO TABS: 1.0000 | ORAL_TABLET | Freq: Two times a day (BID) | ORAL | 0 refills | Status: DC

## 2020-04-01 NOTE — ED Triage Notes (Signed)
Pt arrived via walk in, c/o n/v and diarrhea and headache x3 days. Denies any urinary sx. Denies any known sick contacts.

## 2020-04-01 NOTE — ED Provider Notes (Signed)
[facility]-EMERGENCY DEPT Provider Note   CSN: [id number] Arrival date & time: 04/01/20  0901     History Chief Complaint  Patient presents with  . Abdominal Pain    [name] is a 40 y.o. female with a past medical history significant for alcohol abuse, anemia, depression, GERD, hypertension, and history of migraines who presents to the ED due to nausea, vomiting, and diarrhea x3 days.  Patient also admits to a left-sided headache which she describes as "sinus pressure".  She also admits to intermittent pounding on the left side associated with photophobia.  Denies fever and chills.  Patient states pain is worse with palpation over left sinuses.  Patient denies changes to speech, unilateral weakness, dizziness, and visual changes. No treatment prior to arrival.  Patient also admits to nausea, vomiting, and diarrhea. Nausea and vomiting started on Saturday and resolved on Sunday. Endorses numerous episodes of diarrhea over the past few days. Denies hematemesis, hematochezia, melena admits to frequent marijuana use. Drinks 2 glasses of wine nightly. Last alcoholic beverage yesterday. Denies fever and chills.  Denies sick contacts or known Covid exposures.  She is unvaccinated with COVID-19.  Works as a LawyerCNA.  She admits to chronic nausea and vomiting and took promethazine with relief of symptoms. Admits to associated generalized abdominal pain. Denies urinary and vaginal symptoms. She is currently sexually active with no concerns for STIs.   History obtained from patient and past medical records. No interpreter used during encounter.      Past Medical History:  Diagnosis Date  . Acid reflux   . Alcohol abuse   . Anemia   . Depression   . GERD (gastroesophageal reflux disease)   . H/O suicide attempt   . Hypertension   . Vaginal Pap smear, abnormal     Patient Active Problem List   Diagnosis Date Noted  . 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 Snoring 11/29/2019   . Heart abnormality   . History of preterm delivery   . History of suicide attempt 10/23/2013  . Substance use disorder 10/23/2013  . History of depression 10/23/2013  . Abnormal Pap smear of cervix 10/23/2013  . Hypokalemia 08/08/2013  . Essential hypertension, benign 08/08/2013  . History of alcohol use 08/08/2013  . H/O alcoholic gastritis 08/08/2013    Past Surgical History:  Procedure Laterality Date  . LEEP       OB History    Gravida  5   Para  3   Term  3   Preterm  0   AB  2   Living  2     SAB  2   IAB      Ectopic      Multiple  0   Live Births  2           Family History  Problem Relation Age of Onset  . Heart disease Mother   . Hyperlipidemia Mother   . Hypertension Mother   . Stroke Mother   . Asthma Sister   . Asthma Son   . Heart disease Father   . Heart attack Maternal Grandmother     Social History   Tobacco Use  . Smoking status: Former Smoker    Packs/day: 0.25    Years: 1.00    Pack years: 0.25    Types: Cigarettes    Quit date: 12/07/1999    Years since quitting: 20.3  . Smokeless tobacco: Never Used  . Tobacco comment: smoked 3-4 years  Vaping Use  . Vaping Use: Never used  Substance Use Topics  . Alcohol use: Yes    Comment: Hx alcohol abuse; occassionally  . Drug use: No    Types: Marijuana    Comment: occasionally    Home Medications Prior to Admission medications   Medication Sig Start Date End Date Taking? Authorizing Provider  amoxicillin-clavulanate (AUGMENTIN) 875-125 MG tablet Take 1 tablet by mouth every 12 (twelve) hours. 04/01/20  Yes Eliodoro Gullett, Merla Riches, PA-C  diltiazem (CARDIZEM CD) 240 MG 24 hr capsule Take 1 capsule (240 mg total) by mouth daily. 12/07/19 03/06/20  Patwardhan, Anabel Bene, MD  folic acid (FOLVITE) 1 MG tablet Take 1 mg by mouth daily.    [provider]  hydrochlorothiazide (HYDRODIURIL) 25 MG tablet Take 25 mg by mouth daily. 07/25/19   [provider]   linaclotide (LINZESS) 72 MCG capsule Take 72 mcg by mouth daily before breakfast.    [provider]  sertraline (ZOLOFT) 100 MG tablet Take 1 tablet (100 mg total) by mouth daily. 04/19/14   Adam Phenix, MD    Allergies    Patient has no known allergies.  Review of Systems   Review of Systems  Constitutional: Negative for chills and fever.  HENT: Positive for sinus pressure.   Respiratory: Negative for shortness of breath.   Cardiovascular: Negative for chest pain.  Gastrointestinal: Positive for abdominal pain, diarrhea, nausea and vomiting.  Neurological: Positive for headaches. Negative for dizziness, facial asymmetry, speech difficulty and weakness.  All other systems reviewed and are negative.   Physical Exam Updated Vital Signs BP 129/81 (BP Location: Left Arm)   Pulse (!) 103   Temp 98.4 F (36.9 C) (Oral)   Resp 18   Ht 5\' 3"  (1.6 m)   Wt 101.6 kg   SpO2 95%   BMI 39.68 kg/m   Physical Exam Vitals and nursing note reviewed.  Constitutional:      General: She is not in acute distress.    Appearance: She is not toxic-appearing.  HENT:     Head: Normocephalic.     Right Ear: Tympanic membrane normal.     Left Ear: Tympanic membrane normal.     Mouth/Throat:     Comments: Tenderness throughout left sinuses.  Eyes:     Pupils: Pupils are equal, round, and reactive to light.  Neck:     Comments: No meningismus. Cardiovascular:     Rate and Rhythm: Normal rate and regular rhythm.     Pulses: Normal pulses.     Heart sounds: Normal heart sounds. No murmur heard. No friction rub. No gallop.   Pulmonary:     Effort: Pulmonary effort is normal.     Breath sounds: Normal breath sounds.  Abdominal:     General: Abdomen is flat. There is no distension.     Palpations: Abdomen is soft.     Tenderness: There is no abdominal tenderness. There is no guarding or rebound.     Comments: Abdomen soft, nondistended, nontender to palpation in all quadrants  without guarding or peritoneal signs. No rebound.   Musculoskeletal:        General: Normal range of motion.     Cervical back: Neck supple.  Skin:    General: Skin is warm and dry.  Neurological:     General: No focal deficit present.     Mental Status: She is alert.     Comments: Speech is clear, able to follow commands CN III-XII  intact Normal strength in upper and lower extremities bilaterally including dorsiflexion and plantar flexion, strong and equal grip strength Sensation grossly intact throughout Moves extremities without ataxia, coordination intact No pronator drift Ambulates without difficulty  Psychiatric:        Mood and Affect: Mood normal.        Behavior: Behavior normal.     ED Results / Procedures / Treatments   Labs (all labs ordered are listed, but only abnormal results are displayed) Labs Reviewed  LIPASE, BLOOD - Abnormal; Notable for the following components:      Result Value   Lipase 53 (*)    All other components within normal limits  COMPREHENSIVE METABOLIC PANEL - Abnormal; Notable for the following components:   Glucose, Bld 117 (*)    All other components within normal limits  URINALYSIS, ROUTINE W REFLEX MICROSCOPIC - Abnormal; Notable for the following components:   APPearance HAZY (*)    Leukocytes,Ua TRACE (*)    Bacteria, UA RARE (*)    All other components within normal limits  SARS CORONAVIRUS 2 (TAT 6-24 HRS)  URINE CULTURE  CBC  I-STAT BETA HCG BLOOD, ED ([facility], [facility], [facility] ONLY)    EKG None  Radiology No results found.  Procedures Procedures   Medications Ordered in ED Medications  amoxicillin-clavulanate (AUGMENTIN) 875-125 MG per tablet 1 tablet (1 tablet Oral Given 04/01/20 1420)  ketorolac (TORADOL) 30 MG/ML injection 30 mg (30 mg Intramuscular Given 04/01/20 1420)  prochlorperazine (COMPAZINE) injection 10 mg (10 mg Intramuscular Given 04/01/20 1420)    ED Course  I have reviewed the triage vital signs and the nursing  notes.  Pertinent labs & imaging results that were available during my care of the patient were reviewed by me and considered in my medical decision making (see chart for details).  Clinical Course as of 04/01/20 1505  Tue Apr 01, 2020  1339 Lipase(!): 53 [CA]  1339 Glucose(!): 117 [CA]    Clinical Course User Index [CA] Mannie Stabile, PA-C   MDM Rules/Calculators/A&P                         40 year old female presents to the ED due to left-sided headache, nausea, vomiting, diarrhea for the past few days.  Patient has a history of migraines and notes this feels different than her typical migraine. She notes it feels similar to her past sinus infection.  Denies visual changes, speech changes, unilateral weakness.  No head injury. 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 She is currently unvaccinated. Stable vitals. Patient in no acute distress and non-toxic appearing. Abdomen soft, nondistended, nontender.  Low suspicion for acute abdomen.  Normal neurological exam.  Low suspicion for [facility] or other emergent intracranial abnormalities. Tenderness of left sinuses. Suspect headache related to sinusitis. Augmentin given here in the ED. Migraine cocktail given. Routine labs and UA ordered at triage. COVID test added. Nausea, vomiting, and diarrhea related to possible gastroenteritis vs. COVID infection.   CMP reassuring with normal renal function no major electrolyte derangements.  Hyperglycemia at 117 with no anion gap.  Doubt DKA.  UA significant for trace leukocytes and rare bacteria.  No nitrites.  Patient denies any urinary symptoms.  Low suspicion for acute cystitis. Urine culture pending.  Lipase mildly elevated at 53.  No epigastric or right upper quadrant tenderness.  Low suspicion for pancreatitis.  CBC unremarkable no leukocytosis and normal hemoglobin.  Pregnancy  test negative.   3:02 PM reassessed patient at bedside and notes improvement in symptoms.  Patient discharged with Augmentin for sinusitis. Patient has nausea medication at home. COVID test pending. Advised patient to follow-up with PCP if symptoms don't improve over the next few days. Strict ED precautions discussed with patient. Patient states understanding and agrees to plan. Patient discharged home in no acute distress and stable vitals  [name] was evaluated in Emergency Department on 04/01/2020 for the symptoms described in the history of present illness. She was evaluated in the context of the global COVID-19 pandemic, which necessitated consideration that the patient might be at risk for infection with the SARS-CoV-2 virus that causes COVID-19. Institutional protocols and algorithms that pertain to the evaluation of patients at risk for COVID-19 are in a state of rapid change based on information released by regulatory bodies including the CDC and federal and state organizations. These policies and algorithms were followed during the patient's care in the ED. Final Clinical Impression(s) / ED Diagnoses Final diagnoses:  Acute non-recurrent frontal sinusitis  Nausea vomiting and diarrhea    Rx / DC Orders ED Discharge Orders         Ordered    amoxicillin-clavulanate (AUGMENTIN) 875-125 MG tablet  Every 12 hours        04/01/20 1503           Jesusita Oka 04/01/20 1523    Gerhard Munch, MD 04/01/20 [phone]

## 2020-04-01 NOTE — Discharge Instructions (Addendum)
As discussed, all your labs are reassuring today.  I suspect your headache is related to a sinus infection.  I am sending home with antibiotic.  Take as prescribed and finish all antibiotics.  Covid test is pending.  Continue to self quarantine until your results become available.  Continue to take your nausea medication as prescribed by your provider.  Return to the ER for new or worsening symptoms.

## 2020-04-01 NOTE — ED Notes (Signed)
An After Visit Summary was printed and given to the patient. Discharge instructions given and no further questions at this time.  

## 2020-04-02 LAB — SARS CORONAVIRUS 2 (TAT 6-24 HRS): SARS Coronavirus 2: NEGATIVE

## 2020-04-03 LAB — URINE CULTURE: Culture: >=100000 — AB

## 2020-04-06 ENCOUNTER — Other Ambulatory Visit: Payer: Self-pay | Admitting: Cardiology

## 2020-04-06 DIAGNOSIS — I493 Ventricular premature depolarization: Secondary | ICD-10-CM

## 2020-04-12 NOTE — Assessment & Plan Note (Signed)
Very mild OSA. Management discussed. Plan- encourage weight loss, sleep position off back. We can reassess in a year.

## 2021-04-22 ENCOUNTER — Other Ambulatory Visit: Payer: Self-pay

## 2021-04-22 ENCOUNTER — Emergency Department (HOSPITAL_COMMUNITY): Payer: Medicaid Other

## 2021-04-22 ENCOUNTER — Inpatient Hospital Stay (HOSPITAL_COMMUNITY)
Admission: EM | Admit: 2021-04-22 | Discharge: 2021-04-24 | DRG: 247 | Disposition: A | Discharge status: Home / Self Care | Payer: Medicaid Other | Attending: Cardiology | Admitting: Cardiology

## 2021-04-22 ENCOUNTER — Inpatient Hospital Stay (HOSPITAL_COMMUNITY)
Admission: EM | Disposition: A | Discharge status: Home / Self Care | Payer: Self-pay | Source: Home / Self Care | Attending: Cardiology

## 2021-04-22 DIAGNOSIS — Z9151 Personal history of suicidal behavior: Secondary | ICD-10-CM

## 2021-04-22 DIAGNOSIS — I10 Essential (primary) hypertension: Secondary | ICD-10-CM | POA: Diagnosis not present

## 2021-04-22 DIAGNOSIS — K219 Gastro-esophageal reflux disease without esophagitis: Secondary | ICD-10-CM | POA: Diagnosis not present

## 2021-04-22 DIAGNOSIS — I214 Non-ST elevation (NSTEMI) myocardial infarction: Secondary | ICD-10-CM | POA: Diagnosis not present

## 2021-04-22 DIAGNOSIS — F32A Depression, unspecified: Secondary | ICD-10-CM | POA: Diagnosis present

## 2021-04-22 DIAGNOSIS — Z87891 Personal history of nicotine dependence: Secondary | ICD-10-CM

## 2021-04-22 DIAGNOSIS — Z20822 Contact with and (suspected) exposure to covid-19: Secondary | ICD-10-CM | POA: Diagnosis present

## 2021-04-22 DIAGNOSIS — Z825 Family history of asthma and other chronic lower respiratory diseases: Secondary | ICD-10-CM

## 2021-04-22 DIAGNOSIS — Z823 Family history of stroke: Secondary | ICD-10-CM | POA: Diagnosis not present

## 2021-04-22 DIAGNOSIS — Z8249 Family history of ischemic heart disease and other diseases of the circulatory system: Secondary | ICD-10-CM

## 2021-04-22 DIAGNOSIS — I97630 Postprocedural hematoma of a circulatory system organ or structure following a cardiac catheterization: Secondary | ICD-10-CM | POA: Diagnosis not present

## 2021-04-22 DIAGNOSIS — I493 Ventricular premature depolarization: Secondary | ICD-10-CM | POA: Diagnosis present

## 2021-04-22 DIAGNOSIS — E876 Hypokalemia: Secondary | ICD-10-CM | POA: Diagnosis not present

## 2021-04-22 DIAGNOSIS — R7303 Prediabetes: Secondary | ICD-10-CM | POA: Diagnosis present

## 2021-04-22 DIAGNOSIS — I2510 Atherosclerotic heart disease of native coronary artery without angina pectoris: Secondary | ICD-10-CM | POA: Diagnosis present

## 2021-04-22 DIAGNOSIS — Y840 Cardiac catheterization as the cause of abnormal reaction of the patient, or of later complication, without mention of misadventure at the time of the procedure: Secondary | ICD-10-CM | POA: Diagnosis not present

## 2021-04-22 DIAGNOSIS — L818 Other specified disorders of pigmentation: Secondary | ICD-10-CM | POA: Diagnosis not present

## 2021-04-22 DIAGNOSIS — R778 Other specified abnormalities of plasma proteins: Secondary | ICD-10-CM

## 2021-04-22 DIAGNOSIS — E7800 Pure hypercholesterolemia, unspecified: Secondary | ICD-10-CM | POA: Diagnosis not present

## 2021-04-22 DIAGNOSIS — R079 Chest pain, unspecified: Secondary | ICD-10-CM

## 2021-04-22 HISTORY — PX: CORONARY STENT INTERVENTION: CATH118234

## 2021-04-22 HISTORY — PX: LEFT HEART CATH AND CORONARY ANGIOGRAPHY: CATH118249

## 2021-04-22 LAB — POCT I-STAT, CHEM 8
BUN: 13 mg/dL (ref 6–20)
Calcium, Ion: 1.19 mmol/L (ref 1.15–1.40)
Chloride: 106 mmol/L (ref 98–111)
Creatinine, Ser: 0.7 mg/dL (ref 0.44–1.00)
Glucose, Bld: 102 mg/dL — ABNORMAL HIGH (ref 70–99)
HCT: 33 % — ABNORMAL LOW (ref 36.0–46.0)
Hemoglobin: 11.2 g/dL — ABNORMAL LOW (ref 12.0–15.0)
Potassium: 3.4 mmol/L — ABNORMAL LOW (ref 3.5–5.1)
Sodium: 139 mmol/L (ref 135–145)
TCO2: 23 mmol/L (ref 22–32)

## 2021-04-22 LAB — RESP PANEL BY RT-PCR (FLU A&B, COVID) ARPGX2
Influenza A by PCR: NEGATIVE
Influenza B by PCR: NEGATIVE
SARS Coronavirus 2 by RT PCR: NEGATIVE

## 2021-04-22 LAB — HEPATIC FUNCTION PANEL
ALT: 23 U/L (ref 0–44)
AST: 25 U/L (ref 15–41)
Albumin: 3.6 g/dL (ref 3.5–5.0)
Alkaline Phosphatase: 69 U/L (ref 38–126)
Bilirubin, Direct: <0.1 mg/dL (ref 0.0–0.2)
Total Bilirubin: 0.1 mg/dL — ABNORMAL LOW (ref 0.3–1.2)
Total Protein: 7.2 g/dL (ref 6.5–8.1)

## 2021-04-22 LAB — ECHOCARDIOGRAM COMPLETE
Area-P 1/2: 4.86 cm2
Calc EF: 56.3 %
Height: 63 in
S' Lateral: 2.7 cm
Single Plane A2C EF: 57.2 %
Single Plane A4C EF: 55.6 %
Weight: 3776 oz

## 2021-04-22 LAB — D-DIMER, QUANTITATIVE: D-Dimer, Quant: 0.84 ug/mL-FEU — ABNORMAL HIGH (ref 0.00–0.50)

## 2021-04-22 LAB — CREATININE, SERUM
Creatinine, Ser: 0.98 mg/dL (ref 0.44–1.00)
GFR, Estimated: >60 mL/min (ref >60)

## 2021-04-22 LAB — PROTIME-INR
INR: 1 (ref 0.8–1.2)
Prothrombin Time: 12.9 seconds (ref 11.4–15.2)

## 2021-04-22 LAB — CBC
HCT: 37.2 % (ref 36.0–46.0)
Hemoglobin: 12.7 g/dL (ref 12.0–15.0)
MCH: 30.3 pg (ref 26.0–34.0)
MCHC: 34.1 g/dL (ref 30.0–36.0)
MCV: 88.8 fL (ref 80.0–100.0)
Platelets: 335 10*3/uL (ref 150–400)
RBC: 4.19 MIL/uL (ref 3.87–5.11)
RDW: 12.8 % (ref 11.5–15.5)
WBC: 8.2 10*3/uL (ref 4.0–10.5)
nRBC: 0 % (ref 0.0–0.2)

## 2021-04-22 LAB — POCT ACTIVATED CLOTTING TIME
Activated Clotting Time: 293 seconds
Activated Clotting Time: 305 seconds

## 2021-04-22 LAB — HEMOGLOBIN A1C
Hgb A1c MFr Bld: 6 % — ABNORMAL HIGH (ref 4.8–5.6)
Mean Plasma Glucose: 125.5 mg/dL

## 2021-04-22 LAB — POTASSIUM: Potassium: 3.3 mmol/L — ABNORMAL LOW (ref 3.5–5.1)

## 2021-04-22 LAB — HIV ANTIBODY (ROUTINE TESTING W REFLEX): HIV Screen 4th Generation wRfx: NONREACTIVE

## 2021-04-22 LAB — TROPONIN I (HIGH SENSITIVITY)
Troponin I (High Sensitivity): 29 ng/L — ABNORMAL HIGH (ref <18)
Troponin I (High Sensitivity): 9668 ng/L (ref <18)
Troponin I (High Sensitivity): 22926 ng/L (ref <18)

## 2021-04-22 LAB — BASIC METABOLIC PANEL
Anion gap: 14 (ref 5–15)
BUN: 18 mg/dL (ref 6–20)
CO2: 23 mmol/L (ref 22–32)
Calcium: 9.5 mg/dL (ref 8.9–10.3)
Chloride: 100 mmol/L (ref 98–111)
Creatinine, Ser: 1.26 mg/dL — ABNORMAL HIGH (ref 0.44–1.00)
GFR, Estimated: 56 mL/min — ABNORMAL LOW (ref >60)
Glucose, Bld: 163 mg/dL — ABNORMAL HIGH (ref 70–99)
Potassium: 2.6 mmol/L — CL (ref 3.5–5.1)
Sodium: 137 mmol/L (ref 135–145)

## 2021-04-22 LAB — BRAIN NATRIURETIC PEPTIDE: B Natriuretic Peptide: 8.2 pg/mL (ref 0.0–100.0)

## 2021-04-22 LAB — RAPID URINE DRUG SCREEN, HOSP PERFORMED
Amphetamines: NOT DETECTED
Barbiturates: NOT DETECTED
Benzodiazepines: NOT DETECTED
Cocaine: NOT DETECTED
Opiates: NOT DETECTED
Tetrahydrocannabinol: POSITIVE — AB

## 2021-04-22 LAB — I-STAT BETA HCG BLOOD, ED (MC, WL, AP ONLY): I-stat hCG, quantitative: <5 m[IU]/mL (ref <5)

## 2021-04-22 LAB — TSH: TSH: 0.506 u[IU]/mL (ref 0.350–4.500)

## 2021-04-22 LAB — HEPARIN LEVEL (UNFRACTIONATED): Heparin Unfractionated: >1.1 IU/mL — ABNORMAL HIGH (ref 0.30–0.70)

## 2021-04-22 LAB — LIPASE, BLOOD: Lipase: 39 U/L (ref 11–51)

## 2021-04-22 LAB — MAGNESIUM: Magnesium: 2 mg/dL (ref 1.7–2.4)

## 2021-04-22 LAB — PREGNANCY, URINE: Preg Test, Ur: NEGATIVE

## 2021-04-22 SURGERY — LEFT HEART CATH AND CORONARY ANGIOGRAPHY
Anesthesia: LOCAL

## 2021-04-22 MED ORDER — ATORVASTATIN CALCIUM 80 MG PO TABS
80.0000 mg | ORAL_TABLET | Freq: Every day | ORAL | Status: DC
  Administered 2021-04-22 – 2021-04-24 (×3): 80 mg via ORAL
  Filled 2021-04-22 (×3): qty 1

## 2021-04-22 MED ORDER — METOPROLOL TARTRATE 25 MG PO TABS: 25.0000 mg | ORAL_TABLET | Freq: Two times a day (BID) | ORAL | 1 refills | Status: DC

## 2021-04-22 MED ORDER — FENTANYL CITRATE (PF) 100 MCG/2ML IJ SOLN
INTRAMUSCULAR | Status: DC | PRN
  Administered 2021-04-22: 25 ug via INTRAVENOUS
  Administered 2021-04-22: 50 ug via INTRAVENOUS

## 2021-04-22 MED ORDER — POTASSIUM CHLORIDE CRYS ER 20 MEQ PO TBCR: 40.0000 meq | EXTENDED_RELEASE_TABLET | Freq: Once | ORAL | Status: DC

## 2021-04-22 MED ORDER — IOHEXOL 350 MG/ML SOLN
60.0000 mL | Freq: Once | INTRAVENOUS | Status: AC | PRN
  Administered 2021-04-22: 60 mL via INTRAVENOUS

## 2021-04-22 MED ORDER — TICAGRELOR 90 MG PO TABS
90.0000 mg | ORAL_TABLET | Freq: Two times a day (BID) | ORAL | Status: DC
  Administered 2021-04-23 – 2021-04-24 (×3): 90 mg via ORAL
  Filled 2021-04-22 (×3): qty 1

## 2021-04-22 MED ORDER — SODIUM CHLORIDE 0.9 % IV SOLN
INTRAVENOUS | Status: AC | PRN
  Administered 2021-04-22: 250 mL via INTRAVENOUS

## 2021-04-22 MED ORDER — SODIUM CHLORIDE 0.9% FLUSH: 3.0000 mL | INTRAVENOUS | Status: DC | PRN

## 2021-04-22 MED ORDER — SODIUM CHLORIDE 0.9 % WEIGHT BASED INFUSION
1.0000 mL/kg/h | INTRAVENOUS | Status: DC
  Administered 2021-04-22: 1 mL/kg/h via INTRAVENOUS

## 2021-04-22 MED ORDER — ASPIRIN EC 81 MG PO TBEC
81.0000 mg | DELAYED_RELEASE_TABLET | Freq: Every day | ORAL | Status: DC
  Administered 2021-04-23 – 2021-04-24 (×2): 81 mg via ORAL
  Filled 2021-04-22 (×2): qty 1

## 2021-04-22 MED ORDER — MIDAZOLAM HCL 2 MG/2ML IJ SOLN
INTRAMUSCULAR | Status: DC | PRN
Start: 2021-04-22 — End: 2021-04-22
  Administered 2021-04-22 (×2): 1 mg via INTRAVENOUS

## 2021-04-22 MED ORDER — TICAGRELOR 90 MG PO TABS: 90.0000 mg | ORAL_TABLET | Freq: Two times a day (BID) | ORAL | 1 refills | Status: DC

## 2021-04-22 MED ORDER — SODIUM CHLORIDE 0.9% FLUSH
3.0000 mL | Freq: Two times a day (BID) | INTRAVENOUS | Status: DC
  Administered 2021-04-23 (×2): 3 mL via INTRAVENOUS

## 2021-04-22 MED ORDER — ACETAMINOPHEN 325 MG PO TABS: 650.0000 mg | ORAL_TABLET | ORAL | Status: DC | PRN

## 2021-04-22 MED ORDER — IOHEXOL 350 MG/ML SOLN
INTRAVENOUS | Status: DC | PRN
  Administered 2021-04-22: 85 mL via INTRA_ARTERIAL

## 2021-04-22 MED ORDER — SODIUM CHLORIDE 0.9 % WEIGHT BASED INFUSION
3.0000 mL/kg/h | INTRAVENOUS | Status: AC
  Administered 2021-04-22: 3 mL/kg/h via INTRAVENOUS

## 2021-04-22 MED ORDER — VERAPAMIL HCL 2.5 MG/ML IV SOLN
INTRAVENOUS | Status: DC | PRN
Start: 2021-04-22 — End: 2021-04-22
  Administered 2021-04-22: 2 mg via INTRA_ARTERIAL

## 2021-04-22 MED ORDER — HEPARIN SODIUM (PORCINE) 1000 UNIT/ML IJ SOLN
INTRAMUSCULAR | Status: DC | PRN
  Administered 2021-04-22: 2000 [IU] via INTRAVENOUS
  Administered 2021-04-22: 6000 [IU] via INTRAVENOUS
  Administered 2021-04-22: 5000 [IU] via INTRAVENOUS

## 2021-04-22 MED ORDER — LACTATED RINGERS IV BOLUS
1000.0000 mL | Freq: Once | INTRAVENOUS | Status: AC
  Administered 2021-04-22: 1000 mL via INTRAVENOUS

## 2021-04-22 MED ORDER — FENTANYL CITRATE PF 50 MCG/ML IJ SOSY
PREFILLED_SYRINGE | INTRAMUSCULAR | Status: AC
  Filled 2021-04-22: qty 1

## 2021-04-22 MED ORDER — MIDAZOLAM HCL 2 MG/2ML IJ SOLN
INTRAMUSCULAR | Status: AC
  Filled 2021-04-22: qty 2

## 2021-04-22 MED ORDER — SODIUM CHLORIDE 0.9 % IV SOLN: 250.0000 mL | INTRAVENOUS | Status: DC | PRN

## 2021-04-22 MED ORDER — HEPARIN BOLUS VIA INFUSION
4000.0000 [IU] | Freq: Once | INTRAVENOUS | Status: AC
  Administered 2021-04-22: 4000 [IU] via INTRAVENOUS
  Filled 2021-04-22: qty 4000

## 2021-04-22 MED ORDER — FENTANYL CITRATE PF 50 MCG/ML IJ SOSY
50.0000 ug | PREFILLED_SYRINGE | Freq: Once | INTRAMUSCULAR | Status: AC
Start: 2021-04-22 — End: 2021-04-22
  Administered 2021-04-22: 50 ug via INTRAVENOUS

## 2021-04-22 MED ORDER — ASPIRIN 81 MG PO CHEW
81.0000 mg | CHEWABLE_TABLET | ORAL | Status: AC
  Administered 2021-04-22: 81 mg via ORAL
  Filled 2021-04-22: qty 1

## 2021-04-22 MED ORDER — POTASSIUM CHLORIDE 10 MEQ/100ML IV SOLN: 10.0000 meq | INTRAVENOUS | Status: DC

## 2021-04-22 MED ORDER — MAGNESIUM SULFATE 2 GM/50ML IV SOLN
2.0000 g | Freq: Once | INTRAVENOUS | Status: AC
  Administered 2021-04-22: 2 g via INTRAVENOUS
  Filled 2021-04-22: qty 50

## 2021-04-22 MED ORDER — ONDANSETRON HCL 4 MG/2ML IJ SOLN
4.0000 mg | Freq: Once | INTRAMUSCULAR | Status: AC
  Administered 2021-04-22: 4 mg via INTRAVENOUS
  Filled 2021-04-22: qty 2

## 2021-04-22 MED ORDER — ASPIRIN 81 MG PO CHEW
243.0000 mg | CHEWABLE_TABLET | Freq: Once | ORAL | Status: AC
  Administered 2021-04-22: 243 mg via ORAL

## 2021-04-22 MED ORDER — HEPARIN (PORCINE) IN NACL 1000-0.9 UT/500ML-% IV SOLN
INTRAVENOUS | Status: DC | PRN
  Administered 2021-04-22 (×2): 500 mL

## 2021-04-22 MED ORDER — TICAGRELOR 90 MG PO TABS
ORAL_TABLET | ORAL | Status: DC | PRN
  Administered 2021-04-22: 180 mg via ORAL

## 2021-04-22 MED ORDER — NITROGLYCERIN 1 MG/10 ML FOR IR/CATH LAB
INTRA_ARTERIAL | Status: AC
  Filled 2021-04-22: qty 10

## 2021-04-22 MED ORDER — HEPARIN (PORCINE) 25000 UT/250ML-% IV SOLN
1000.0000 [IU]/h | INTRAVENOUS | Status: DC
Start: 2021-04-22 — End: 2021-04-22
  Administered 2021-04-22: 1000 [IU]/h via INTRAVENOUS
  Filled 2021-04-22: qty 250

## 2021-04-22 MED ORDER — ONDANSETRON HCL 4 MG/2ML IJ SOLN
4.0000 mg | Freq: Four times a day (QID) | INTRAMUSCULAR | Status: DC | PRN
  Filled 2021-04-22: qty 2

## 2021-04-22 MED ORDER — HEPARIN (PORCINE) IN NACL 1000-0.9 UT/500ML-% IV SOLN
INTRAVENOUS | Status: AC
Start: 2021-04-22 — End: ?
  Filled 2021-04-22: qty 500

## 2021-04-22 MED ORDER — TICAGRELOR 90 MG PO TABS
ORAL_TABLET | ORAL | Status: AC
  Filled 2021-04-22: qty 2

## 2021-04-22 MED ORDER — FENTANYL CITRATE (PF) 100 MCG/2ML IJ SOLN
INTRAMUSCULAR | Status: AC
  Filled 2021-04-22: qty 2

## 2021-04-22 MED ORDER — NITROGLYCERIN 0.4 MG SL SUBL
0.4000 mg | SUBLINGUAL_TABLET | SUBLINGUAL | Status: DC | PRN
  Administered 2021-04-22: 0.4 mg via SUBLINGUAL
  Filled 2021-04-22: qty 1

## 2021-04-22 MED ORDER — LABETALOL HCL 5 MG/ML IV SOLN: 10.0000 mg | INTRAVENOUS | Status: AC | PRN

## 2021-04-22 MED ORDER — METOPROLOL TARTRATE 12.5 MG HALF TABLET: 12.5000 mg | ORAL_TABLET | Freq: Two times a day (BID) | ORAL | Status: DC

## 2021-04-22 MED ORDER — ASPIRIN 300 MG RE SUPP: 300.0000 mg | RECTAL | Status: DC

## 2021-04-22 MED ORDER — HEPARIN SODIUM (PORCINE) 1000 UNIT/ML IJ SOLN
INTRAMUSCULAR | Status: AC
  Filled 2021-04-22: qty 10

## 2021-04-22 MED ORDER — ASPIRIN 81 MG PO CHEW: 81.0000 mg | CHEWABLE_TABLET | ORAL | Status: DC

## 2021-04-22 MED ORDER — ONDANSETRON HCL 4 MG/2ML IJ SOLN
4.0000 mg | Freq: Four times a day (QID) | INTRAMUSCULAR | Status: DC | PRN
  Administered 2021-04-22 – 2021-04-24 (×6): 4 mg via INTRAVENOUS
  Filled 2021-04-22 (×7): qty 2

## 2021-04-22 MED ORDER — POTASSIUM CHLORIDE CRYS ER 20 MEQ PO TBCR
40.0000 meq | EXTENDED_RELEASE_TABLET | Freq: Once | ORAL | Status: AC
  Administered 2021-04-22: 40 meq via ORAL
  Filled 2021-04-22: qty 2

## 2021-04-22 MED ORDER — HYDRALAZINE HCL 20 MG/ML IJ SOLN: 10.0000 mg | INTRAMUSCULAR | Status: AC | PRN

## 2021-04-22 MED ORDER — LISINOPRIL 2.5 MG PO TABS
2.5000 mg | ORAL_TABLET | Freq: Every day | ORAL | Status: DC
Start: 2021-04-23 — End: 2021-04-24
  Administered 2021-04-23 – 2021-04-24 (×2): 2.5 mg via ORAL
  Filled 2021-04-22 (×2): qty 1

## 2021-04-22 MED ORDER — FENTANYL CITRATE PF 50 MCG/ML IJ SOSY: 25.0000 ug | PREFILLED_SYRINGE | Freq: Once | INTRAMUSCULAR | Status: AC

## 2021-04-22 MED ORDER — SODIUM CHLORIDE 0.9% FLUSH
3.0000 mL | Freq: Two times a day (BID) | INTRAVENOUS | Status: DC
  Administered 2021-04-23 – 2021-04-24 (×4): 3 mL via INTRAVENOUS

## 2021-04-22 MED ORDER — FENTANYL CITRATE PF 50 MCG/ML IJ SOSY
PREFILLED_SYRINGE | INTRAMUSCULAR | Status: AC
  Administered 2021-04-22: 25 ug via INTRAVENOUS
  Filled 2021-04-22: qty 1

## 2021-04-22 MED ORDER — ASPIRIN 81 MG PO TBEC: 81.0000 mg | DELAYED_RELEASE_TABLET | Freq: Every day | ORAL | 1 refills | Status: DC

## 2021-04-22 MED ORDER — HEPARIN (PORCINE) IN NACL 1000-0.9 UT/500ML-% IV SOLN
INTRAVENOUS | Status: AC
  Filled 2021-04-22: qty 500

## 2021-04-22 MED ORDER — FENTANYL CITRATE PF 50 MCG/ML IJ SOSY
25.0000 ug | PREFILLED_SYRINGE | Freq: Once | INTRAMUSCULAR | Status: AC
Start: 2021-04-22 — End: 2021-04-22
  Administered 2021-04-22: 25 ug via INTRAVENOUS

## 2021-04-22 MED ORDER — POTASSIUM CHLORIDE 10 MEQ/100ML IV SOLN
10.0000 meq | INTRAVENOUS | Status: DC
Start: 2021-04-22 — End: 2021-04-22
  Administered 2021-04-22: 10 meq via INTRAVENOUS
  Filled 2021-04-22: qty 100

## 2021-04-22 MED ORDER — ATORVASTATIN CALCIUM 80 MG PO TABS: 80.0000 mg | ORAL_TABLET | Freq: Every day | ORAL | 1 refills | Status: DC

## 2021-04-22 MED ORDER — VERAPAMIL HCL 2.5 MG/ML IV SOLN
INTRAVENOUS | Status: AC
  Filled 2021-04-22: qty 4

## 2021-04-22 MED ORDER — PROCHLORPERAZINE MALEATE 5 MG PO TABS: 5.0000 mg | ORAL_TABLET | Freq: Once | ORAL | Status: DC

## 2021-04-22 MED ORDER — POTASSIUM CHLORIDE 10 MEQ/100ML IV SOLN
10.0000 meq | INTRAVENOUS | Status: AC
  Administered 2021-04-22: 10 meq via INTRAVENOUS
  Filled 2021-04-22: qty 100

## 2021-04-22 MED ORDER — ASPIRIN 81 MG PO CHEW
324.0000 mg | CHEWABLE_TABLET | ORAL | Status: DC
  Filled 2021-04-22: qty 4

## 2021-04-22 MED ORDER — METOPROLOL TARTRATE 25 MG PO TABS
25.0000 mg | ORAL_TABLET | Freq: Two times a day (BID) | ORAL | Status: DC
  Administered 2021-04-23 – 2021-04-24 (×3): 25 mg via ORAL
  Filled 2021-04-22 (×4): qty 1

## 2021-04-22 MED ORDER — LISINOPRIL 2.5 MG PO TABS: 2.5000 mg | ORAL_TABLET | Freq: Every day | ORAL | 1 refills | Status: DC

## 2021-04-22 MED ORDER — NITROGLYCERIN 1 MG/10 ML FOR IR/CATH LAB
INTRA_ARTERIAL | Status: DC | PRN
  Administered 2021-04-22: 100 ug via INTRACORONARY

## 2021-04-22 MED ORDER — LIDOCAINE HCL (PF) 1 % IJ SOLN
INTRAMUSCULAR | Status: DC | PRN
  Administered 2021-04-22: 2 mL

## 2021-04-22 MED ORDER — NITROGLYCERIN 0.4 MG SL SUBL: 0.4000 mg | SUBLINGUAL_TABLET | SUBLINGUAL | 1 refills | Status: DC | PRN

## 2021-04-22 MED ORDER — VERAPAMIL HCL 2.5 MG/ML IV SOLN
INTRAVENOUS | Status: DC | PRN
  Administered 2021-04-22 (×2): 10 mL via INTRA_ARTERIAL

## 2021-04-22 MED ORDER — LIDOCAINE HCL (PF) 1 % IJ SOLN
INTRAMUSCULAR | Status: AC
Start: 2021-04-22 — End: ?
  Filled 2021-04-22: qty 30

## 2021-04-22 MED ORDER — SODIUM CHLORIDE 0.9 % IV SOLN: INTRAVENOUS | Status: AC

## 2021-04-22 SURGICAL SUPPLY — 20 items
BALLN SAPPHIRE 3.0X15 (BALLOONS) ×2
BALLN SAPPHIRE ~~LOC~~ 3.25X8 (BALLOONS) ×1 IMPLANT
BALLOON SAPPHIRE 3.0X15 (BALLOONS) IMPLANT
CATH 5FR JL3.5 JR4 ANG PIG MP (CATHETERS) ×1 IMPLANT
CATH LAUNCHER 5F EBU3.0 (CATHETERS) IMPLANT
CATH LAUNCHER 6FR EBU 3 (CATHETERS) ×1 IMPLANT
CATHETER LAUNCHER 5F EBU3.0 (CATHETERS) ×2
DEVICE RAD COMP TR BAND LRG (VASCULAR PRODUCTS) ×1 IMPLANT
GLIDESHEATH SLEND A-KIT 6F 22G (SHEATH) ×1 IMPLANT
GLIDESHEATH SLEND SS 6F .021 (SHEATH) ×1 IMPLANT
GUIDEWIRE INQWIRE 1.5J.035X260 (WIRE) IMPLANT
INQWIRE 1.5J .035X260CM (WIRE) ×4
KIT ENCORE 26 ADVANTAGE (KITS) ×1 IMPLANT
KIT HEART LEFT (KITS) ×2 IMPLANT
PACK CARDIAC CATHETERIZATION (CUSTOM PROCEDURE TRAY) ×2 IMPLANT
STENT ONYX FRONTIER 3.0X18 (Permanent Stent) ×1 IMPLANT
TRANSDUCER W/STOPCOCK (MISCELLANEOUS) ×2 IMPLANT
TUBING CIL FLEX 10 FLL-RA (TUBING) ×2 IMPLANT
VALVE COPILOT STAT (MISCELLANEOUS) ×1 IMPLANT
WIRE ASAHI PROWATER 180CM (WIRE) ×1 IMPLANT

## 2021-04-22 NOTE — ED Provider Notes (Signed)
[facility] EMERGENCY DEPARTMENT Provider Note   CSN: [id number] Arrival date & time: 04/22/21  5498     History Chief Complaint  Patient presents with   Chest Pain    [name] is a 41 y.o. female with reported history of A-fib?,  Hypertension, multifocal PVCs presents to the emergency department for evaluation of chest pain and shortness of breath since sometime early this morning.  She reports the pain was sharp and she was having some chest tightness that was radiating down her left arm.  She was having some nausea and a few episodes of vomiting.  She reports she had an episode of diarrhea as well.  She reports diaphoresis and chills and some lightheadedness.  She denies any recent URI symptoms or any abdominal pain.  She denies any weakness in any of her extremities.  She denies any headache or any visual changes.  She does have a cardiologist for these PVCs and reports compliancy to her medications.  She is unsure of any family history of any cardiac deaths before the age of 75 and her family, but she does mention that heart disease runs in her family but is unable to tell me what kind or at what ages.   Chest Pain Associated symptoms: diaphoresis, nausea, shortness of breath and vomiting   Associated symptoms: no abdominal pain, no cough, no fever and no palpitations       Home Medications Prior to Admission medications   Medication Sig Start Date End Date Taking? Authorizing Provider  amoxicillin-clavulanate (AUGMENTIN) 875-125 MG tablet Take 1 tablet by mouth every 12 (twelve) hours. 04/01/20   Mannie Stabile, PA-C  diltiazem (CARDIZEM CD) 240 MG 24 hr capsule TAKE 1 CAPSULE(240 MG) BY MOUTH DAILY 04/07/20   Patwardhan, Anabel Bene, MD  folic acid (FOLVITE) 1 MG tablet Take 1 mg by mouth daily.    [provider]  hydrochlorothiazide (HYDRODIURIL) 25 MG tablet Take 25 mg by mouth daily. 07/25/19   [provider]  linaclotide (LINZESS)  72 MCG capsule Take 72 mcg by mouth daily before breakfast.    [provider]  sertraline (ZOLOFT) 100 MG tablet Take 1 tablet (100 mg total) by mouth daily. 04/19/14   Adam Phenix, MD      Allergies    Patient has no known allergies.    Review of Systems   Review of Systems  Constitutional:  Positive for chills and diaphoresis. Negative for fever.  HENT:  Negative for congestion and rhinorrhea.   Respiratory:  Positive for shortness of breath. Negative for cough.   Cardiovascular:  Positive for chest pain. Negative for palpitations.  Gastrointestinal:  Positive for diarrhea, nausea and vomiting. Negative for abdominal pain.  Neurological:  Positive for light-headedness.   Physical Exam Updated Vital Signs BP 106/79    Pulse 77    Temp (!) 97.3 F (36.3 C)    Resp (!) 23    Ht 5\' 3"  (1.6 m)    Wt 107 kg    SpO2 97%    BMI 41.81 kg/m  Physical Exam Vitals and nursing note reviewed.  Constitutional:      Appearance: She is ill-appearing and diaphoretic.     Comments: Patient is diffusely diaphoretic and has the EKG leads sliding off of her due to her sweat.  Eyes:     Extraocular Movements: Extraocular movements intact.     Pupils: Pupils are equal, round, and reactive to light.  Cardiovascular:  Rate and Rhythm: Normal rate.     Pulses:          Carotid pulses are 2+ on the right side and 2+ on the left side.      Radial pulses are 2+ on the right side and 2+ on the left side.       Dorsalis pedis pulses are 2+ on the right side and 2+ on the left side.       Posterior tibial pulses are 2+ on the right side and 2+ on the left side.     Heart sounds: Normal heart sounds.     Comments: Occasional PVCs palpated at radial pulse Pulmonary:     Effort: Pulmonary effort is normal. No accessory muscle usage or respiratory distress.     Comments: Clear to auscultation bilaterally.  Patient is not having any respiratory distress, accessory muscle use, nasal flaring,  tripoding, or cyanosis present.  Patient was initially tachypnea but was speaking full sentences with ease.  She was satting 97% on room air. Chest:     Chest wall: No deformity.  Abdominal:     Palpations: Abdomen is soft.     Tenderness: There is no abdominal tenderness. There is no guarding or rebound.  Skin:    Capillary Refill: Capillary refill takes less than 2 seconds.  Neurological:     Mental Status: She is alert.    ED Results / Procedures / Treatments   Labs (all labs ordered are listed, but only abnormal results are displayed) Labs Reviewed  CBC  BASIC METABOLIC PANEL  I-STAT BETA HCG BLOOD, ED ([facility], [facility], [facility] ONLY)  TROPONIN I (HIGH SENSITIVITY)    EKG EKG Interpretation  Date/Time:  Wednesday April 22 2021 06:26:10 EST Ventricular Rate:  84 PR Interval:  181 QRS Duration: 90 QT Interval:  399 QTC Calculation: 472 R Axis:   47 Text Interpretation: Sinus rhythm Ventricular premature complex Abnormal R-wave progression, early transition Nonspecific repol abnormality, diffuse leads When compared with ECG of 11/03/2019, REPOLARIZATION ABNORMALITY is now present Confirmed by Delora Fuel ([id number]) on 04/22/2021 6:31:14 AM  Radiology DG Chest 2 View  Result Date: 04/22/2021 CLINICAL DATA:  41 year old female with chest pain, shortness of breath, nausea vomiting. EXAM: CHEST - 2 VIEW COMPARISON:  Portable chest 11/03/2019 and earlier. FINDINGS: Semi upright AP and lateral views of the chest at 0645 hours. Lower lung volumes compared to 2021. Mediastinal contours remain normal. Visualized tracheal air column is within normal limits. Both lungs remain clear. No pneumothorax or pleural effusion. No acute osseous abnormality identified. Negative visible bowel gas pattern. IMPRESSION: Lower lung volumes.  No cardiopulmonary abnormality. Electronically Signed   By: Genevie Ann M.D.   On: 04/22/2021 07:23   CT Angio Chest PE W and/or Wo Contrast  Result Date: 04/22/2021 CLINICAL DATA:   Positive D-dimer, chest pain. EXAM: CT ANGIOGRAPHY CHEST WITH CONTRAST TECHNIQUE: Multidetector CT imaging of the chest was performed using the standard protocol during bolus administration of intravenous contrast. Multiplanar CT image reconstructions and MIPs were obtained to evaluate the vascular anatomy. RADIATION DOSE REDUCTION: This exam was performed according to the departmental dose-optimization program which includes automated exposure control, adjustment of the mA and/or kV according to patient size and/or use of iterative reconstruction technique. CONTRAST:  19mL OMNIPAQUE IOHEXOL 350 MG/ML SOLN COMPARISON:  None. FINDINGS: Cardiovascular: Satisfactory opacification of the pulmonary arteries to the segmental level. No evidence of pulmonary embolism. Normal heart size. No pericardial effusion. Mediastinum/Nodes: No enlarged mediastinal, hilar, or  axillary lymph nodes. Thyroid gland, trachea, and esophagus demonstrate no significant findings. Lungs/Pleura: Lungs are clear. No pleural effusion or pneumothorax. Upper Abdomen: No acute abnormality. Musculoskeletal: No chest wall abnormality. No acute or significant osseous findings. Review of the MIP images confirms the above findings. IMPRESSION: No definite evidence of pulmonary embolus. No definite abnormality seen in the chest. Electronically Signed   By: Marijo Conception M.D.   On: 04/22/2021 10:40     Procedures .Critical Care Performed by: Sherrell Puller, PA-C Authorized by: Sherrell Puller, PA-C   Critical care provider statement:    Critical care time (minutes):  30   Critical care was necessary to treat or prevent imminent or life-threatening deterioration of the following conditions:  Cardiac failure   Critical care was time spent personally by me on the following activities:  Development of treatment plan with patient or surrogate, discussions with consultants, evaluation of patient's response to treatment, examination of patient, ordering  and review of laboratory studies, ordering and review of radiographic studies, ordering and performing treatments and interventions, pulse oximetry, re-evaluation of patient's condition, review of old charts and obtaining history from patient or surrogate   Care discussed with: admitting provider     Medications Ordered in ED Medications  sodium chloride flush (NS) 0.9 % injection 3 mL (has no administration in time range)  sodium chloride flush (NS) 0.9 % injection 3 mL (has no administration in time range)  0.9 %  sodium chloride infusion (has no administration in time range)  0.9% sodium chloride infusion (0 mL/kg/hr  107 kg Intravenous Stopped 04/22/21 1249)  heparin ADULT infusion 100 units/mL (25000 units/249mL) (1,000 Units/hr Intravenous New Bag/Given 04/22/21 1225)  potassium chloride 10 mEq in 100 mL IVPB (10 mEq Intravenous New Bag/Given 04/22/21 1528)  aspirin EC tablet 81 mg ( Oral MAR Unhold 04/22/21 1831)  nitroGLYCERIN (NITROSTAT) SL tablet 0.4 mg ( Sublingual MAR Unhold 04/22/21 1831)  atorvastatin (LIPITOR) tablet 80 mg ( Oral MAR Unhold 04/22/21 1831)  prochlorperazine (COMPAZINE) tablet 5 mg ( Oral MAR Unhold 04/22/21 1831)  labetalol (NORMODYNE) injection 10 mg (has no administration in time range)  hydrALAZINE (APRESOLINE) injection 10 mg (has no administration in time range)  acetaminophen (TYLENOL) tablet 650 mg (has no administration in time range)  ondansetron (ZOFRAN) injection 4 mg (has no administration in time range)  0.9 %  sodium chloride infusion (has no administration in time range)  sodium chloride flush (NS) 0.9 % injection 3 mL (has no administration in time range)  sodium chloride flush (NS) 0.9 % injection 3 mL (has no administration in time range)  0.9 %  sodium chloride infusion (has no administration in time range)  ticagrelor (BRILINTA) tablet 90 mg (has no administration in time range)  lisinopril (ZESTRIL) tablet 2.5 mg (has no administration in time  range)  metoprolol tartrate (LOPRESSOR) tablet 25 mg (has no administration in time range)  lactated ringers bolus 1,000 mL (0 mLs Intravenous Stopped 04/22/21 0916)  ondansetron (ZOFRAN) injection 4 mg (4 mg Intravenous Given 04/22/21 0819)  potassium chloride SA (KLOR-CON M) CR tablet 40 mEq (40 mEq Oral Given 04/22/21 0948)  iohexol (OMNIPAQUE) 350 MG/ML injection 60 mL (60 mLs Intravenous Contrast Given 04/22/21 1028)  aspirin chewable tablet 81 mg (81 mg Oral Given 04/22/21 1224)  heparin bolus via infusion 4,000 Units (4,000 Units Intravenous Bolus from Bag 04/22/21 1226)  magnesium sulfate IVPB 2 g 50 mL (2 g Intravenous New Bag/Given 04/22/21 1527)  aspirin chewable tablet 243  mg (243 mg Oral Given 04/22/21 1531)  0.9 %  sodium chloride infusion (250 mLs Intravenous New Bag/Given 04/22/21 1720)    ED Course/ Medical Decision Making/ A&P                           Medical Decision Making Amount and/or Complexity of Data Reviewed Labs: ordered. Radiology: ordered.  Risk Prescription drug management. Decision regarding hospitalization.   40 year old female with questionable history of A-fib, hypertension, PVCs presents emerged department for evaluation of chest pain and shortness of breath onset this morning as well as radiation down the left arm with nausea and vomiting.  Vital signs show normotension, normal heart rate, afebrile, satting well on room air.  She is slightly tachypneic, but is able to speak in full sentences with ease.  On my initial exam, the patient was profusely diaphoretic spreading from her head and on her chest with EKG leads not able to stick into her chest.  She was not comfortable appearing.  She had normal heart sounds with a normal heart rate she occasionally had some PVCs as I was palpating her pulses throughout and correlated with monitor.  Her lungs are clear to auscultation bilaterally.  At this time, I have concern for ACS, dissection, PE, pneumonia, sepsis.  I  discussed with my attending physician who also assessed at bedside.  Basic labs ordered and troponins as well as a D-dimer for concern for PE.  I independently reviewed and interpreted the patient's labs and imaging.  CBC shows no signs of anemia or infection.  BMP is pertinent for hypokalemia at 2.6.  Patient has elevated glucose at 163 elevated creatinine 1.26 from 0.99 over a year ago.  Hepatitis function panel shows mildly decreased total bili at 0.1 otherwise no abnormal LFTs.  PT/INR normal.  Lipase normal.  Negative pregnancy test.  Initial troponin elevated 29. D-dimer elevated at 0.84.  Given this, will order CTA of chest to rule out any PE. CXR shows low lung volumes, otherwise normal.   The patient was given 1 L LR, Zofran, 40 mill equivalents of potassium orally, and 10 mill equivalents of potassium IV.  The patient was having burning with the potassium in the small peripheral IV near her wrist.  She was able to get 10 mill equivalents before we discontinued.  Her second troponin and CT were delayed due to poor IV access.  A stat IV team consult was placed, but after some time they did not show so we return to the room to place an IV by ultrasound and found them in the room.  A 20-gauge was placed in her right AC by the IV nurse.  On reevaluation, the patient reports she is feeling much better after the Zofran and fluids and her chest pain is lessening.  She overall looks more comfortable.  CTA of chest shows no definite evidence of pulmonary embolus. No definite abnormality seen in the chest.  My attending called and spoke to the radiologist for the image who reports no evidence of dissection from his review as well.   At this time, the second troponin resulted at 9668.  Cardiology was consulted.  My attending spoke with Dr. Terri Skains, [facility] about the patient's elevated second troponins. He requested a UDS, TSH, and to make the patient NPO and they will come evaluate. Will keep  trending troponins. Heparin ordered per pharmacy.   Tertiary troponin at 22,926.  On another reevaluation, cardiology was  in the room and requested the rest of the potassium to be given now that she has a new IV in the [facility].  10 mill equivalents per hour x2 hours for potassium was ordered.  He reported to me that they will be taking her to the Cath Lab today and excepting her onto their service.  I discussed this case with my attending physician who cosigned this note including patient's presenting symptoms, physical exam, and planned diagnostics and interventions. Attending physician stated agreement with plan or made changes to plan which were implemented.   Attending physician assessed patient at bedside.  Final Clinical Impression(s) / ED Diagnoses Final diagnoses:  Chest pain, unspecified type  Troponin level elevated    Rx / DC Orders ED Discharge Orders     None         Sherrell Puller, PA-C 04/22/21 1958    Tegeler, Gwenyth Allegra, MD 04/23/21 [phone]

## 2021-04-22 NOTE — Progress Notes (Signed)
°  Echocardiogram 2D Echocardiogram has been performed.  [name] 04/22/2021, 12:19 PM

## 2021-04-22 NOTE — Progress Notes (Signed)
6E called down to the cath lab reporting patient had a hematoma from palm of hand to elbow. I came to assess patient and also called Dr patwardin on the way up. When I arrived to unit I called Dr Dario Ave to inform him that the arm was very tight and swollen. I was then ordered to start our Blood Pressure protocol. When I put the Blood Pressure cuff on after about 5-8 minutes she complained of how bad her arm hurt and she lost pleth wave form. Her hand also was starting to turn blue. I went ahead and took the blood pressure cuff off completely and called Dr Dario Ave. At that time he came in and also ask for the surgeon on call. Dr Trula Slade was then called and came in as well. Dr Trula Slade assessed the arm. We then started the Blood Pressure protocol again but at 35 mm Hg. We did that one time and put ice on the arm. Dr Trula Slade and Dr patwardin are still at the bedside at this time. Report and care given to Dorise Bullion.

## 2021-04-22 NOTE — ED Notes (Signed)
EDP at bedside  

## 2021-04-22 NOTE — ED Notes (Signed)
Pt called out stating she vomited and c/o 4/10 L sided CP. Terri Skains MD paged. Pt given 1 SL ntg. EKG obtained.

## 2021-04-22 NOTE — Progress Notes (Signed)
Right forearm hematoma grade 3 with numbness and paresthesias in fingers. Radial and ulnar pulse 2+, palmar arch intact. Alternate cuff inflation and ice with improvement in swelling and right handgrip. Continue monitoring overnight. Appreciate nursing staff and Dr. Vernona Rieger, MD Pager: [phone] Office: [phone]

## 2021-04-22 NOTE — ED Notes (Signed)
IV team at bedside 

## 2021-04-22 NOTE — ED Notes (Signed)
Pt not able to tolerate PO atorvastatin. Able to tolerate chewable ASA.

## 2021-04-22 NOTE — Consult Note (Signed)
[facility] [place]  Patient name: [name] MRN: [id number] DOB: 1980-10-01 Sex: female   REQUESTING PROVIDER:    Dr. Virgina Jock   REASON FOR CONSULT:    Right arm hematoma following cath  HISTORY OF PRESENT ILLNESS:   [name] is a 41 y.o. female, who presented to the emergency department earlier today with chest pain.  She was found to have a NSTEMI.  She was taken to the Cath Lab and underwent PCI via a right radial approach.  Her procedure went to 6 E.  She then walking hematoma this became very tight and she started having burning and numbness she was started on a blood pressure cuff protocol because of pain she could not tolerate this.  Her hand started becoming discolored.  I was asked with the patient.  She was loaded with Brilinta after her procedure.  Her heparin is now off.  Her TR band remains in place  PAST MEDICAL HISTORY    Past Medical History:  Diagnosis Date   Acid reflux    Alcohol abuse    Anemia    Depression    GERD (gastroesophageal reflux disease)    H/O suicide attempt    Hypertension    Vaginal Pap smear, abnormal      FAMILY HISTORY   Family History  Problem Relation Age of Onset   Heart disease Mother    Hyperlipidemia Mother    Hypertension Mother    Stroke Mother    Asthma Sister    Asthma Son    Heart disease Father    Heart attack Maternal Grandmother     SOCIAL HISTORY:   Social History   Socioeconomic History   Marital status: Single    Spouse name: Not on file   Number of children: Not on file   Years of education: Not on file   Highest education level: Not on file  Occupational History   Not on file  Tobacco Use   Smoking status: Former    Packs/day: 0.25    Years: 1.00    Pack years: 0.25    Types: Cigarettes    Quit date: 12/07/1999    Years since quitting: 21.3   Smokeless tobacco: Never   Tobacco comments:    smoked 3-4 years  Vaping  Use   Vaping Use: Never used  Substance and Sexual Activity   Alcohol use: Yes    Comment: Hx alcohol abuse; occassionally   Drug use: No    Types: Marijuana    Comment: occasionally   Sexual activity: Not Currently    Birth control/protection: None  Other Topics Concern   Not on file  Social History Narrative   Not on file   Social Determinants of Health   Financial Resource Strain: Not on file  Food Insecurity: Not on file  Transportation Needs: Not on file  Physical Activity: Not on file  Stress: Not on file  Social Connections: Not on file  Intimate Partner Violence: Not on file    ALLERGIES:    No Known Allergies  CURRENT MEDICATIONS:    Current Facility-Administered Medications  Medication Dose Route Frequency Provider Last Rate Last Admin   0.9 %  sodium chloride infusion  250 mL Intravenous PRN Patwardhan, Manish J, MD       0.9 %  sodium chloride infusion   Intravenous Continuous Patwardhan, Manish J, MD       0.9 %  sodium chloride infusion  250 mL  Intravenous PRN Patwardhan, Anabel Bene, MD       acetaminophen (TYLENOL) tablet 650 mg  650 mg Oral Q4H PRN Patwardhan, Anabel Bene, MD       [START ON 04/23/2021] aspirin EC tablet 81 mg  81 mg Oral Daily Tolia, Sunit, DO       atorvastatin (LIPITOR) tablet 80 mg  80 mg Oral Daily Tolia, Sunit, DO   80 mg at 04/22/21 1531   hydrALAZINE (APRESOLINE) injection 10 mg  10 mg Intravenous Q20 Min PRN Patwardhan, Manish J, MD       labetalol (NORMODYNE) injection 10 mg  10 mg Intravenous Q10 min PRN Patwardhan, Anabel Bene, MD       [START ON 04/23/2021] lisinopril (ZESTRIL) tablet 2.5 mg  2.5 mg Oral Daily Patwardhan, Manish J, MD       metoprolol tartrate (LOPRESSOR) tablet 25 mg  25 mg Oral BID Patwardhan, Manish J, MD       nitroGLYCERIN (NITROSTAT) SL tablet 0.4 mg  0.4 mg Sublingual Q5 Min x 3 PRN Tolia, Sunit, DO   0.4 mg at 04/22/21 1514   ondansetron (ZOFRAN) injection 4 mg  4 mg Intravenous Q6H PRN Patwardhan, Manish J,  MD       prochlorperazine (COMPAZINE) tablet 5 mg  5 mg Oral Once Tolia, Sunit, DO       sodium chloride flush (NS) 0.9 % injection 3 mL  3 mL Intravenous Q12H Patwardhan, Manish J, MD       sodium chloride flush (NS) 0.9 % injection 3 mL  3 mL Intravenous PRN Patwardhan, Manish J, MD       sodium chloride flush (NS) 0.9 % injection 3 mL  3 mL Intravenous Q12H Patwardhan, Manish J, MD       sodium chloride flush (NS) 0.9 % injection 3 mL  3 mL Intravenous PRN Patwardhan, Anabel Bene, MD       [START ON 04/23/2021] ticagrelor (BRILINTA) tablet 90 mg  90 mg Oral BID Patwardhan, Manish J, MD        REVIEW OF SYSTEMS:   [X]  denotes positive finding, [ ]  denotes negative finding Cardiac  Comments:  Chest pain or chest pressure:    Shortness of breath upon exertion:    Short of breath when lying flat:    Irregular heart rhythm:        Vascular    Pain in calf, thigh, or hip brought on by ambulation:    Pain in feet at night that wakes you up from your sleep:     Blood clot in your veins:    Leg swelling:         Pulmonary    Oxygen at home:    Productive cough:     Wheezing:         Neurologic    Sudden weakness in arms or legs:     Sudden numbness in arms or legs:     Sudden onset of difficulty speaking or slurred speech:    Temporary loss of vision in one eye:     Problems with dizziness:         Gastrointestinal    Blood in stool:      Vomited blood:         Genitourinary    Burning when urinating:     Blood in urine:        Psychiatric    Major depression:         Hematologic  Bleeding problems:    Problems with blood clotting too easily:        Skin    Rashes or ulcers:        Constitutional    Fever or chills:     PHYSICAL EXAM:   Vitals:   04/22/21 1747 04/22/21 1752 04/22/21 1840 04/22/21 2021  BP: 122/85 113/86 (!) 112/101 (P) 121/78  Pulse: 86 68 83 (P) 94  Resp: 17 16 16  (P) 18  Temp:   98.3 F (36.8 C)   TempSrc:   Oral   SpO2: 100% 100% 100%    Weight:      Height:        GENERAL: The patient is a well-nourished female, in no acute distress. The vital signs are documented above. CARDIAC: There is a regular rate and rhythm.  VASCULAR: The patient has a Doppler signal in the radial ulnar arteries as well as her palmar arch.  Right arm edema PULMONARY: Nonlabored respirations ABDOMEN: Soft and non-tender with normal pitched bowel sounds.  MUSCULOSKELETAL: The forearm is tight and tender to palpation. NEUROLOGIC: Decreased motor and sensory function to the right hand SKIN: There are no ulcers or rashes noted. PSYCHIATRIC: The patient has a normal affect.    ASSESSMENT and PLAN   Right forearm hematoma following cardiac catheterization via a right radial approach.  Upon my initial evaluation I was very concerned about a compartment syndrome because of how tight her forearm was coupled with the fact that she was having numbness and decreased motor function in her hand.  I contemplated taking her to the operating room for compartment release, however I wanted to attempt conservative measures first.  I went down and got a thigh blood pressure cuff and placed this on her forearm and inflated the cuff just below the point where he lost a palmar Doppler signal.  This was left in place for 15 minutes.  When the cuff was removed, the patient's forearm was packed with ice packs for an additional 15 minutes.  This treatment, the patient's hand began feeling better.  Her grip strength had improved as have the color in her hand.  In addition her compartment was much softer.  Therefore at this time I have recommended that we continue cycling a blood pressure cuff on her forearm with ice packs for 2 more rounds.  At this point it looks like she would not need to have decompressive fasciotomies.  She will need to be closely monitored throughout the night.  I spent in excess of 115 minutes taking care of this patient, nearly all of which were face-to-face  contact.  Her nursing staff knows to contact me with any neurologic changes tonight.   Leia Alf, MD, FACS [facility] [phone] Pager [phone]

## 2021-04-22 NOTE — ED Notes (Signed)
Lab notified of add-on labs and urine.

## 2021-04-22 NOTE — Progress Notes (Signed)
Patient's RN consulted for another PIV access for Magnesium and potassium drip. These both medications are compatible with heparin. Talked to ED pharmacist Apolonio Schneiders regarding this matter. She agreed to infuse together is okay. Informed patient's RN this and not put in the PIV access at this time. HS [employer]

## 2021-04-22 NOTE — ED Notes (Signed)
Patient transported to CT 

## 2021-04-22 NOTE — ED Notes (Signed)
Lab notified of add-on orders.

## 2021-04-22 NOTE — ED Triage Notes (Signed)
BIB [facility] with c/o sudden onset L sided chest pain, pale, diaphoretic. Pain described as sharp, radiating down arm. Episode of emesis with EMS. Hx of a fib.

## 2021-04-22 NOTE — ED Notes (Signed)
Lab called regarding additional labs.  All lab work is being ran except 1307 Trop.

## 2021-04-22 NOTE — Interval H&P Note (Signed)
History and Physical Interval Note:  04/22/2021 3:31 PM  [name]  has presented today for surgery, with the diagnosis of nstemi.  The various methods of treatment have been discussed with the patient and family. After consideration of risks, benefits and other options for treatment, the patient has consented to  Procedure(s): LEFT HEART CATH AND CORONARY ANGIOGRAPHY (N/A) as a surgical intervention.  The patient's history has been reviewed, patient examined, no change in status, stable for surgery.  I have reviewed the patient's chart and labs.  Questions were answered to the patient's satisfaction.    2016 Appropriate Use Criteria for Coronary Revascularization in Patients With Acute Coronary Syndrome NSTEMI/Unstable angina, stabilized patient at high risk Indication:  Revascularization by PCI or CABG of 1 or more arteries in a patient with NSTEMI or unstable angina with Stabilization after presentation High risk for clinical events A (7) Indication: 16; Score 7   [name] J Keidra Withers

## 2021-04-22 NOTE — Progress Notes (Signed)
ANTICOAGULATION CONSULT NOTE - Initial Consult  Pharmacy Consult for heparin Indication: chest pain/ACS  No Known Allergies  Patient Measurements: Height: 5\' 3"  (160 cm) Weight: 107 kg (236 lb) IBW/kg (Calculated) : 52.4 Heparin Dosing Weight: 78kg  Vital Signs: Temp: 97.3 F (36.3 C) (02/15 0622) BP: 133/115 (02/15 1140) Pulse Rate: 85 (02/15 1140)  Labs: Recent Labs    04/22/21 0631 04/22/21 0940  HGB 12.7  --   HCT 37.2  --   PLT 335  --   LABPROT 12.9  --   INR 1.0  --   CREATININE 1.26*  --   TROPONINIHS 29* 9,668*    Estimated Creatinine Clearance: 70.2 mL/min (A) (by C-G formula based on SCr of 1.26 mg/dL (H)).   Medical History: Past Medical History:  Diagnosis Date   Acid reflux    Alcohol abuse    Anemia    Depression    GERD (gastroesophageal reflux disease)    H/O suicide attempt    Hypertension    Vaginal Pap smear, abnormal     Medications:  Infusions:   sodium chloride     Followed by   sodium chloride     heparin      Assessment: 64 yof presented to the ED with CP. Troponin significantly elevated and now starting IV heparin. Baseline CBC is WNL. She is not on anticoagulation PTA.   Goal of Therapy:  Heparin level 0.3-0.7 units/ml Monitor platelets by anticoagulation protocol: Yes   Plan:  Heparin bolus 4000 units IV x 1 Heparin gtt 1000 units/hr Check a 6 hr HL vs f/u cath Daily HL and CBC  Kyliegh Jester, Rande Lawman 04/22/2021,11:52 AM

## 2021-04-22 NOTE — ED Notes (Signed)
ECHO at bedside.

## 2021-04-22 NOTE — H&P (Signed)
HISTORY AND PHYSICAL  Patient ID: AAMIRAH SNARR MRN: [id number] DOB/AGE: 03-13-1980 41 y.o.  Admit date: 04/22/2021 Attending physician: Monica Martinez Primary Physician: Dr. Vista Lawman  Chief complaint: chest pain   HPI:  [name] is a 41 y.o. female who presents with a chief complaint of " chest pain." Her past medical history and cardiovascular risk factors include: hypertension, family hx of CAD, former smoker, Marijuana use.  Patient woke up this morning approximately at 4 AM due to chest discomfort, located in the left anterior precordium, squeezing-like sensation followed by tightness, intensity 10 out of 10, associated with nausea/vomiting/diaphoresis/difficulty in breathing.  Patient called EMS was given aspirin in route and brought to the ED for further evaluation and management.  EKG in the ED does not meet STEMI criteria but rules in for NSTEMI. Currently chest pain free.   Last Marijuana use 04/19/2021 and last drink 04/17/2021.   ALLERGIES: No Known Allergies  PAST MEDICAL HISTORY: Past Medical History:  Diagnosis Date   Acid reflux    Alcohol abuse    Anemia    Depression    GERD (gastroesophageal reflux disease)    H/O suicide attempt    Hypertension    Vaginal Pap smear, abnormal     PAST SURGICAL HISTORY: Past Surgical History:  Procedure Laterality Date   LEEP      FAMILY HISTORY: The patient family history includes Asthma in her sister and son; Heart attack in her maternal grandmother; Heart disease in her father and mother; Hyperlipidemia in her mother; Hypertension in her mother; Stroke in her mother.   SOCIAL HISTORY:  The patient  reports that she quit smoking about 21 years ago. Her smoking use included cigarettes. She has a 0.25 pack-year smoking history. She has never used smokeless tobacco. She reports current alcohol use. She reports that she does not use drugs.  MEDICATIONS: Current Outpatient Medications  Medication  Instructions   diclofenac Sodium (VOLTAREN) 2 g, Topical, Daily PRN   diltiazem (CARDIZEM CD) 240 MG 24 hr capsule TAKE 1 CAPSULE(240 MG) BY MOUTH DAILY   folic acid (FOLVITE) 1 mg, Oral, Daily   hydrochlorothiazide (HYDRODIURIL) 25 mg, Oral, Daily   linaclotide (LINZESS) 72 mcg, Oral, Daily before breakfast   metoCLOPramide (REGLAN) 10 mg, Oral, Every 8 hours PRN   Multiple Vitamin (MULTIVITAMIN) capsule 1 capsule, Oral, Daily   naproxen (NAPROSYN) 500 mg, Oral, 2 times daily PRN   nortriptyline (PAMELOR) 50 mg, Oral, Daily at bedtime   omeprazole (PRILOSEC) 40 mg, Oral, Daily   sertraline (ZOLOFT) 100 mg, Oral, Daily   tiZANidine (ZANAFLEX) 4 mg, Oral, Every 8 hours PRN     sodium chloride     heparin 1,000 Units/hr (04/22/21 1225)   potassium chloride      REVIEW OF SYSTEMS: Review of Systems  Constitutional: Positive for diaphoresis. Negative for chills and fever.  HENT:  Negative for hoarse voice and nosebleeds.   Eyes:  Negative for discharge, double vision and pain.  Cardiovascular:  Positive for chest pain. Negative for claudication, dyspnea on exertion, leg swelling, near-syncope, orthopnea, palpitations, paroxysmal nocturnal dyspnea and syncope.  Respiratory:  Positive for shortness of breath. Negative for hemoptysis.   Musculoskeletal:  Negative for muscle cramps and myalgias.  Gastrointestinal:  Positive for nausea and vomiting. Negative for abdominal pain, constipation, diarrhea, hematemesis, hematochezia and melena.  Neurological:  Negative for dizziness and light-headedness.  All other systems reviewed and are negative.  PHYSICAL EXAM: Vitals with BMI 04/22/2021 04/22/2021 04/22/2021  Height - - -  Weight - - -  BMI - - -  Systolic [id number]  Diastolic 76 [id number]  Pulse 90 85 82     Intake/Output Summary (Last 24 hours) at 04/22/2021 1242 Last data filed at 04/22/2021 [id number] Gross per 24 hour  Intake 100 ml  Output --  Net 100 ml    Net IO Since Admission:  100 mL [04/22/21 1242] CONSTITUTIONAL: Well-developed and well-nourished. No acute distress.  SKIN: Skin is warm and dry. No rash noted. No cyanosis. No pallor. No jaundice HEAD: Normocephalic and atraumatic.  EYES: No scleral icterus MOUTH/THROAT: Moist oral membranes.  NECK: No JVD present. No thyromegaly noted. No carotid bruits  LYMPHATIC: No visible cervical adenopathy.  CHEST Normal respiratory effort. No intercostal retractions  LUNGS: Clear to auscultation bilaterally. No stridor. No wheezes. No rales.  CARDIOVASCULAR: Regular rate and rhythm, positive S1-S2, no murmurs rubs or gallops appreciated. ABDOMINAL: Obese, soft, nontender, nondistended, positive bowel sounds all 4 quadrants.No apparent ascites.  EXTREMITIES: No peripheral edema, warm to touch, +2 DP and PT.  HEMATOLOGIC: No significant bruising NEUROLOGIC: Oriented to person, place, and time. Nonfocal. Normal muscle tone.  PSYCHIATRIC: Normal mood and affect. Normal behavior. Cooperative  RADIOLOGY: DG Chest 2 View  Result Date: 04/22/2021 CLINICAL DATA:  41 year old female with chest pain, shortness of breath, nausea vomiting. EXAM: CHEST - 2 VIEW COMPARISON:  Portable chest 11/03/2019 and earlier. FINDINGS: Semi upright AP and lateral views of the chest at 0645 hours. Lower lung volumes compared to 2021. Mediastinal contours remain normal. Visualized tracheal air column is within normal limits. Both lungs remain clear. No pneumothorax or pleural effusion. No acute osseous abnormality identified. Negative visible bowel gas pattern. IMPRESSION: Lower lung volumes.  No cardiopulmonary abnormality. Electronically Signed   By: Genevie Ann M.D.   On: 04/22/2021 07:23   CT Angio Chest PE W and/or Wo Contrast  Result Date: 04/22/2021 CLINICAL DATA:  Positive D-dimer, chest pain. EXAM: CT ANGIOGRAPHY CHEST WITH CONTRAST TECHNIQUE: Multidetector CT imaging of the chest was performed using the standard protocol during bolus  administration of intravenous contrast. Multiplanar CT image reconstructions and MIPs were obtained to evaluate the vascular anatomy. RADIATION DOSE REDUCTION: This exam was performed according to the departmental dose-optimization program which includes automated exposure control, adjustment of the mA and/or kV according to patient size and/or use of iterative reconstruction technique. CONTRAST:  44mL OMNIPAQUE IOHEXOL 350 MG/ML SOLN COMPARISON:  None. FINDINGS: Cardiovascular: Satisfactory opacification of the pulmonary arteries to the segmental level. No evidence of pulmonary embolism. Normal heart size. No pericardial effusion. Mediastinum/Nodes: No enlarged mediastinal, hilar, or axillary lymph nodes. Thyroid gland, trachea, and esophagus demonstrate no significant findings. Lungs/Pleura: Lungs are clear. No pleural effusion or pneumothorax. Upper Abdomen: No acute abnormality. Musculoskeletal: No chest wall abnormality. No acute or significant osseous findings. Review of the MIP images confirms the above findings. IMPRESSION: No definite evidence of pulmonary embolus. No definite abnormality seen in the chest. Electronically Signed   By: Marijo Conception M.D.   On: 04/22/2021 10:40    LABORATORY DATA: Lab Results  Component Value Date   WBC 8.2 04/22/2021   HGB 12.7 04/22/2021   HCT 37.2 04/22/2021   MCV 88.8 04/22/2021   PLT 335 04/22/2021    Recent Labs  Lab 04/22/21 0631  NA 137  K 2.6*  CL 100  CO2 23  BUN 18  CREATININE 1.26*  CALCIUM 9.5  PROT 7.2  BILITOT 0.1*  ALKPHOS 69  ALT 23  AST 25  GLUCOSE 163*    Lipid Panel  No results found for: CHOL, HDL, LDLCALC, LDLDIRECT, TRIG, CHOLHDL  BNP (last 3 results) No results for input(s): BNP in the last 8760 hours.  HEMOGLOBIN A1C No results found for: HGBA1C, MPG  Cardiac Panel (last 3 results) No results for input(s): CKTOTAL, CKMB, RELINDX in the last 8760 hours.  Invalid input(s): TROPONINHS  Lab Results  Component  Value Date   [id number] (H) 02/29/2008   CKMB QUANTITY NOT SUFFICIENT, UNABLE TO PERFORM TEST 02/29/2008     TSH Recent Labs    04/22/21 1125  TSH 0.506     Cardiac Panel (last 3 results) Recent Labs    04/22/21 0631 04/22/21 0940 04/22/21 1125  TROPONINIHS 29* 9,668* 22,926*   CARDIAC DATABASE: EKG: 04/22/2021: Normal sinus rhythm, 84 bpm, PVCs, ST-T changes in inferolateral leads suggestive of possible ischemia.  04/22/2021 10:58 AM normal sinus rhythm, 85 bpm, PVCs, without underlying ischemia.  Echocardiogram: 12/11/2019: 1. Normal LV systolic function with EF 54%. Left ventricle cavity is normal in size. Normal global wall motion. Normal diastolic filling pattern. Calculated EF 54%. 2. Normal study.  Stress Testing:  Exercise treadmill stress test 12/17/2019: Exercise treadmill stress test performed using Bruce protocol.  Patient reached 5.9 METS, and 86% of age predicted maximum heart rate.  Exercise capacity was low.  No chest pain reported. Dyspnea and dizziness reported.  Normal heart rate and hemodynamic response.  Stress EKG demonstrated sinus tachycardia, no ST-T wave abnormalities, occasional PVC's. Low risk study. Recommend clinical correlation due to poor exercise capacity.   IMPRESSION: [name] is a 41 y.o. African-American female whose past medical history and cardiovascular risk factors include: hypertension, family hx of CAD, former smoker, Marijuana use.  NSTEMI Precordial Pain PVCs Hypokalemia  HTN  Family history of CAD Marijuana use   RECOMMENDATIONS: NSTEMI The patient presents with CP since morning. EKG shows NSR w/ ST depression inferolateral leads, and cardiac enzymes are elevated. Overall, this is consistent with ACS.  Echocardiogram will be ordered to evaluate for structural heart disease and left ventricular systolic function..  Check lipid profile (in the morning), A1c, BNP, and TSH.  Continue to trend cardiac enzymes. CT  PE protocol negative for pulmonary embolism. Check urine drug screen and urine pregnancy. Give 325 mg ASA (if not already given) and continue 81 mg daily. Give 80 mg atorvastatin and continue daily.  Would start heparin drip for ACS protocol.  The patient is currently without chest pain and would give sublingual nitroglycerin if chest pain returns. Remain on telemetry and obtain a stat EKG should chest pain return or change in character/quality.  Keep NPO with plans for heart catheterization with possible intervention later today. Should symptoms of chest pain continue despite 3 sublingual nitroglycerin tablets, the patient becomes hemodynamically unstable (hypotension, congestive heart failure), or develop electrical instability (VT/VF), please notify cardiology on call for urgent evaluation.   Hypokalemia:  Possibly due to intractable nausea and vomiting. Potassium chloride 10 mEq x 1, 2 more bags pending. K-Dur 40 mEq x 1  Premature ventricular contractions: Currently supplementing potassium as discussed above. Check magnesium level. Oral prophylactically give magnesium sulfate 2 g IV piggyback.  Benign essential hypertension: Currently well controlled. Monitor for now.  Consultants: None    Code Status: Full Code.    Family Communication:  None. If needed call Karie Bridgman (mom) [phone] or ex-husband Azucena Kuba [phone]   Disposition Plan: Home  CRITICAL CARE Performed by: Rex Kras   Total critical care time: 35 minutes   Critical care time was exclusive of separately billable procedures and treating other patients.   Critical care was necessary to treat or prevent imminent or life-threatening deterioration.   Critical care was time spent personally by me on the following activities: development of treatment plan with patient and/or surrogate as well as nursing, discussions with ER physician and interventional cardiology, evaluation of patient's response to  treatment, examination of patient, ordering and performing treatments and interventions, ordering and review of laboratory studies, ordering and review of radiographic studies, pulse oximetry and re-evaluation of patient's condition.  This note was created using a voice recognition software as a result there may be grammatical errors inadvertently enclosed that do not reflect the nature of this encounter. Every attempt is made to correct such errors.  Mechele Claude [facility]  Pager: [phone] Office: [phone] 04/22/2021, 12:42 PM

## 2021-04-23 ENCOUNTER — Other Ambulatory Visit (HOSPITAL_COMMUNITY): Payer: Self-pay

## 2021-04-23 ENCOUNTER — Encounter (HOSPITAL_COMMUNITY): Payer: Self-pay | Admitting: Cardiology

## 2021-04-23 DIAGNOSIS — I97630 Postprocedural hematoma of a circulatory system organ or structure following a cardiac catheterization: Secondary | ICD-10-CM

## 2021-04-23 LAB — BASIC METABOLIC PANEL
Anion gap: 10 (ref 5–15)
BUN: 10 mg/dL (ref 6–20)
CO2: 25 mmol/L (ref 22–32)
Calcium: 9.2 mg/dL (ref 8.9–10.3)
Chloride: 106 mmol/L (ref 98–111)
Creatinine, Ser: 0.87 mg/dL (ref 0.44–1.00)
GFR, Estimated: >60 mL/min (ref >60)
Glucose, Bld: 152 mg/dL — ABNORMAL HIGH (ref 70–99)
Potassium: 3.1 mmol/L — ABNORMAL LOW (ref 3.5–5.1)
Sodium: 141 mmol/L (ref 135–145)

## 2021-04-23 LAB — LIPID PANEL
Cholesterol: 239 mg/dL — ABNORMAL HIGH (ref 0–200)
HDL: 46 mg/dL (ref >40)
LDL Cholesterol: 177 mg/dL — ABNORMAL HIGH (ref 0–99)
Total CHOL/HDL Ratio: 5.2 RATIO
Triglycerides: 79 mg/dL (ref <150)
VLDL: 16 mg/dL (ref 0–40)

## 2021-04-23 LAB — LDL CHOLESTEROL, DIRECT: Direct LDL: 172.9 mg/dL — ABNORMAL HIGH (ref 0–99)

## 2021-04-23 LAB — MAGNESIUM: Magnesium: 2.2 mg/dL (ref 1.7–2.4)

## 2021-04-23 MED ORDER — ALUM & MAG HYDROXIDE-SIMETH 200-200-20 MG/5ML PO SUSP
30.0000 mL | Freq: Once | ORAL | Status: AC
Start: 2021-04-23 — End: 2021-04-23
  Administered 2021-04-23: 30 mL via ORAL
  Filled 2021-04-23: qty 30

## 2021-04-23 MED ORDER — PANTOPRAZOLE SODIUM 40 MG PO TBEC
40.0000 mg | DELAYED_RELEASE_TABLET | Freq: Every day | ORAL | Status: DC
  Administered 2021-04-23 – 2021-04-24 (×2): 40 mg via ORAL
  Filled 2021-04-23 (×2): qty 1

## 2021-04-23 MED ORDER — POTASSIUM CHLORIDE CRYS ER 20 MEQ PO TBCR: 40.0000 meq | EXTENDED_RELEASE_TABLET | Freq: Once | ORAL | Status: AC

## 2021-04-23 MED ORDER — PANTOPRAZOLE SODIUM 40 MG PO TBEC
40.0000 mg | DELAYED_RELEASE_TABLET | Freq: Once | ORAL | Status: AC
  Administered 2021-04-23: 40 mg via ORAL
  Filled 2021-04-23: qty 1

## 2021-04-23 MED ORDER — PROMETHAZINE HCL 25 MG/ML IJ SOLN
12.5000 mg | Freq: Once | INTRAMUSCULAR | Status: AC
  Administered 2021-04-23: 12.5 mg via INTRAVENOUS
  Filled 2021-04-23: qty 0.5

## 2021-04-23 MED ORDER — ONDANSETRON HCL 4 MG/2ML IJ SOLN
4.0000 mg | Freq: Once | INTRAMUSCULAR | Status: AC
  Administered 2021-04-23: 4 mg via INTRAVENOUS

## 2021-04-23 MED ORDER — POTASSIUM CHLORIDE CRYS ER 20 MEQ PO TBCR
40.0000 meq | EXTENDED_RELEASE_TABLET | ORAL | Status: AC
  Administered 2021-04-23 (×2): 40 meq via ORAL
  Filled 2021-04-23 (×2): qty 2

## 2021-04-23 MED ORDER — HYOSCYAMINE SULFATE 0.125 MG SL SUBL
0.2500 mg | SUBLINGUAL_TABLET | Freq: Once | SUBLINGUAL | Status: AC
  Administered 2021-04-23: 0.25 mg via SUBLINGUAL
  Filled 2021-04-23: qty 2

## 2021-04-23 MED FILL — Midazolam HCl Inj 2 MG/2ML (Base Equivalent): INTRAMUSCULAR | Qty: 2 | Status: AC

## 2021-04-23 NOTE — Progress Notes (Addendum)
[facility]  Subjective  - Right UE feels better   Objective (!) 154/110 91 98 F (36.7 C) (Oral) 18 100%  Intake/Output Summary (Last 24 hours) at 04/23/2021 [id number] Last data filed at 04/23/2021 0535 Gross per 24 hour  Intake 412.5 ml  Output --  Net 412.5 ml    Right radial pulse weakly palpable, skin without ischemic changes, hand warm to touch  Right UE grip 4+/5, sensation intact, comparments compressible Lungs non labored breathing  Assessment/Planning: Right radial artery hematoma post cardiac cath Post bedside treatment with Dr. Trula Slade improved motor, sensation and weakly palpable radial pulse.  Compartments are compressible and pain is well controlled will continue to observe.   Roxy Horseman 04/23/2021 7:28 AM --  Laboratory Lab Results: Recent Labs    04/22/21 0631 04/22/21 1753  WBC 8.2  --   HGB 12.7 11.2*  HCT 37.2 33.0*  PLT 335  --    BMET Recent Labs    04/22/21 0631 04/22/21 1125 04/22/21 1753  NA 137  --  139  K 2.6* 3.3* 3.4*  CL 100  --  106  CO2 23  --   --   GLUCOSE 163*  --  102*  BUN 18  --  13  CREATININE 1.26* 0.98 0.70  CALCIUM 9.5  --   --     COAG Lab Results  Component Value Date   INR 1.0 04/22/2021   No results found for: PTT   I agree with the above.  Have seen and evaluated the patient.  Her arm is much better this morning.  Her compartments are now soft.  She has sensation return in her right hand.  Her grip strength is dramatically better.  At this point, no surgical intervention is indicated.  We will continue to monitor her arm throughout the day.  Annamarie Major

## 2021-04-23 NOTE — Progress Notes (Signed)
Pt sts she has had nausea and vomiting with movement and eating today. She sat up for a few minutes while we talked, then had sudden nausea and laid back down. HR 115 ST sitting up, BP 156/111. Discussed with pt MI, stent, Brilinta importance, diet, and CRPII. Receptive. Will refer to [place]. Will f/u tomorrow for more education and hopefully ambulation.  [place], ACSM 2:42 PM 04/23/2021

## 2021-04-23 NOTE — Progress Notes (Signed)
Karena Addison RN asked me Psychologist, occupational) to assess pt's arm. That is appeared to be puffy. I immediately went to pt's room. Pt had swelling from hands to elbow with TR band on. I held pressure and noticed that pt would loose pleth even with gentle pressure. Pt stated that she was loosing feeling in her hand. I proceeded to call Dorothyann Gibbs RN in cath lab. She came to bedside to assess and called Dr. Rosemary Holms. See MD and Neely's notes for further information. Emelda Brothers RN

## 2021-04-23 NOTE — Discharge Instructions (Signed)

## 2021-04-23 NOTE — Progress Notes (Addendum)
Subjective:  Overnight nausea and vomiting On pain, numbness improving  Objective:  Vital Signs in the last 24 hours: Temp:  [98 F (36.7 C)-98.4 F (36.9 C)] 98.1 F (36.7 C) (02/16 1100) Pulse Rate:  [68-109] 86 (02/16 1100) Resp:  [10-25] 16 (02/16 1100) BP: (106-155)/(68-110) 111/96 (02/16 1100) SpO2:  [95 %-100 %] 95 % (02/16 1100) Weight:  [103.7 kg] 103.7 kg (02/16 0500)  Intake/Output from previous day: 02/15 0701 - 02/16 0700 In: 412.5 [I.V.:191.5; IV Piggyback:221] Out: -   Physical Exam Vitals and nursing note reviewed.  Constitutional:      General: She is not in acute distress. Neck:     Vascular: No JVD.  Cardiovascular:     Rate and Rhythm: Normal rate and regular rhythm.     Heart sounds: Normal heart sounds. No murmur heard. Pulmonary:     Effort: Pulmonary effort is normal.     Breath sounds: Normal breath sounds. No wheezing or rales.  Musculoskeletal:     Right lower leg: No edema.     Left lower leg: No edema.     Comments: Right forearm hematoma softer Handgrip motor function improved Palpable radial pulse 1+     Lab Results: Reviewed and interpreted: CBC, BMP Lipid panel  EKG 04/22/2021: Sinus tachycardia with occasional Premature ventricular complexes Otherwise normal ECG  Cardiac Studies:  EKG 04/22/2021: Sinus tachycardia with occasional Premature ventricular complexes Otherwise normal ECG  Coronary intervention 04/22/2021: LM: Normal LAD: Normal Lcx: Large, dominant vessel        Prox 80% eccentric stenosis        Lateral branch off OM1 with ostial 70% stenosis        OM3 ostial 70% stenosis RCA: Nondominant   LVEDP normal   Successful percutaneous coronary intervention prox LCx        PTCA and stent placement 3.0 X 18 mm Onyx Frontier drug-eluting stent        Post dilatation with 3.25X8 mm [place] balloon at 16 atm   Rest of the moderate residual disease best treated medically   Echocardiogram 04/22/2021:  1. Left  ventricular ejection fraction, by estimation, is 55 to 60%. The  left ventricle has normal function. The left ventricle demonstrates  regional wall motion abnormalities (see scoring diagram/findings for  description). Left ventricular diastolic  parameters were normal. There is mild hypokinesis of the left ventricular,  basal inferolateral wall.   2. Right ventricular systolic function is low normal. The right  ventricular size is not well visualized.   3. The mitral valve is normal in structure. No evidence of mitral valve  regurgitation. No evidence of mitral stenosis.   4. The aortic valve is normal in structure. Aortic valve regurgitation is  not visualized. No aortic stenosis is present.   Assessment & Recommendations:  41 y/o [place] female with hypertension, prediabetes, family h/o CAD, depression, NSTEMI  NSTEMI: Culprit vessel prox Lcx, successful PCI 80%-->0% stenosis Residual moderate disease in other OM branches DAPT with Aspirin, Brilinta till 04/2022 LDL 172, statin naive. Now on Lipitor 80 mg. Lipoprotein (a) pending Will repeat lipid panel in 1 month, Will likely need Repatha Changed diltiazem to metoprolol tartrate 25 mg bid Changed HCTZ to lisinopril 2.5 mg daily given CAD and prediabetes  Rt forearm hematoma: Complication from radial access PCI Improving. No acute indication for surgery Appreciate Dr Stephens Shire input Monitor for one more day  Nausea, vomiting: Present even prior to this admission. Suspect GERD or gastroparesis PO protonix  40 mg, prn zofran for now May need outpatient GI workup  Hypertension: Fairly well controlled barring few exceptions. Will monitor    Nigel Mormon, MD Pager: [phone] Office: [phone]

## 2021-04-24 ENCOUNTER — Other Ambulatory Visit (HOSPITAL_COMMUNITY): Payer: Self-pay

## 2021-04-24 LAB — BASIC METABOLIC PANEL
Anion gap: 11 (ref 5–15)
BUN: 13 mg/dL (ref 6–20)
CO2: 24 mmol/L (ref 22–32)
Calcium: 9.4 mg/dL (ref 8.9–10.3)
Chloride: 105 mmol/L (ref 98–111)
Creatinine, Ser: 0.91 mg/dL (ref 0.44–1.00)
GFR, Estimated: >60 mL/min (ref >60)
Glucose, Bld: 139 mg/dL — ABNORMAL HIGH (ref 70–99)
Potassium: 3.4 mmol/L — ABNORMAL LOW (ref 3.5–5.1)
Sodium: 140 mmol/L (ref 135–145)

## 2021-04-24 LAB — LIPOPROTEIN A (LPA): Lipoprotein (a): 296.4 nmol/L — ABNORMAL HIGH (ref <75.0)

## 2021-04-24 MED ORDER — POTASSIUM CHLORIDE 20 MEQ/15ML (10%) PO SOLN
ORAL | 0 refills | Status: DC
  Filled 2021-04-24: qty 450, 30d supply, fill #0

## 2021-04-24 MED ORDER — POTASSIUM CHLORIDE CRYS ER 20 MEQ PO TBCR
20.0000 meq | EXTENDED_RELEASE_TABLET | Freq: Once | ORAL | Status: AC
  Administered 2021-04-24: 20 meq via ORAL
  Filled 2021-04-24: qty 1

## 2021-04-24 MED ORDER — POTASSIUM CHLORIDE CRYS ER 20 MEQ PO TBCR: 20.0000 meq | EXTENDED_RELEASE_TABLET | Freq: Every day | ORAL | 0 refills | Status: DC

## 2021-04-24 MED ORDER — LISINOPRIL 2.5 MG PO TABS
2.5000 mg | ORAL_TABLET | Freq: Every day | ORAL | 1 refills | Status: DC
  Filled 2021-04-24: qty 30, 30d supply, fill #0

## 2021-04-24 MED ORDER — ASPIRIN 81 MG PO TBEC
81.0000 mg | DELAYED_RELEASE_TABLET | Freq: Every day | ORAL | 1 refills | Status: DC
  Filled 2021-04-24: qty 30, 30d supply, fill #0

## 2021-04-24 MED ORDER — METOPROLOL TARTRATE 25 MG PO TABS
25.0000 mg | ORAL_TABLET | Freq: Two times a day (BID) | ORAL | 1 refills | Status: DC
  Filled 2021-04-24: qty 60, 30d supply, fill #0

## 2021-04-24 MED ORDER — NITROGLYCERIN 0.4 MG SL SUBL
0.4000 mg | SUBLINGUAL_TABLET | SUBLINGUAL | 1 refills | Status: DC | PRN
  Filled 2021-04-24: qty 25, 7d supply, fill #0

## 2021-04-24 MED ORDER — SODIUM CHLORIDE 0.9 % IV SOLN
12.5000 mg | Freq: Four times a day (QID) | INTRAVENOUS | Status: DC | PRN
  Administered 2021-04-24 (×2): 12.5 mg via INTRAVENOUS
  Filled 2021-04-24 (×3): qty 0.5

## 2021-04-24 MED ORDER — POTASSIUM CHLORIDE 10 MEQ/100ML IV SOLN
10.0000 meq | INTRAVENOUS | Status: AC
  Administered 2021-04-24 (×2): 10 meq via INTRAVENOUS
  Filled 2021-04-24 (×2): qty 100

## 2021-04-24 MED ORDER — TICAGRELOR 90 MG PO TABS
90.0000 mg | ORAL_TABLET | Freq: Two times a day (BID) | ORAL | 1 refills | Status: DC
Start: 2021-04-24 — End: 2021-05-07
  Filled 2021-04-24: qty 60, 30d supply, fill #0

## 2021-04-24 MED ORDER — ATORVASTATIN CALCIUM 80 MG PO TABS
80.0000 mg | ORAL_TABLET | Freq: Every day | ORAL | 1 refills | Status: DC
  Filled 2021-04-24: qty 30, 30d supply, fill #0

## 2021-04-24 NOTE — Progress Notes (Signed)
[facility]  Subjective  - Right UE feels better, complaining of nausea   Objective (!) 136/105 90 98.5 F (36.9 C) (Oral) 18 95%  Intake/Output Summary (Last 24 hours) at 04/24/2021 1329 Last data filed at 04/24/2021 0400 Gross per 24 hour  Intake 660 ml  Output 300 ml  Net 360 ml     Right radial pulse weakly palpable, skin without ischemic changes, hand warm to touch  Right UE grip 4+/5, sensation intact, comparments compressible Lungs non labored breathing  Assessment/Planning: Right radial artery hematoma post cardiac cath Post bedside treatment with [name] improved motor, sensation and weakly palpable radial pulse.  Compartments are compressible. Hand without pain. Can d/c from vascular surgery perspective    [name] 04/24/2021 1:29 PM --  Laboratory Lab Results: Recent Labs    04/22/21 0631 04/22/21 1753  WBC 8.2  --   HGB 12.7 11.2*  HCT 37.2 33.0*  PLT 335  --     BMET Recent Labs    04/23/21 0638 04/24/21 1004  NA 141 140  K 3.1* 3.4*  CL 106 105  CO2 25 24  GLUCOSE 152* 139*  BUN 10 13  CREATININE 0.87 0.91  CALCIUM 9.2 9.4     COAG Lab Results  Component Value Date   INR 1.0 04/22/2021   No results found for: PTT

## 2021-04-24 NOTE — Discharge Summary (Signed)
Physician Discharge Summary  Patient ID: [name] MRN: [id number] DOB/AGE: Aug 18, 1980 41 y.o.  Admit date: 04/22/2021 Discharge date: 04/24/2021  Primary Discharge Diagnosis: Non-STEMI S/p PTCA and PCI to LCX Right forearm hematoma Precordial chest pain Pure hypercholesterolemia Elevated LP(a) Prediabetes  Secondary Discharge Diagnosis: Hypokalemia Hypertension. PVCs. Marijuana use  Hospital Course:   41 y.o. African-American female  with coronary artery disease status post PCI to LCx, hypertension, family hx of CAD, former smoker, Marijuana use.  Patient presented to the hospital after precordial discomfort very suggestive of cardiac etiology she ruled in for non-STEMI and was taken to the Cath Lab for invasive angiography with possible intervention. She was noted to have obstructive CAD in the LCx distribution and underwent angioplasty and stenting.  Patient did well initially; however, later that night started experiencing right arm discomfort and was diagnosed with right forearm hematoma.  This was addressed in conjunction with cardiology and vascular surgery.  She responded well to conservative management.  She no longer is having symptoms of tingling or numbness in the right arm/forearm/hand.  She has +1 right radial pulse.  No anginal or vascular discomfort overnight.  Currently asymptomatic with the exception of baseline nausea.  Patient wishes to go home.  Discharge Exam:  Vitals with BMI 04/24/2021 04/24/2021 04/24/2021  Height - - -  Weight - 225 lbs 8 oz -  BMI - 23.53 -  Systolic 614 - 431  Diastolic 540 - 086  Pulse 96 - 116    CONSTITUTIONAL: Well-developed and well-nourished. No acute distress.  SKIN: Skin is warm and dry. No rash noted. No cyanosis. No pallor. No jaundice HEAD: Normocephalic and atraumatic.  EYES: No scleral icterus MOUTH/THROAT: Moist oral membranes.  NECK: No JVD present. No thyromegaly noted. No carotid bruits  LYMPHATIC: No  visible cervical adenopathy.  CHEST Normal respiratory effort. No intercostal retractions  LUNGS: Clear to auscultation bilaterally.  No stridor. No wheezes. No rales.  CARDIOVASCULAR: Regular, positive S1-S2, no murmurs rubs or gallops appreciated. ABDOMINAL: Obese, soft, nontender, distended, positive bowel sounds in all 4 quadrants, no apparent ascites.  EXTREMITIES: No peripheral edema.  Right radial pulse +1, right forearm hematoma present but relatively soft, abduction and adduction of the fingers are intact, right hand strength 5+.  Normal examination of the left upper extremity. HEMATOLOGIC: No significant bruising NEUROLOGIC: Oriented to person, place, and time. Nonfocal. Normal muscle tone.  PSYCHIATRIC: Normal mood and affect. Normal behavior. Cooperative  Recommendations on discharge:   Non-STEMI: Culprit vessel proximal LCx, successful PTCA/PCI with DES. Moderate disease in the remaining obtuse marginal branches to be treated medically. Dual antiplatelet therapy with aspirin and Brilinta until February 2024. Direct LDL 172 and LP(a) 296 -statin nave.  Started on Lipitor 80 mg p.o. nightly. Will need follow-up fasting lipid profile in 6 weeks to reevaluate therapy.  If still not at goal would recommend PCSK9 inhibitors in conjunction to maximally tolerated statin therapy. Patient is currently being uptitrated GDMT: Toprol-XL, lisinopril, high intensity statin.  Right forearm hematoma: Improving. Responded very well to noninvasive measures by vascular surgery greatly appreciate input of Dr.Brabham.  We will await clearance for discharge and outpatient follow-up as per their recommendations.  Nausea, vomiting: Chronic and stable. Present prior to admission. Suspect differential diagnoses include but not limited to: GERD, gastroparesis, marijuana use, etc. Patient is asked to follow-up with PCP. Currently on antiemetic medications.  Benign essential hypertension: Within  acceptable range. Medication titrated, please see discharge paperwork  Patient can be discharged from  our standpoint once [facility] pharmacy has delivered outpatient medication, morning labs have been performed/reviewed, and final vascular recommendations have been provided.  Patient's RN has been updated with the plan of care for today with goals of potential discharge.    CARDIAC DATABASE: EKG 04/22/2021: Sinus tachycardia with occasional Premature ventricular complexes Otherwise normal ECG   Coronary intervention 04/22/2021: LM: Normal LAD: Normal Lcx: Large, dominant vessel        Prox 80% eccentric stenosis        Lateral branch off OM1 with ostial 70% stenosis        OM3 ostial 70% stenosis RCA: Nondominant   LVEDP normal   Successful percutaneous coronary intervention prox LCx        PTCA and stent placement 3.0 X 18 mm Onyx Frontier drug-eluting stent        Post dilatation with 3.25X8 mm [place] balloon at 16 atm   Rest of the moderate residual disease best treated medically   Echocardiogram 04/22/2021:  1. Left ventricular ejection fraction, by estimation, is 55 to 60%. The  left ventricle has normal function. The left ventricle demonstrates  regional wall motion abnormalities (see scoring diagram/findings for  description). Left ventricular diastolic  parameters were normal. There is mild hypokinesis of the left ventricular,  basal inferolateral wall.   2. Right ventricular systolic function is low normal. The right  ventricular size is not well visualized.   3. The mitral valve is normal in structure. No evidence of mitral valve  regurgitation. No evidence of mitral stenosis.   4. The aortic valve is normal in structure. Aortic valve regurgitation is  not visualized. No aortic stenosis is present.   Labs:   Lab Results  Component Value Date   WBC 8.2 04/22/2021   HGB 11.2 (L) 04/22/2021   HCT 33.0 (L) 04/22/2021   MCV 88.8 04/22/2021   PLT 335 04/22/2021    Recent Labs   Lab 04/22/21 0631 04/22/21 1125 04/23/21 0638  NA 137   < > 141  K 2.6*   < > 3.1*  CL 100   < > 106  CO2 23  --  25  BUN 18   < > 10  CREATININE 1.26*   < > 0.87  CALCIUM 9.5  --  9.2  PROT 7.2  --   --   BILITOT 0.1*  --   --   ALKPHOS 69  --   --   ALT 23  --   --   AST 25  --   --   GLUCOSE 163*   < > 152*   < > = values in this interval not displayed.    Lipid Panel     Component Value Date/Time   CHOL 239 (H) 04/23/2021 0638   TRIG 79 04/23/2021 0638   HDL 46 04/23/2021 0638   CHOLHDL 5.2 04/23/2021 0638   VLDL 16 04/23/2021 0638   LDLCALC 177 (H) 04/23/2021 0638    BNP (last 3 results) Recent Labs    04/22/21 0631  BNP 8.2    HEMOGLOBIN A1C Lab Results  Component Value Date   HGBA1C 6.0 (H) 04/22/2021   MPG 125.5 04/22/2021    Cardiac Panel (last 3 results) No results for input(s): CKTOTAL, CKMB, TROPONINI, RELINDX in the last 8760 hours.  Lab Results  Component Value Date   CKTOTAL 193 (H) 02/29/2008   CKMB QUANTITY NOT SUFFICIENT, UNABLE TO PERFORM TEST 02/29/2008   TROPONINI QUANTITY NOT SUFFICIENT, UNABLE TO PERFORM  TEST 02/29/2008     TSH Recent Labs    04/22/21 1125  TSH 0.506    Radiology: DG Chest 2 View  Result Date: 04/22/2021 CLINICAL DATA:  41 year old female with chest pain, shortness of breath, nausea vomiting. EXAM: CHEST - 2 VIEW COMPARISON:  Portable chest 11/03/2019 and earlier. FINDINGS: Semi upright AP and lateral views of the chest at 0645 hours. Lower lung volumes compared to 2021. Mediastinal contours remain normal. Visualized tracheal air column is within normal limits. Both lungs remain clear. No pneumothorax or pleural effusion. No acute osseous abnormality identified. Negative visible bowel gas pattern. IMPRESSION: Lower lung volumes.  No cardiopulmonary abnormality. Electronically Signed   By: Genevie Ann M.D.   On: 04/22/2021 07:23   CT Angio Chest PE W and/or Wo Contrast  Result Date: 04/22/2021 CLINICAL DATA:   Positive D-dimer, chest pain. EXAM: CT ANGIOGRAPHY CHEST WITH CONTRAST TECHNIQUE: Multidetector CT imaging of the chest was performed using the standard protocol during bolus administration of intravenous contrast. Multiplanar CT image reconstructions and MIPs were obtained to evaluate the vascular anatomy. RADIATION DOSE REDUCTION: This exam was performed according to the departmental dose-optimization program which includes automated exposure control, adjustment of the mA and/or kV according to patient size and/or use of iterative reconstruction technique. CONTRAST:  44m OMNIPAQUE IOHEXOL 350 MG/ML SOLN COMPARISON:  None. FINDINGS: Cardiovascular: Satisfactory opacification of the pulmonary arteries to the segmental level. No evidence of pulmonary embolism. Normal heart size. No pericardial effusion. Mediastinum/Nodes: No enlarged mediastinal, hilar, or axillary lymph nodes. Thyroid gland, trachea, and esophagus demonstrate no significant findings. Lungs/Pleura: Lungs are clear. No pleural effusion or pneumothorax. Upper Abdomen: No acute abnormality. Musculoskeletal: No chest wall abnormality. No acute or significant osseous findings. Review of the MIP images confirms the above findings. IMPRESSION: No definite evidence of pulmonary embolus. No definite abnormality seen in the chest. Electronically Signed   By: JMarijo ConceptionM.D.   On: 04/22/2021 10:40   CARDIAC CATHETERIZATION  Result Date: 04/22/2021 Images from the original result were not included. LM: Normal LAD: Normal Lcx: Large, dominant vessel        Prox 80% eccentric stenosis        Lateral branch off OM1 with ostial 70% stenosis        OM3 ostial 70% stenosis RCA: Nondominant LVEDP normal Successful percutaneous coronary intervention prox LCx        PTCA and stent placement 3.0 X 18 mm Onyx Frontier drug-eluting stent        Post dilatation with 3.25X8 mm [place] balloon at 16 atm Rest of the moderate residual disease best treated medically MNigel Mormon MD Pager: [phone]: [phone] ECHOCARDIOGRAM COMPLETE  Result Date: 04/22/2021    ECHOCARDIOGRAM REPORT   Patient Name:   Ariana SOBOCINSKIDate of Exam: 04/22/2021 Medical Rec #:  [id number]        Height:       63.0 in Accession #:    [id number]       Weight:       236.0 lb Date of Birth:  [birth year]        BSA:          2.075 m Patient Age:    89years          BP:           127/94 mmHg Patient Gender: F  HR:           78 bpm. Exam Location:  Inpatient Procedure: 2D Echo, 3D Echo, Cardiac Doppler and Color Doppler STAT ECHO Indications:     R07.9* Chest pain, unspecified. Elevated troponin  History:         Patient has no prior history of Echocardiogram examinations.                  Abnormal ECG, Signs/Symptoms:Chest Pain; Risk                  Factors:Hypertension. ETOH.  Sonographer:     Roseanna Rainbow RDCS Referring Phys:  [id number] [facility] PATWARDHAN Diagnosing Phys: Vernell Leep MD IMPRESSIONS  1. Left ventricular ejection fraction, by estimation, is 55 to 60%. The left ventricle has normal function. The left ventricle demonstrates regional wall motion abnormalities (see scoring diagram/findings for description). Left ventricular diastolic parameters were normal. There is mild hypokinesis of the left ventricular, basal inferolateral wall.  2. Right ventricular systolic function is low normal. The right ventricular size is not well visualized.  3. The mitral valve is normal in structure. No evidence of mitral valve regurgitation. No evidence of mitral stenosis.  4. The aortic valve is normal in structure. Aortic valve regurgitation is not visualized. No aortic stenosis is present. FINDINGS  Left Ventricle: Left ventricular ejection fraction, by estimation, is 55 to 60%. The left ventricle has normal function. The left ventricle demonstrates regional wall motion abnormalities. Mild hypokinesis of the left ventricular, basal inferolateral wall. The left ventricular  internal cavity size was normal in size. There is no left ventricular hypertrophy. Left ventricular diastolic parameters were normal. Right Ventricle: The right ventricular size is not well visualized. Right vetricular wall thickness was not assessed. Right ventricular systolic function is low normal. Left Atrium: Left atrial size was normal in size. Right Atrium: Right atrial size was normal in size. Pericardium: There is no evidence of pericardial effusion. Mitral Valve: The mitral valve is normal in structure. No evidence of mitral valve regurgitation. No evidence of mitral valve stenosis. Tricuspid Valve: The tricuspid valve is normal in structure. Tricuspid valve regurgitation is not demonstrated. No evidence of tricuspid stenosis. Aortic Valve: The aortic valve is normal in structure. Aortic valve regurgitation is not visualized. No aortic stenosis is present. Pulmonic Valve: The pulmonic valve was normal in structure. Pulmonic valve regurgitation is not visualized. No evidence of pulmonic stenosis. Aorta: The aortic root is normal in size and structure. IAS/Shunts: No atrial level shunt detected by color flow Doppler.  LEFT VENTRICLE PLAX 2D LVIDd:         4.10 cm     Diastology LVIDs:         2.70 cm     LV e' medial:    10.60 cm/s LV PW:         1.20 cm     LV E/e' medial:  8.0 LV IVS:        1.20 cm     LV e' lateral:   17.80 cm/s LVOT diam:     2.20 cm     LV E/e' lateral: 4.8 LV SV:         59 LV SV Index:   29 LVOT Area:     3.80 cm                             3D Volume EF: LV Volumes (MOD)  3D EF:        57 % LV vol d, MOD A2C: 84.1 ml LV EDV:       113 ml LV vol d, MOD A4C: 64.8 ml LV ESV:       48 ml LV vol s, MOD A2C: 36.0 ml LV SV:        65 ml LV vol s, MOD A4C: 28.8 ml LV SV MOD A2C:     48.1 ml LV SV MOD A4C:     64.8 ml LV SV MOD BP:      41.7 ml RIGHT VENTRICLE             IVC RV S prime:     12.50 cm/s  IVC diam: 1.50 cm TAPSE (M-mode): 1.4 cm LEFT ATRIUM             Index        RIGHT ATRIUM          Index LA diam:        2.80 cm 1.35 cm/m  RA Area:     9.95 cm LA Vol (A2C):   19.0 ml 9.16 ml/m  RA Volume:   18.50 ml 8.92 ml/m LA Vol (A4C):   13.9 ml 6.70 ml/m LA Biplane Vol: 16.9 ml 8.15 ml/m  AORTIC VALVE LVOT Vmax:   96.70 cm/s LVOT Vmean:  61.300 cm/s LVOT VTI:    0.156 m  AORTA Ao Root diam: 2.90 cm Ao Asc diam:  3.10 cm MITRAL VALVE MV Area (PHT): 4.86 cm    SHUNTS MV Decel Time: 156 msec    Systemic VTI:  0.16 m MV E velocity: 85.10 cm/s  Systemic Diam: 2.20 cm MV A velocity: 68.30 cm/s MV E/A ratio:  1.25 Manish Patwardhan MD Electronically signed by Vernell Leep MD Signature Date/Time: 04/22/2021/12:51:59 PM    Final       FOLLOW UP PLANS AND APPOINTMENTS Discharge Instructions     Amb Referral to Cardiac Rehabilitation   Complete by: As directed    Diagnosis:  Coronary Stents NSTEMI PTCA     After initial evaluation and assessments completed: Virtual Based Care may be provided alone or in conjunction with Phase 2 Cardiac Rehab based on patient barriers.: Yes   Diet - low sodium heart healthy   Complete by: As directed    Increase activity slowly   Complete by: As directed       Allergies as of 04/24/2021   No Known Allergies      Medication List     STOP taking these medications    hydrochlorothiazide 25 MG tablet Commonly known as: HYDRODIURIL   naproxen 500 MG tablet Commonly known as: NAPROSYN       TAKE these medications    aspirin 81 MG EC tablet Take 1 tablet (81 mg total) by mouth daily. Swallow whole.   atorvastatin 80 MG tablet Commonly known as: LIPITOR Take 1 tablet (80 mg total) by mouth daily.   diclofenac Sodium 1 % Gel Commonly known as: VOLTAREN Apply 2 g topically daily as needed for pain.   diltiazem 240 MG 24 hr capsule Commonly known as: CARDIZEM CD TAKE 1 CAPSULE(240 MG) BY MOUTH DAILY What changed: See the new instructions.   folic acid 1 MG tablet Commonly known as: FOLVITE Take 1 mg by  mouth daily.   linaclotide 72 MCG capsule Commonly known as: LINZESS Take 72 mcg by mouth daily before breakfast.   lisinopril 2.5 MG tablet Commonly known  as: ZESTRIL Take 1 tablet (2.5 mg total) by mouth daily.   metoCLOPramide 10 MG tablet Commonly known as: REGLAN Take 10 mg by mouth every 8 (eight) hours as needed for nausea.   metoprolol tartrate 25 MG tablet Commonly known as: LOPRESSOR Take 1 tablet (25 mg total) by mouth 2 (two) times daily.   multivitamin capsule Take 1 capsule by mouth daily.   nitroGLYCERIN 0.4 MG SL tablet Commonly known as: NITROSTAT Place 1 tablet (0.4 mg total) under the tongue every 5 (five) minutes as needed for chest pain.   nortriptyline 50 MG capsule Commonly known as: PAMELOR Take 50 mg by mouth at bedtime.   omeprazole 40 MG capsule Commonly known as: PRILOSEC Take 40 mg by mouth daily.   potassium chloride SA 20 MEQ tablet Commonly known as: KLOR-CON M Take 1 tablet (20 mEq total) by mouth daily.   sertraline 100 MG tablet Commonly known as: ZOLOFT Take 1 tablet (100 mg total) by mouth daily.   ticagrelor 90 MG Tabs tablet Commonly known as: Brilinta Take 1 tablet (90 mg total) by mouth 2 (two) times daily.   tiZANidine 4 MG tablet Commonly known as: ZANAFLEX Take 4 mg by mouth every 8 (eight) hours as needed for muscle spasms.        Follow-up Information     Nigel Mormon, MD Follow up on 05/07/2021.   Specialties: Cardiology, Radiology Why: 2:15 PM Contact information: [address] [id number] [phone]                Total time spent: Greater than 35 minutes.  Rex Kras, [place], [facility]  Pager: [phone] Office: [phone]

## 2021-04-24 NOTE — Progress Notes (Signed)
Education completed re: angina symptoms, NTG usage, cath site care, exercise guidelines, activity restrictions and progression. Referred to phase II cardiac rehab in [place]. [phone]

## 2021-04-29 ENCOUNTER — Other Ambulatory Visit (HOSPITAL_COMMUNITY): Payer: Self-pay

## 2021-04-29 ENCOUNTER — Telehealth (HOSPITAL_COMMUNITY): Payer: Self-pay

## 2021-04-29 NOTE — Telephone Encounter (Signed)
Transitions of Care Pharmacy  ° °Call attempted for a pharmacy transitions of care follow-up. HIPAA appropriate voicemail was left. ° °Call attempt #1. Will follow-up in 2-3 days.  °  °

## 2021-04-30 ENCOUNTER — Telehealth: Payer: Self-pay

## 2021-04-30 ENCOUNTER — Telehealth (HOSPITAL_COMMUNITY): Payer: Self-pay

## 2021-04-30 ENCOUNTER — Other Ambulatory Visit (HOSPITAL_COMMUNITY): Payer: Self-pay

## 2021-04-30 NOTE — Telephone Encounter (Signed)
Transitions of Care Pharmacy   Call attempted for a pharmacy transitions of care follow-up. Unable to leave voicemail.   Call attempt #2. Will follow-up in 2-3 days.    

## 2021-04-30 NOTE — Telephone Encounter (Signed)
Attempted to call patient in regards to Cardiac Rehab - unable to leave voicemail, VM box not set up

## 2021-04-30 NOTE — Telephone Encounter (Signed)
Bruising is expected as she had hematoma in the arm. This will get better with time. As long as her sensation and strength in the arm is getting better, we do not need to worry. Use tylenol for right arm pain, up to 3-4 pills day. Keep 3/2 appt. Make one sooner, if needed.  Thanks MJP

## 2021-04-30 NOTE — Telephone Encounter (Signed)
Pt insurance is active and benefits verified through [facility]. Co-pay $3.00, DED $0.00/$0.00 met, out of pocket $0.00/$0.00 met, co-insurance 0%. No pre-authorization required. [employer], 04/30/21 @ 3:15PM, QVO#[id number]   Will contact patient to see if she is interested in the Cardiac Rehab Program. If interested, patient will need to complete follow up appt. Once completed, patient will be contacted for scheduling upon review by the RN Navigator.

## 2021-05-01 ENCOUNTER — Telehealth (HOSPITAL_COMMUNITY): Payer: Self-pay

## 2021-05-01 NOTE — Telephone Encounter (Signed)
Tried patient twice no answer unable to leave vm

## 2021-05-01 NOTE — Telephone Encounter (Signed)
Transitions of Care Pharmacy  ° °Call attempted for a pharmacy transitions of care follow-up. Unable to reach patient, voicemail full.  ° °Call attempt #3. Will no longer attempt to reach patient.  °  °

## 2021-05-05 ENCOUNTER — Other Ambulatory Visit (HOSPITAL_COMMUNITY): Payer: Self-pay

## 2021-05-05 NOTE — Telephone Encounter (Signed)
Called pt, no answer. Left vm requesting call back?

## 2021-05-07 ENCOUNTER — Encounter: Payer: Self-pay | Admitting: Cardiology

## 2021-05-07 ENCOUNTER — Ambulatory Visit: Payer: Medicaid Other | Admitting: Cardiology

## 2021-05-07 ENCOUNTER — Other Ambulatory Visit: Payer: Self-pay

## 2021-05-07 VITALS — BP 111/74 | HR 92 | Temp 98.2°F | Resp 17 | Ht 63.0 in | Wt 243.2 lb

## 2021-05-07 DIAGNOSIS — E782 Mixed hyperlipidemia: Secondary | ICD-10-CM | POA: Insufficient documentation

## 2021-05-07 DIAGNOSIS — I10 Essential (primary) hypertension: Secondary | ICD-10-CM

## 2021-05-07 DIAGNOSIS — I2510 Atherosclerotic heart disease of native coronary artery without angina pectoris: Secondary | ICD-10-CM | POA: Insufficient documentation

## 2021-05-07 DIAGNOSIS — E7841 Elevated Lipoprotein(a): Secondary | ICD-10-CM

## 2021-05-07 MED ORDER — TICAGRELOR 90 MG PO TABS: 90.0000 mg | ORAL_TABLET | Freq: Two times a day (BID) | ORAL | 3 refills | Status: DC

## 2021-05-07 MED ORDER — LISINOPRIL 2.5 MG PO TABS: 2.5000 mg | ORAL_TABLET | Freq: Every day | ORAL | 3 refills | Status: DC

## 2021-05-07 MED ORDER — ATORVASTATIN CALCIUM 80 MG PO TABS: 80.0000 mg | ORAL_TABLET | Freq: Every day | ORAL | 3 refills | Status: DC

## 2021-05-07 MED ORDER — METOPROLOL TARTRATE 25 MG PO TABS: 25.0000 mg | ORAL_TABLET | Freq: Two times a day (BID) | ORAL | 3 refills | Status: DC

## 2021-05-07 MED ORDER — NITROGLYCERIN 0.4 MG SL SUBL: 0.4000 mg | SUBLINGUAL_TABLET | SUBLINGUAL | 1 refills | Status: DC | PRN

## 2021-05-07 MED ORDER — ASPIRIN 81 MG PO TBEC: 81.0000 mg | DELAYED_RELEASE_TABLET | Freq: Every day | ORAL | 3 refills | Status: DC

## 2021-05-07 NOTE — Progress Notes (Signed)
Follow up visit  Subjective:   [name], female    DOB: 10-24-1980, 41 y.o.   MRN: [id number]   HPI  Chief Complaint  Patient presents with   Follow-up   nonstemi   Symptomatic PVCs    41 y.o. African-American female with hypertension, obesity, prediabetes, mixed hyperlipidemia, elevated lipoprotein (a), CAD, s/p non-STEMI requiring PCI to proximal left circumflex in 04/2021.  Patient was admitted to [facility] in 04/2021 with chest pain, high sensitive troponin rechecked 22,000.  Coronary angiogram showed severe proximal left circumflex and wrist moderate disease.  She underwent PCI to proximal left circumflex with excellent results.  Post PCI stay was complicated with right forearm hematoma.  Fortunately, she did not require any surgical intervention.  Hematoma is now resolving, bruising and some discomfort remains but improved compared to discharge day.  Subsequent work-up showed lipoprotein a elevated at 296.  Patient is currently compliant with medical therapy and starting cardiac rehab.  Since hospital discharge, patient had 1 episode of chest pain reported.  Otherwise, patient is doing well, able to walk without any symptoms.    Current Outpatient Medications:    aspirin 81 MG EC tablet, Take 1 tablet (81 mg total) by mouth daily. Swallow whole., Disp: 30 tablet, Rfl: 1   atorvastatin (LIPITOR) 80 MG tablet, Take 1 tablet (80 mg total) by mouth daily., Disp: 30 tablet, Rfl: 1   diclofenac Sodium (VOLTAREN) 1 % GEL, Apply 2 g topically daily as needed for pain., Disp: , Rfl:    diltiazem (CARDIZEM CD) 240 MG 24 hr capsule, TAKE 1 CAPSULE(240 MG) BY MOUTH DAILY (Patient taking differently: 240 mg daily.), Disp: 30 capsule, Rfl: 3   folic acid (FOLVITE) 1 MG tablet, Take 1 mg by mouth daily., Disp: , Rfl:    linaclotide (LINZESS) 72 MCG capsule, Take 72 mcg by mouth daily before breakfast., Disp: , Rfl:    lisinopril (ZESTRIL) 2.5 MG tablet, Take 1 tablet (2.5 mg  total) by mouth daily., Disp: 30 tablet, Rfl: 1   metoCLOPramide (REGLAN) 10 MG tablet, Take 10 mg by mouth every 8 (eight) hours as needed for nausea., Disp: , Rfl:    metoprolol tartrate (LOPRESSOR) 25 MG tablet, Take 1 tablet (25 mg total) by mouth 2 (two) times daily., Disp: 60 tablet, Rfl: 1   Multiple Vitamin (MULTIVITAMIN) capsule, Take 1 capsule by mouth daily., Disp: , Rfl:    nitroGLYCERIN (NITROSTAT) 0.4 MG SL tablet, Place 1 tablet (0.4 mg total) under the tongue every 5 (five) minutes as needed for chest pain., Disp: 30 tablet, Rfl: 1   nortriptyline (PAMELOR) 50 MG capsule, Take 50 mg by mouth at bedtime., Disp: , Rfl:    omeprazole (PRILOSEC) 40 MG capsule, Take 40 mg by mouth daily., Disp: , Rfl:    potassium chloride 20 MEQ/15ML (10%) SOLN, Take 15 mls by mouth with water once daily., Disp: 450 mL, Rfl: 0   sertraline (ZOLOFT) 100 MG tablet, Take 1 tablet (100 mg total) by mouth daily., Disp: 30 tablet, Rfl: 1   ticagrelor (BRILINTA) 90 MG TABS tablet, Take 1 tablet (90 mg total) by mouth 2 (two) times daily., Disp: 60 tablet, Rfl: 1   tiZANidine (ZANAFLEX) 4 MG tablet, Take 4 mg by mouth every 8 (eight) hours as needed for muscle spasms., Disp: , Rfl:    Cardiovascular & other pertient studies:  Reviewed external labs and tests, independently interpreted  EKG 05/07/2021: Sinus rhythm 83 bpm Left axis deviation Borderline  LVH  Coronary intervention 04/22/2021: LM: Normal LAD: Normal Lcx: Large, dominant vessel        Prox 80% eccentric stenosis        Lateral branch off OM1 with ostial 70% stenosis        OM3 ostial 70% stenosis RCA: Nondominant   LVEDP normal   Successful percutaneous coronary intervention prox LCx        PTCA and stent placement 3.0 X 18 mm Onyx Frontier drug-eluting stent        Post dilatation with 3.25X8 mm [place] balloon at 16 atm   Rest of the moderate residual disease best treated medically  Echocardiogram 04/22/2021: 1. Left ventricular  ejection fraction, by estimation, is 55 to 60%. The  left ventricle has normal function. The left ventricle demonstrates  regional wall motion abnormalities (see scoring diagram/findings for  description). Left ventricular diastolic  parameters were normal. There is mild hypokinesis of the left ventricular,  basal inferolateral wall.   2. Right ventricular systolic function is low normal. The right  ventricular size is not well visualized.   3. The mitral valve is normal in structure. No evidence of mitral valve  regurgitation. No evidence of mitral stenosis.   4. The aortic valve is normal in structure. Aortic valve regurgitation is  not visualized. No aortic stenosis is present.    Recent labs: 04/24/2021: Glucose 139, BUN/Cr 13/0.91. EGFR >60. Na/K 140/3.4. Rest of the CMP normal H/H 12/37. MCV 88. Platelets 335 HbA1C 6.0% Chol 239, TG 79, HDL 46, LDL 172 Lipoprotein (a) 296   Latest Reference Range & Units 04/22/21 06:31 04/22/21 09:40 04/22/21 11:25  Troponin I (High Sensitivity) <18 ng/L 29 (H) 9,668 (HH) 22,926 (HH)  (HH): Data is critically high (H): Data is abnormally high    Review of Systems  Cardiovascular:  Negative for chest pain, dyspnea on exertion, leg swelling, palpitations and syncope.  Musculoskeletal:        Right forearm pain, improving        Vitals:   05/07/21 1422  BP: 111/74  Pulse: 92  Resp: 17  Temp: 98.2 F (36.8 C)    Body mass index is 43.08 kg/m. Filed Weights   05/07/21 1422  Weight: 243 lb 3.2 oz (110.3 kg)     Objective:   Physical Exam Vitals and nursing note reviewed.  Constitutional:      General: She is not in acute distress. Neck:     Vascular: No JVD.  Cardiovascular:     Rate and Rhythm: Normal rate and regular rhythm.     Heart sounds: Normal heart sounds. No murmur heard. Pulmonary:     Effort: Pulmonary effort is normal.     Breath sounds: Normal breath sounds. No wheezing or rales.  Musculoskeletal:      Right lower leg: No edema.     Left lower leg: No edema.     Comments: Rt forearm ecchymosis, improving 2+ radial pulse            Visit diagnoses:   ICD-10-CM   1. Coronary artery disease involving native coronary artery of native heart without angina pectoris  I25.10 EKG 12-Lead    Lipid panel    aspirin 81 MG EC tablet    atorvastatin (LIPITOR) 80 MG tablet    lisinopril (ZESTRIL) 2.5 MG tablet    metoprolol tartrate (LOPRESSOR) 25 MG tablet    nitroGLYCERIN (NITROSTAT) 0.4 MG SL tablet    ticagrelor (BRILINTA) 90 MG TABS tablet  2. Mixed hyperlipidemia  E78.2 Lipid panel    3. Elevated lipoprotein(a)  E78.41     4. Essential hypertension, benign  I10        Orders Placed This Encounter  Procedures   Lipid panel   EKG 12-Lead   Meds ordered this encounter  Medications   aspirin 81 MG EC tablet    Sig: Take 1 tablet (81 mg total) by mouth daily. Swallow whole.    Dispense:  90 tablet    Refill:  3   atorvastatin (LIPITOR) 80 MG tablet    Sig: Take 1 tablet (80 mg total) by mouth daily.    Dispense:  90 tablet    Refill:  3   lisinopril (ZESTRIL) 2.5 MG tablet    Sig: Take 1 tablet (2.5 mg total) by mouth daily.    Dispense:  90 tablet    Refill:  3   metoprolol tartrate (LOPRESSOR) 25 MG tablet    Sig: Take 1 tablet (25 mg total) by mouth 2 (two) times daily.    Dispense:  180 tablet    Refill:  3   nitroGLYCERIN (NITROSTAT) 0.4 MG SL tablet    Sig: Place 1 tablet (0.4 mg total) under the tongue every 5 (five) minutes as needed for chest pain.    Dispense:  30 tablet    Refill:  1   ticagrelor (BRILINTA) 90 MG TABS tablet    Sig: Take 1 tablet (90 mg total) by mouth 2 (two) times daily.    Dispense:  180 tablet    Refill:  3      Assessment & Recommendations:    41 y.o. African-American female with hypertension, obesity, prediabetes, mixed hyperlipidemia, elevated lipoprotein (a), CAD, s/p non-STEMI requiring PCI to proximal left circumflex in  04/2021.  CAD: S/p PCI to Prox Lcx severe stenosis Rest moderate disease medically treated Recommend DAPT with Aspirin and Brilinta till 04/2022 LDL 172, lipoprotein (a) 296. Continue lipitor 80 mg daily. Check lipid panel in 1 month. If LDL remains >70, will add Repatha Referred to clinical trial Ocean a for patients with MI and elevated lipoprotein (A). Right foreream ecchymosis improving., Referred to cardiac rehab  Can return to work on 05/18/2021-should not have any weight lifting restrictions by then     Nigel Mormon, MD Pager: [phone] Office: [phone]

## 2021-05-20 ENCOUNTER — Telehealth: Payer: Self-pay

## 2021-05-20 DIAGNOSIS — Z006 Encounter for examination for normal comparison and control in clinical research program: Secondary | ICD-10-CM

## 2021-05-20 NOTE — Telephone Encounter (Signed)
Pt called and stated the she has been having nose bleeds. She said it started last week and has gotten worse. She has blood clots coming out as well. She is unsure if this is medication related and is not sure what to do. Please advise.  ?

## 2021-05-20 NOTE — Research (Signed)
Called patient to discuss the OCEAN(a) trial. Patient didn't answer and a voice message was left asking for a return call. An e-mail was also sent with the ICF attached. ?

## 2021-05-21 NOTE — Telephone Encounter (Signed)
Recommend rest, pinching the nose with tilting the head back. Afrin nasal spray can help. Avoid picking, use humidifier. Needs to be evaluated in urgent care/ER/ENT if has large clots, could need packing. If bleeding does not stop, could need to hold blood thinners, but avoid if possible. ? ?Thanks ?MJP ? ?

## 2021-05-21 NOTE — Telephone Encounter (Signed)
Called pt, no answer. Left vm requesting call back?

## 2021-05-25 NOTE — Telephone Encounter (Signed)
Called pt, no answer. Left vm requesting call back?

## 2021-05-26 NOTE — Telephone Encounter (Signed)
Third attempt to call pt, no answer. Left vm requesting call back. No attempt has been made to contact us at this time.

## 2021-06-06 LAB — LIPID PANEL
Chol/HDL Ratio: 3.5 ratio (ref 0.0–4.4)
Cholesterol, Total: 191 mg/dL (ref 100–199)
HDL: 54 mg/dL (ref >39)
LDL Chol Calc (NIH): 117 mg/dL — ABNORMAL HIGH (ref 0–99)
Triglycerides: 112 mg/dL (ref 0–149)
VLDL Cholesterol Cal: 20 mg/dL (ref 5–40)

## 2021-06-09 ENCOUNTER — Encounter (HOSPITAL_COMMUNITY): Payer: Self-pay

## 2021-06-09 ENCOUNTER — Telehealth (HOSPITAL_COMMUNITY): Payer: Self-pay

## 2021-06-09 NOTE — Telephone Encounter (Signed)
Attempted to call patient in regards to Cardiac Rehab - LM on VM Mailed letter 

## 2021-06-16 NOTE — Progress Notes (Signed)
Please check if the patient is taking Lipitor 80 mg. ? ?Thanks ?MJP ? ?

## 2021-06-17 DIAGNOSIS — Z006 Encounter for examination for normal comparison and control in clinical research program: Secondary | ICD-10-CM

## 2021-06-17 NOTE — Research (Signed)
Called patient to discuss the Ocean(a) study with no answer. A brief message left on voice mail asking for a return call. ?

## 2021-06-17 NOTE — Progress Notes (Signed)
Called pt, no answer. Left vm requesting call back?

## 2021-06-18 NOTE — Progress Notes (Signed)
Called pt, no answer. Left vm requesting call back?

## 2021-06-24 NOTE — Progress Notes (Signed)
Cholesterol is improved, but not optimal. Consider adding Zetia 10 mg daily or Repatha injection once every two weeks. What would she prefer? ? ?Thanks ?MJP ? ?

## 2021-06-24 NOTE — Progress Notes (Signed)
Called and spoke to pt, pt stated that she is taking the Lipitor 80 mg.

## 2021-06-26 NOTE — Progress Notes (Signed)
Called pt no answer, left a vm

## 2021-07-01 ENCOUNTER — Other Ambulatory Visit (HOSPITAL_COMMUNITY): Payer: Self-pay

## 2021-07-01 ENCOUNTER — Other Ambulatory Visit: Payer: Self-pay

## 2021-07-01 MED ORDER — REPATHA SURECLICK 140 MG/ML ~~LOC~~ SOAJ: 1.0000 mL | SUBCUTANEOUS | 0 refills | Status: DC

## 2021-07-01 MED ORDER — REPATHA SURECLICK 140 MG/ML ~~LOC~~ SOAJ
1.0000 mL | SUBCUTANEOUS | 0 refills | Status: DC
  Filled 2021-07-01: qty 6, fill #0

## 2021-07-01 NOTE — Progress Notes (Signed)
Called and spoke to pt, pt voiced understanding. She would prefer the Repatha and would like to come in to the office every 2 weeks to get this done.

## 2021-07-01 NOTE — Progress Notes (Signed)
Please order. ? ?Thanks ?MJP ? ?

## 2021-07-01 NOTE — Progress Notes (Signed)
Repatha has been ordered and updated pts MAR.

## 2021-07-06 ENCOUNTER — Ambulatory Visit: Payer: Medicaid Other | Admitting: Cardiology

## 2021-07-06 DIAGNOSIS — E782 Mixed hyperlipidemia: Secondary | ICD-10-CM

## 2021-07-06 MED ORDER — EVOLOCUMAB 140 MG/ML ~~LOC~~ SOAJ
140.0000 mg | Freq: Once | SUBCUTANEOUS | Status: AC
  Administered 2021-07-06: 140 mg via SUBCUTANEOUS

## 2021-07-07 NOTE — Progress Notes (Signed)
ICD-10-CM   ?1. Mixed hyperlipidemia  E78.2 Evolocumab SOAJ 140 mg  ?  ? ?Administration Action Time Recorded Time Documented By Site Comment Reason Patient Supplied  ?Given : 140 mg :   : Subcutaneous 07/06/21 1256 07/06/21 [place], Amber L Left Anterior Thigh [place] [id number]  ?LOT [id number]  No  ? ?

## 2021-07-08 ENCOUNTER — Telehealth (HOSPITAL_COMMUNITY): Payer: Self-pay

## 2021-07-08 NOTE — Telephone Encounter (Signed)
No response from pt regarding CR.  Closed referral.  

## 2021-07-22 ENCOUNTER — Telehealth: Payer: Self-pay | Admitting: Cardiology

## 2021-07-22 ENCOUNTER — Other Ambulatory Visit: Payer: Self-pay

## 2021-07-22 MED ORDER — REPATHA SURECLICK 140 MG/ML ~~LOC~~ SOAJ: 1.0000 mL | SUBCUTANEOUS | 3 refills | Status: DC

## 2021-07-22 NOTE — Telephone Encounter (Signed)
Called pt no answer left a vm

## 2021-07-22 NOTE — Telephone Encounter (Signed)
Patient doesn't recall who she spoke with yesterday. If someone in clinical called her please call her back. ?

## 2021-07-24 ENCOUNTER — Other Ambulatory Visit (HOSPITAL_COMMUNITY): Payer: Self-pay

## 2021-07-24 ENCOUNTER — Other Ambulatory Visit: Payer: Self-pay

## 2021-07-24 MED ORDER — REPATHA SURECLICK 140 MG/ML ~~LOC~~ SOAJ: 1.0000 mL | SUBCUTANEOUS | 3 refills | Status: DC

## 2021-07-24 MED ORDER — REPATHA SURECLICK 140 MG/ML ~~LOC~~ SOAJ
1.0000 mL | SUBCUTANEOUS | 3 refills | Status: DC
  Filled 2021-07-24: qty 6, fill #0

## 2021-08-06 NOTE — Progress Notes (Unsigned)
Follow up visit  Subjective:   [name], female    DOB: June 19, 1980, 41 y.o.   MRN: [id number]   HPI  No chief complaint on file.   41 y.o. African-American female with hypertension, obesity, prediabetes, mixed hyperlipidemia, elevated lipoprotein (a), CAD, s/p non-STEMI requiring PCI to proximal left circumflex in 04/2021.  Patient was admitted to [facility] in 04/2021 with chest pain, high sensitive troponin rechecked 22,000.  Coronary angiogram showed severe proximal left circumflex and wrist moderate disease.  She underwent PCI to proximal left circumflex with excellent results.  Post PCI stay was complicated with right forearm hematoma.  Fortunately, she did not require any surgical intervention.  Hematoma is now resolving, bruising and some discomfort remains but improved compared to discharge day.  Subsequent work-up showed lipoprotein a elevated at 296.  ***Patient is currently compliant with medical therapy and starting cardiac rehab.  ***Since hospital discharge, patient had 1 episode of chest pain reported.  ***Otherwise, patient is doing well, able to walk without any symptoms.    Reviewed recent test results with the patient, details below.    Current Outpatient Medications:    aspirin 81 MG EC tablet, Take 1 tablet (81 mg total) by mouth daily. Swallow whole., Disp: 90 tablet, Rfl: 3   atorvastatin (LIPITOR) 80 MG tablet, Take 1 tablet (80 mg total) by mouth daily., Disp: 90 tablet, Rfl: 3   diclofenac Sodium (VOLTAREN) 1 % GEL, Apply 2 g topically daily as needed for pain., Disp: , Rfl:    diltiazem (CARDIZEM CD) 240 MG 24 hr capsule, TAKE 1 CAPSULE(240 MG) BY MOUTH DAILY (Patient taking differently: 240 mg daily.), Disp: 30 capsule, Rfl: 3   Evolocumab (REPATHA SURECLICK) 330 MG/ML SOAJ, Inject 1 mL into the skin every 14 (fourteen) days., Disp: 6 mL, Rfl: 3   folic acid (FOLVITE) 1 MG tablet, Take 1 mg by mouth daily., Disp: , Rfl:    linaclotide  (LINZESS) 72 MCG capsule, Take 72 mcg by mouth daily before breakfast., Disp: , Rfl:    lisinopril (ZESTRIL) 2.5 MG tablet, Take 1 tablet (2.5 mg total) by mouth daily., Disp: 90 tablet, Rfl: 3   metoCLOPramide (REGLAN) 10 MG tablet, Take 10 mg by mouth every 8 (eight) hours as needed for nausea., Disp: , Rfl:    metoprolol tartrate (LOPRESSOR) 25 MG tablet, Take 1 tablet (25 mg total) by mouth 2 (two) times daily., Disp: 180 tablet, Rfl: 3   Multiple Vitamin (MULTIVITAMIN) capsule, Take 1 capsule by mouth daily., Disp: , Rfl:    nitroGLYCERIN (NITROSTAT) 0.4 MG SL tablet, Place 1 tablet (0.4 mg total) under the tongue every 5 (five) minutes as needed for chest pain., Disp: 30 tablet, Rfl: 1   nortriptyline (PAMELOR) 50 MG capsule, Take 50 mg by mouth at bedtime., Disp: , Rfl:    omeprazole (PRILOSEC) 40 MG capsule, Take 1 capsule by mouth daily., Disp: , Rfl:    potassium chloride 20 MEQ/15ML (10%) SOLN, Take 15 mls by mouth with water once daily., Disp: 450 mL, Rfl: 0   sertraline (ZOLOFT) 100 MG tablet, Take 1 tablet (100 mg total) by mouth daily., Disp: 30 tablet, Rfl: 1   sertraline (ZOLOFT) 100 MG tablet, Take 1 tablet by mouth as needed., Disp: , Rfl:    ticagrelor (BRILINTA) 90 MG TABS tablet, Take 1 tablet (90 mg total) by mouth 2 (two) times daily., Disp: 180 tablet, Rfl: 3   tiZANidine (ZANAFLEX) 4 MG tablet, Take 4 mg  by mouth every 8 (eight) hours as needed for muscle spasms., Disp: , Rfl:    Ubrogepant (UBRELVY) 50 MG TABS, Take 1 tablet by mouth as needed., Disp: , Rfl:    Cardiovascular & other pertient studies:  Reviewed external labs and tests, independently interpreted  EKG 05/07/2021: Sinus rhythm 83 bpm Left axis deviation Borderline LVH  Coronary intervention 04/22/2021: LM: Normal LAD: Normal Lcx: Large, dominant vessel        Prox 80% eccentric stenosis        Lateral branch off OM1 with ostial 70% stenosis        OM3 ostial 70% stenosis RCA: Nondominant   LVEDP  normal   Successful percutaneous coronary intervention prox LCx        PTCA and stent placement 3.0 X 18 mm Onyx Frontier drug-eluting stent        Post dilatation with 3.25X8 mm [place] balloon at 16 atm   Rest of the moderate residual disease best treated medically  Echocardiogram 04/22/2021: 1. Left ventricular ejection fraction, by estimation, is 55 to 60%. The  left ventricle has normal function. The left ventricle demonstrates  regional wall motion abnormalities (see scoring diagram/findings for  description). Left ventricular diastolic  parameters were normal. There is mild hypokinesis of the left ventricular,  basal inferolateral wall.   2. Right ventricular systolic function is low normal. The right  ventricular size is not well visualized.   3. The mitral valve is normal in structure. No evidence of mitral valve  regurgitation. No evidence of mitral stenosis.   4. The aortic valve is normal in structure. Aortic valve regurgitation is  not visualized. No aortic stenosis is present.    Recent labs: 06/05/2021: Chol 191, TG 112, HDL 54, LDL 117  04/24/2021: Glucose 139, BUN/Cr 13/0.91. EGFR >60. Na/K 140/3.4. Rest of the CMP normal H/H 12/37. MCV 88. Platelets 335 HbA1C 6.0% Chol 239, TG 79, HDL 46, LDL 172 Lipoprotein (a) 296   Latest Reference Range & Units 04/22/21 06:31 04/22/21 09:40 04/22/21 11:25  Troponin I (High Sensitivity) <18 ng/L 29 (H) 9,668 (HH) 22,926 (HH)  (HH): Data is critically high (H): Data is abnormally high    Review of Systems  Cardiovascular:  Negative for chest pain, dyspnea on exertion, leg swelling, palpitations and syncope.  Musculoskeletal:        Right forearm pain, improving        There were no vitals filed for this visit.   There is no height or weight on file to calculate BMI. There were no vitals filed for this visit.    Objective:   Physical Exam Vitals and nursing note reviewed.  Constitutional:      General: She is  not in acute distress. Neck:     Vascular: No JVD.  Cardiovascular:     Rate and Rhythm: Normal rate and regular rhythm.     Heart sounds: Normal heart sounds. No murmur heard. Pulmonary:     Effort: Pulmonary effort is normal.     Breath sounds: Normal breath sounds. No wheezing or rales.  Musculoskeletal:     Right lower leg: No edema.     Left lower leg: No edema.     Comments: Rt forearm ecchymosis, improving 2+ radial pulse            Visit diagnoses: No diagnosis found.    No orders of the defined types were placed in this encounter.  No orders of the defined types were placed  in this encounter.     Assessment & Recommendations:    41 y.o. African-American female with hypertension, obesity, prediabetes, mixed hyperlipidemia, elevated lipoprotein (a), CAD, s/p non-STEMI requiring PCI to proximal left circumflex in 04/2021.  CAD: S/p PCI to Prox Lcx severe stenosis Rest moderate disease medically treated Recommend DAPT with Aspirin and Brilinta till 04/2022 LDL 172, lipoprotein (a) 296 (04/2021). LDL down to 117 on ***Lipitor ***. ***Repatha Referred to clinical trial [facility] a for patients with MI and elevated lipoprotein (A). ***cardiac rehab  ***    Nigel Mormon, MD Pager: [phone] Office: [phone]

## 2021-08-07 ENCOUNTER — Ambulatory Visit: Payer: Medicaid Other | Admitting: Cardiology

## 2021-08-07 ENCOUNTER — Encounter: Payer: Self-pay | Admitting: Cardiology

## 2021-08-07 VITALS — BP 129/89 | HR 80 | Temp 98.6°F | Resp 16 | Ht 63.0 in | Wt 228.0 lb

## 2021-08-07 DIAGNOSIS — I2510 Atherosclerotic heart disease of native coronary artery without angina pectoris: Secondary | ICD-10-CM

## 2021-08-07 DIAGNOSIS — E782 Mixed hyperlipidemia: Secondary | ICD-10-CM

## 2021-08-21 ENCOUNTER — Encounter (HOSPITAL_COMMUNITY): Payer: Self-pay

## 2021-08-31 ENCOUNTER — Ambulatory Visit: Payer: Medicaid Other

## 2021-08-31 DIAGNOSIS — I2510 Atherosclerotic heart disease of native coronary artery without angina pectoris: Secondary | ICD-10-CM

## 2021-09-07 ENCOUNTER — Telehealth (HOSPITAL_COMMUNITY): Payer: Self-pay

## 2021-09-07 NOTE — Telephone Encounter (Signed)
No response from pt in regards to cardiac rehab. Closed referral 

## 2021-09-25 ENCOUNTER — Telehealth: Payer: Self-pay

## 2021-09-25 ENCOUNTER — Other Ambulatory Visit: Payer: Self-pay | Admitting: Cardiology

## 2021-09-25 DIAGNOSIS — R233 Spontaneous ecchymoses: Secondary | ICD-10-CM

## 2021-09-25 NOTE — Telephone Encounter (Signed)
Pt called to inform us that she is having purple spots on her arms. Pt mention it started after she left the hospital 04/22/2021. Please advise

## 2021-09-25 NOTE — Telephone Encounter (Signed)
Spoke with the patient. Possibly superficial petechiae. No other bleeding. CBC ordered. Patient will call back if these are no better.    Thanks MJP

## 2021-10-13 ENCOUNTER — Other Ambulatory Visit: Payer: Self-pay

## 2021-10-13 MED ORDER — REPATHA SURECLICK 140 MG/ML ~~LOC~~ SOAJ: 1.0000 mL | SUBCUTANEOUS | 3 refills | Status: DC

## 2021-10-14 LAB — CBC
Hematocrit: 35.2 % (ref 34.0–46.6)
Hemoglobin: 11.7 g/dL (ref 11.1–15.9)
MCH: 29.6 pg (ref 26.6–33.0)
MCHC: 33.2 g/dL (ref 31.5–35.7)
MCV: 89 fL (ref 79–97)
Platelets: 305 x10E3/uL (ref 150–450)
RBC: 3.95 x10E6/uL (ref 3.77–5.28)
RDW: 13.2 % (ref 11.7–15.4)
WBC: 8 x10E3/uL (ref 3.4–10.8)

## 2021-10-14 LAB — LIPID PANEL
Chol/HDL Ratio: 2.1 ratio (ref 0.0–4.4)
Cholesterol, Total: 126 mg/dL (ref 100–199)
HDL: 61 mg/dL (ref >39)
LDL Chol Calc (NIH): 44 mg/dL (ref 0–99)
Triglycerides: 118 mg/dL (ref 0–149)
VLDL Cholesterol Cal: 21 mg/dL (ref 5–40)

## 2021-11-04 ENCOUNTER — Other Ambulatory Visit: Payer: Self-pay | Admitting: Physician Assistant

## 2021-11-04 DIAGNOSIS — Z1231 Encounter for screening mammogram for malignant neoplasm of breast: Secondary | ICD-10-CM

## 2021-11-14 IMAGING — DX DG CHEST 1V PORT
1 series · 1 of 1 positions shown · non-contrast
Comparison: Portable exam 4956 hours compared to 08/09/2019

CLINICAL DATA: Chest pain, nausea, vomiting, and abdominal pain for
4 days, on antibiotics for UTI, RIGHT arm abscess

EXAM:
PORTABLE CHEST 1 VIEW

[chest ap]
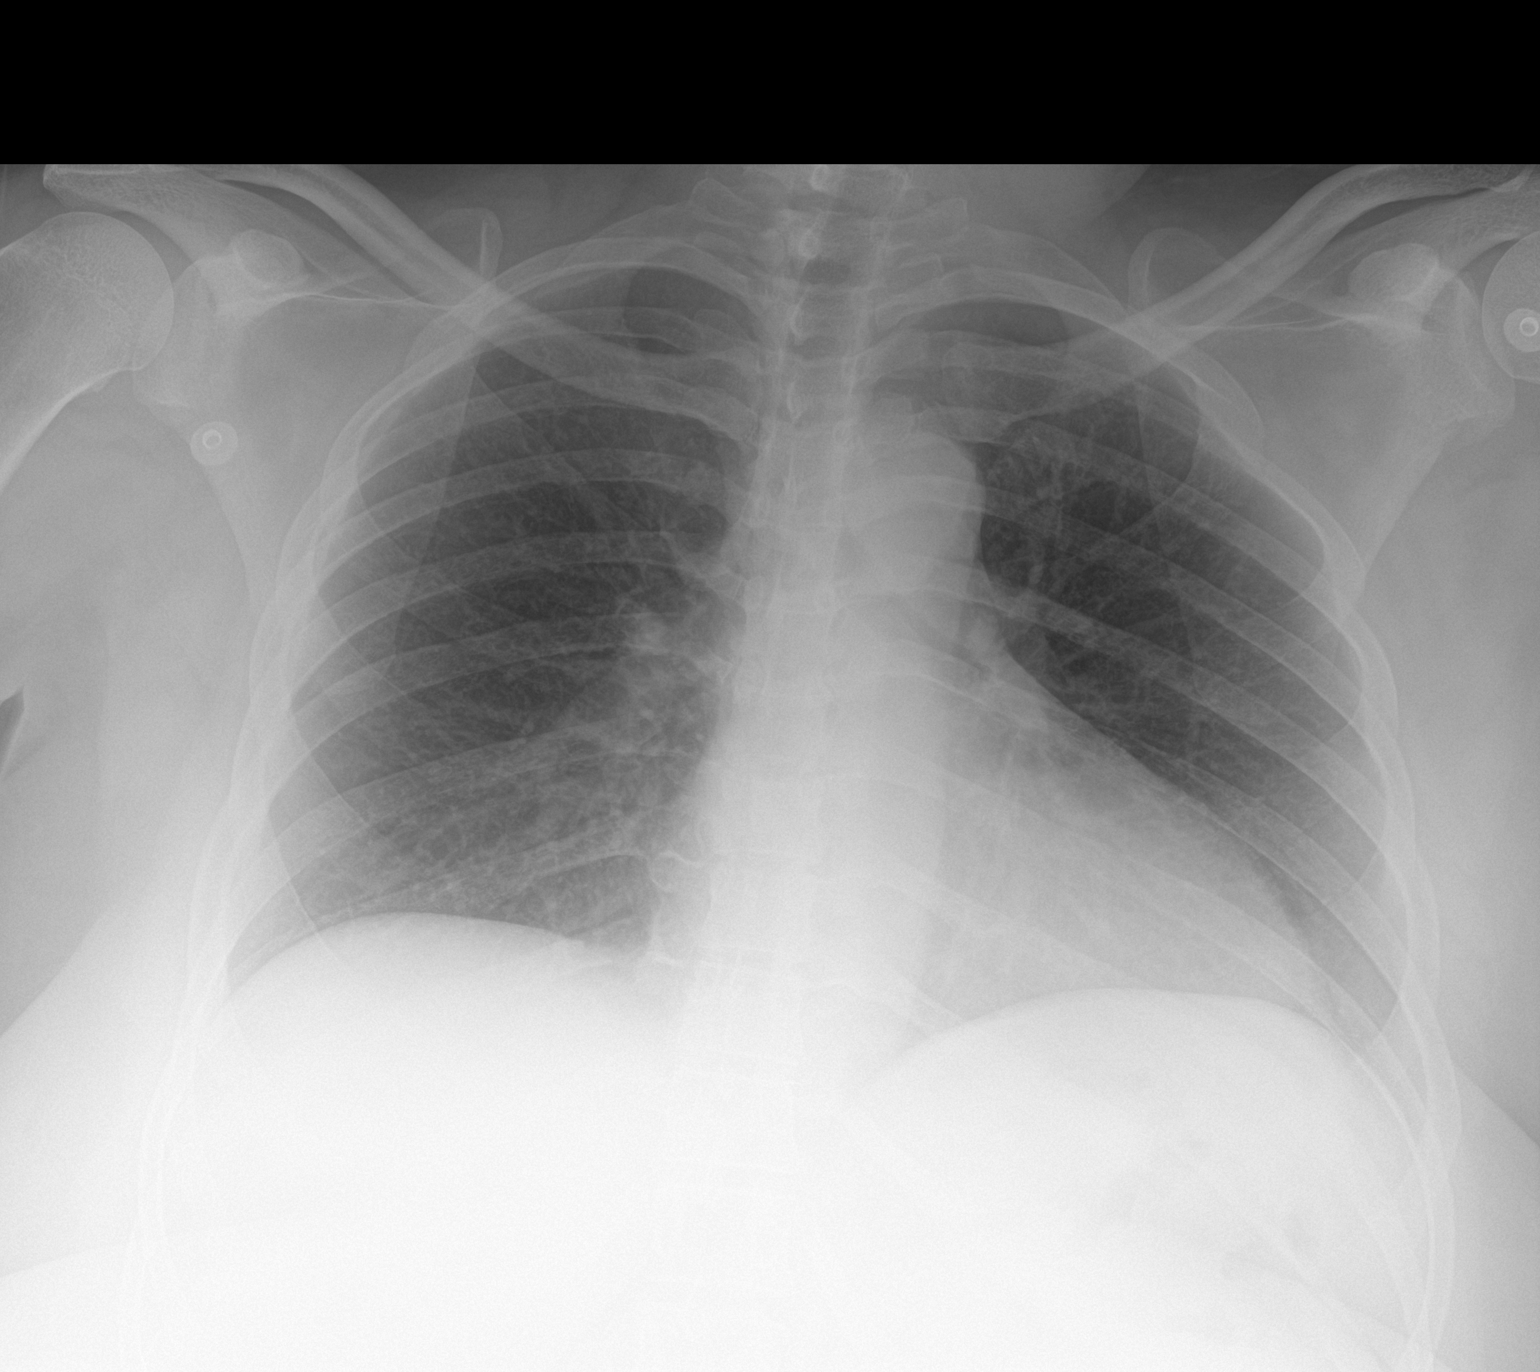

[1 of 1 positions shown; findings below may reference images not displayed]

FINDINGS: Normal heart size, mediastinal contours, and pulmonary vascularity.

Lungs clear.

No pleural effusion or pneumothorax.

Bones unremarkable.
IMPRESSION: Normal exam.

## 2021-12-02 ENCOUNTER — Ambulatory Visit: Payer: Medicaid Other | Admitting: Cardiology

## 2021-12-09 ENCOUNTER — Inpatient Hospital Stay: Admission: RE | Admit: 2021-12-09 | Payer: Medicaid Other | Source: Ambulatory Visit

## 2022-01-18 ENCOUNTER — Ambulatory Visit: Payer: Medicaid Other | Admitting: Cardiology

## 2022-01-18 ENCOUNTER — Encounter: Payer: Self-pay | Admitting: Cardiology

## 2022-01-18 VITALS — BP 123/86 | HR 90 | Resp 16 | Ht 63.0 in | Wt 250.0 lb

## 2022-01-18 DIAGNOSIS — E782 Mixed hyperlipidemia: Secondary | ICD-10-CM

## 2022-01-18 DIAGNOSIS — I2510 Atherosclerotic heart disease of native coronary artery without angina pectoris: Secondary | ICD-10-CM

## 2022-01-18 DIAGNOSIS — I493 Ventricular premature depolarization: Secondary | ICD-10-CM

## 2022-01-18 MED ORDER — ISOSORBIDE MONONITRATE ER 30 MG PO TB24: 30.0000 mg | ORAL_TABLET | Freq: Every day | ORAL | 3 refills | Status: DC

## 2022-01-18 MED ORDER — DILTIAZEM HCL ER COATED BEADS 240 MG PO CP24: 240.0000 mg | ORAL_CAPSULE | Freq: Every day | ORAL | 3 refills | Status: DC

## 2022-01-18 MED ORDER — TICAGRELOR 90 MG PO TABS: 90.0000 mg | ORAL_TABLET | Freq: Two times a day (BID) | ORAL | 3 refills | Status: DC

## 2022-01-18 MED ORDER — METOPROLOL TARTRATE 25 MG PO TABS: 25.0000 mg | ORAL_TABLET | Freq: Two times a day (BID) | ORAL | 3 refills | Status: DC

## 2022-01-18 NOTE — Progress Notes (Signed)
Follow up visit  Subjective:   [name], female    DOB: 08-08-1980, 41 y.o.   MRN: [id number]   HPI  Chief Complaint  Patient presents with   Coronary Artery Disease   Follow-up    3 month   Results    labs    41 y.o. African-American female with hypertension, obesity, prediabetes, mixed hyperlipidemia, elevated lipoprotein (a), CAD, s/p non-STEMI requiring PCI to proximal left circumflex in 04/2021.  She has had episodes of left sided pain radiating to left shoulder. Pain lasts for a couple min, not necessarily related to exertion. It appears that she has not been taking Brilinta since 09/2021. Stress test in 08/2021 was negative for ischemia.   Current Outpatient Medications:    aspirin 81 MG EC tablet, Take 1 tablet (81 mg total) by mouth daily. Swallow whole., Disp: 90 tablet, Rfl: 3   atorvastatin (LIPITOR) 80 MG tablet, Take 1 tablet (80 mg total) by mouth daily., Disp: 90 tablet, Rfl: 3   diclofenac Sodium (VOLTAREN) 1 % GEL, Apply 2 g topically daily as needed for pain., Disp: , Rfl:    diltiazem (CARDIZEM CD) 240 MG 24 hr capsule, TAKE 1 CAPSULE(240 MG) BY MOUTH DAILY (Patient taking differently: 240 mg daily.), Disp: 30 capsule, Rfl: 3   Evolocumab (REPATHA SURECLICK) 573 MG/ML SOAJ, Inject 1 mL into the skin every 14 (fourteen) days., Disp: 6 mL, Rfl: 3   folic acid (FOLVITE) 1 MG tablet, Take 1 mg by mouth daily., Disp: , Rfl:    linaclotide (LINZESS) 72 MCG capsule, Take 72 mcg by mouth daily before breakfast., Disp: , Rfl:    lisinopril (ZESTRIL) 2.5 MG tablet, Take 1 tablet (2.5 mg total) by mouth daily., Disp: 90 tablet, Rfl: 3   metoCLOPramide (REGLAN) 10 MG tablet, Take 10 mg by mouth every 8 (eight) hours as needed for nausea., Disp: , Rfl:    metoprolol tartrate (LOPRESSOR) 25 MG tablet, Take 1 tablet (25 mg total) by mouth 2 (two) times daily., Disp: 180 tablet, Rfl: 3   Multiple Vitamin (MULTIVITAMIN) capsule, Take 1 capsule by mouth daily., Disp: ,  Rfl:    nitroGLYCERIN (NITROSTAT) 0.4 MG SL tablet, Place 1 tablet (0.4 mg total) under the tongue every 5 (five) minutes as needed for chest pain., Disp: 30 tablet, Rfl: 1   nortriptyline (PAMELOR) 50 MG capsule, Take 50 mg by mouth at bedtime., Disp: , Rfl:    omeprazole (PRILOSEC) 40 MG capsule, Take 1 capsule by mouth daily., Disp: , Rfl:    sertraline (ZOLOFT) 100 MG tablet, Take 1 tablet (100 mg total) by mouth daily., Disp: 30 tablet, Rfl: 1   sertraline (ZOLOFT) 100 MG tablet, Take 1 tablet by mouth as needed., Disp: , Rfl:    ticagrelor (BRILINTA) 90 MG TABS tablet, Take 1 tablet (90 mg total) by mouth 2 (two) times daily., Disp: 180 tablet, Rfl: 3   tiZANidine (ZANAFLEX) 4 MG tablet, Take 4 mg by mouth every 8 (eight) hours as needed for muscle spasms., Disp: , Rfl:    Cardiovascular & other pertient studies:  Reviewed external labs and tests, independently interpreted  EKG 11/132023: Sinus rhythm 84 bpm Occasional PVC    Coronary intervention 04/22/2021: LM: Normal LAD: Normal Lcx: Large, dominant vessel        Prox 80% eccentric stenosis        Lateral branch off OM1 with ostial 70% stenosis        OM3 ostial 70% stenosis RCA:  Nondominant   LVEDP normal   Successful percutaneous coronary intervention prox LCx        PTCA and stent placement 3.0 X 18 mm Onyx Frontier drug-eluting stent        Post dilatation with 3.25X8 mm [place] balloon at 16 atm   Rest of the moderate residual disease best treated medically  Echocardiogram 04/22/2021: 1. Left ventricular ejection fraction, by estimation, is 55 to 60%. The  left ventricle has normal function. The left ventricle demonstrates  regional wall motion abnormalities (see scoring diagram/findings for  description). Left ventricular diastolic  parameters were normal. There is mild hypokinesis of the left ventricular,  basal inferolateral wall.   2. Right ventricular systolic function is low normal. The right  ventricular size  is not well visualized.   3. The mitral valve is normal in structure. No evidence of mitral valve  regurgitation. No evidence of mitral stenosis.   4. The aortic valve is normal in structure. Aortic valve regurgitation is  not visualized. No aortic stenosis is present.    Recent labs: 10/13/2021: Chol 126, TG 118, HDL 61, LDL 44  06/05/2021: Chol 191, TG 112, HDL 54, LDL 117  04/24/2021: Glucose 139, BUN/Cr 13/0.91. EGFR >60. Na/K 140/3.4. Rest of the CMP normal H/H 12/37. MCV 88. Platelets 335 HbA1C 6.0% Chol 239, TG 79, HDL 46, LDL 172 Lipoprotein (a) 296   Review of Systems  Cardiovascular:  Negative for chest pain, dyspnea on exertion, leg swelling, palpitations and syncope.         Vitals:   01/18/22 1141  BP: 123/86  Pulse: 90  Resp: 16  SpO2: 96%    Body mass index is 44.29 kg/m. Filed Weights   01/18/22 1141  Weight: 250 lb (113.4 kg)     Objective:   Physical Exam Vitals and nursing note reviewed.  Constitutional:      General: She is not in acute distress. Neck:     Vascular: No JVD.  Cardiovascular:     Rate and Rhythm: Normal rate and regular rhythm.     Heart sounds: Normal heart sounds. No murmur heard. Pulmonary:     Effort: Pulmonary effort is normal.     Breath sounds: Normal breath sounds. No wheezing or rales.  Musculoskeletal:     Right lower leg: No edema.     Left lower leg: No edema.             Visit diagnoses:   ICD-10-CM   1. Coronary artery disease involving native coronary artery of native heart without angina pectoris  I25.10 EKG 12-Lead    ticagrelor (BRILINTA) 90 MG TABS tablet    metoprolol tartrate (LOPRESSOR) 25 MG tablet    2. Mixed hyperlipidemia  E78.2     3. 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 African-American female with hypertension, obesity, prediabetes,  mixed hyperlipidemia, elevated lipoprotein (a), CAD, s/p non-STEMI requiring PCI to proximal left circumflex in 04/2021.  CAD: S/p PCI to Prox Lcx severe stenosis Rest moderate disease medically treated Recommend DAPT with Aspirin and Brilinta till 04/2022 LDL 172, lipoprotein (a) 296 (04/2021). LDL down to 44 on Lipitor 80 mg and Repatha. Given recent angina and not being on Brilinta, resume Brilinta. Added Imdur 30 mg daily. F/u in 4 weeks. If symptoms not improve,  will consider invasive evaluation.  F/u in 4 weeks    Nigel Mormon, MD Pager: [phone] Office: [phone]

## 2022-01-22 ENCOUNTER — Telehealth: Payer: Self-pay

## 2022-01-22 DIAGNOSIS — G4440 Drug-induced headache, not elsewhere classified, not intractable: Secondary | ICD-10-CM

## 2022-01-22 NOTE — Telephone Encounter (Signed)
ON-CALL CARDIOLOGY 01/22/22  Patient's name: [name].   MRN: [id number].    DOB: Jul 10, 1980 Primary care provider: Norm Salt, PA. Primary cardiologist: Perlie Mayo, MD, [facility]  Chief Complaint  Patient presents with   Headache    Interaction regarding this patient's care today: Received page from patient stating that since starting isosorbide she has had excruciating headache.  Her headache was so bad today that she had to leave work due to inability to complete her work task.  She has been taking nortriptyline however this has not helped with the headaches.  She is not currently experiencing any chest pain.  She denies any other symptoms at this time.  Impression:   ICD-10-CM   1. Drug-induced headache, not elsewhere classified, not intractable  G44.40       No orders of the defined types were placed in this encounter.   No orders of the defined types were placed in this encounter.   Recommendations: Advised patient that she may take Tylenol to assist with headaches caused by isosorbide.  If this does not help relieve headaches she may stop isosorbide.  Advised patient that she if she has recurrence of chest pain if she stops taking isosorbide that she may take sublingual nitroglycerin as prescribed.  Patient understands that current dose of isosorbide is an extended release medication and to not take sublingual nitroglycerin with isosorbide.  Advised patient that if headaches do not subside or if she has recurrence of chest pain that is unrelieved by sublingual nitroglycerin to seek emergency care or call back for headache.  Telephone encounter total time: 7 minutes    Nori Riis, [place] Pager: [phone] Office: [phone]

## 2022-02-15 ENCOUNTER — Ambulatory Visit: Payer: Self-pay | Admitting: Cardiology

## 2022-02-15 NOTE — Progress Notes (Signed)
No show

## 2022-02-24 ENCOUNTER — Emergency Department (HOSPITAL_COMMUNITY)
Admission: EM | Admit: 2022-02-24 | Discharge: 2022-02-24 | Disposition: A | Discharge status: Home / Self Care | Payer: Medicaid Other | Attending: Emergency Medicine | Admitting: Emergency Medicine

## 2022-02-24 ENCOUNTER — Encounter (HOSPITAL_COMMUNITY): Payer: Self-pay | Admitting: Emergency Medicine

## 2022-02-24 DIAGNOSIS — T300 Burn of unspecified body region, unspecified degree: Secondary | ICD-10-CM

## 2022-02-24 DIAGNOSIS — X100XXA Contact with hot drinks, initial encounter: Secondary | ICD-10-CM | POA: Insufficient documentation

## 2022-02-24 DIAGNOSIS — T2112XA Burn of first degree of abdominal wall, initial encounter: Secondary | ICD-10-CM | POA: Diagnosis present

## 2022-02-24 MED ORDER — MUPIROCIN 2 % EX OINT: TOPICAL_OINTMENT | Freq: Two times a day (BID) | CUTANEOUS | 0 refills | Status: DC
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
## 2022-02-24 NOTE — Discharge Instructions (Addendum)
Use the mupirocin cream twice daily over the affected area.  Can wash with cold water and soap.  Follow-up with your main doctor in a week for recheck.  Should resolve in 5 to 7 days.

## 2022-05-27 ENCOUNTER — Encounter (HOSPITAL_COMMUNITY): Payer: Self-pay

## 2022-05-27 ENCOUNTER — Emergency Department (HOSPITAL_COMMUNITY)
Admission: EM | Admit: 2022-05-27 | Discharge: 2022-05-28 | Disposition: A | Discharge status: Home / Self Care | Payer: Medicaid Other | Attending: Emergency Medicine | Admitting: Emergency Medicine

## 2022-05-27 ENCOUNTER — Emergency Department (HOSPITAL_COMMUNITY): Payer: Medicaid Other

## 2022-05-27 ENCOUNTER — Other Ambulatory Visit: Payer: Self-pay

## 2022-05-27 DIAGNOSIS — K2100 Gastro-esophageal reflux disease with esophagitis, without bleeding: Secondary | ICD-10-CM | POA: Insufficient documentation

## 2022-05-27 DIAGNOSIS — K219 Gastro-esophageal reflux disease without esophagitis: Secondary | ICD-10-CM

## 2022-05-27 DIAGNOSIS — R1013 Epigastric pain: Secondary | ICD-10-CM | POA: Diagnosis present

## 2022-05-27 DIAGNOSIS — R079 Chest pain, unspecified: Secondary | ICD-10-CM

## 2022-05-27 DIAGNOSIS — Z7982 Long term (current) use of aspirin: Secondary | ICD-10-CM | POA: Diagnosis not present

## 2022-05-27 LAB — HEPATIC FUNCTION PANEL
ALT: 23 U/L (ref 0–44)
AST: 28 U/L (ref 15–41)
Albumin: 4.1 g/dL (ref 3.5–5.0)
Alkaline Phosphatase: 71 U/L (ref 38–126)
Bilirubin, Direct: <0.1 mg/dL (ref 0.0–0.2)
Total Bilirubin: 0.5 mg/dL (ref 0.3–1.2)
Total Protein: 8.3 g/dL — ABNORMAL HIGH (ref 6.5–8.1)

## 2022-05-27 LAB — CBC
HCT: 39.7 % (ref 36.0–46.0)
Hemoglobin: 13.3 g/dL (ref 12.0–15.0)
MCH: 30.5 pg (ref 26.0–34.0)
MCHC: 33.5 g/dL (ref 30.0–36.0)
MCV: 91.1 fL (ref 80.0–100.0)
Platelets: 317 10*3/uL (ref 150–400)
RBC: 4.36 MIL/uL (ref 3.87–5.11)
RDW: 12.9 % (ref 11.5–15.5)
WBC: 7.7 10*3/uL (ref 4.0–10.5)
nRBC: 0 % (ref 0.0–0.2)

## 2022-05-27 LAB — BASIC METABOLIC PANEL
Anion gap: 18 — ABNORMAL HIGH (ref 5–15)
BUN: 15 mg/dL (ref 6–20)
CO2: 18 mmol/L — ABNORMAL LOW (ref 22–32)
Calcium: 9.8 mg/dL (ref 8.9–10.3)
Chloride: 101 mmol/L (ref 98–111)
Creatinine, Ser: 0.95 mg/dL (ref 0.44–1.00)
GFR, Estimated: >60 mL/min (ref >60)
Glucose, Bld: 161 mg/dL — ABNORMAL HIGH (ref 70–99)
Potassium: 3.4 mmol/L — ABNORMAL LOW (ref 3.5–5.1)
Sodium: 137 mmol/L (ref 135–145)

## 2022-05-27 LAB — I-STAT BETA HCG BLOOD, ED (MC, WL, AP ONLY): I-stat hCG, quantitative: <5 m[IU]/mL (ref <5)

## 2022-05-27 LAB — TROPONIN I (HIGH SENSITIVITY)
Troponin I (High Sensitivity): 5 ng/L (ref <18)
Troponin I (High Sensitivity): 7 ng/L (ref <18)

## 2022-05-27 LAB — LIPASE, BLOOD: Lipase: 30 U/L (ref 11–51)

## 2022-05-27 MED ORDER — LORAZEPAM 2 MG/ML IJ SOLN
1.0000 mg | Freq: Once | INTRAMUSCULAR | Status: AC
  Administered 2022-05-27: 1 mg via INTRAVENOUS
  Filled 2022-05-27: qty 1

## 2022-05-27 MED ORDER — METOCLOPRAMIDE HCL 5 MG/ML IJ SOLN
10.0000 mg | Freq: Once | INTRAMUSCULAR | Status: AC
  Administered 2022-05-27: 10 mg via INTRAVENOUS
  Filled 2022-05-27: qty 2

## 2022-05-27 MED ORDER — SODIUM CHLORIDE 0.9 % IV BOLUS
1000.0000 mL | Freq: Once | INTRAVENOUS | Status: AC
  Administered 2022-05-27: 1000 mL via INTRAVENOUS

## 2022-05-27 MED ORDER — DIPHENHYDRAMINE HCL 50 MG/ML IJ SOLN
12.5000 mg | Freq: Once | INTRAMUSCULAR | Status: AC
  Administered 2022-05-27: 12.5 mg via INTRAVENOUS
  Filled 2022-05-27: qty 1

## 2022-05-27 MED ORDER — ONDANSETRON HCL 4 MG/2ML IJ SOLN
4.0000 mg | Freq: Once | INTRAMUSCULAR | Status: AC
  Administered 2022-05-27: 4 mg via INTRAVENOUS
  Filled 2022-05-27: qty 2

## 2022-05-27 MED ORDER — FAMOTIDINE IN NACL 20-0.9 MG/50ML-% IV SOLN
20.0000 mg | Freq: Once | INTRAVENOUS | Status: AC
  Administered 2022-05-27: 20 mg via INTRAVENOUS
  Filled 2022-05-27: qty 50

## 2022-05-27 MED ORDER — ONDANSETRON 4 MG PO TBDP
8.0000 mg | ORAL_TABLET | Freq: Once | ORAL | Status: AC
  Administered 2022-05-27: 8 mg via ORAL
  Filled 2022-05-27: qty 2

## 2022-05-27 MED ORDER — IOHEXOL 350 MG/ML SOLN
75.0000 mL | Freq: Once | INTRAVENOUS | Status: AC | PRN
  Administered 2022-05-27: 75 mL via INTRAVENOUS

## 2022-05-27 NOTE — ED Provider Triage Note (Signed)
Emergency Medicine Provider Triage Evaluation Note  [name] , a 42 y.o. female  was evaluated in triage.  Pt complains of 2 days of nausea, vomiting, chest pain.  Patient reports initially having pain in the lower abdomen followed by vomiting.  Since that time her pain has been the epigastric region with accompanying left-sided chest pain.  Patient states she feels somewhat similar to her previous MI (NSTEMI)  Review of Systems  Positive: As above Negative: As above  Physical Exam  BP (!) 151/123 (BP Location: Left Arm)   Pulse (!) 112   Temp 98.5 F (36.9 C) (Oral)   Resp 20   SpO2 94%  Gen:   Awake, patient actively vomiting Resp:  Normal effort  MSK:   Moves extremities without difficulty  Other:    Medical Decision Making  Medically screening exam initiated at 3:43 PM.  Appropriate orders placed.  [name] was informed that the remainder of the evaluation will be completed by another provider, this initial triage assessment does not replace that evaluation, and the importance of remaining in the ED until their evaluation is complete.     Dorothyann Peng, PA-C 05/27/22 1545

## 2022-05-27 NOTE — ED Provider Notes (Signed)
[facility] Provider Note   CSN: [id number] Arrival date & time: 05/27/22  1527     History  Chief Complaint  Patient presents with   Nausea    [name] is a 42 y.o. female.  Patient complains of vomiting and epigastric abdominal pain.  Patient reports that she has reflux.  Patient complains of pain in her upper abdomen and in her chest.  Patient reports that she had a heart attack in February 2023.  Patient reports current symptoms feel like the discomfort she had when she had her heart attack but also feels like her reflux.  Patient reports she has had multiple episodes of reflux in the past associated with vomiting.  Patient reports pain in the abdomen is worse than usual.  The history is provided by the patient. No language interpreter was used.       Home Medications Prior to Admission medications   Medication Sig Start Date End Date Taking? Authorizing Provider  aspirin 81 MG EC tablet Take 1 tablet (81 mg total) by mouth daily. Swallow whole. 05/07/21   Patwardhan, Reynold Bowen, MD  atorvastatin (LIPITOR) 80 MG tablet Take 1 tablet (80 mg total) by mouth daily. 05/07/21   Patwardhan, Reynold Bowen, MD  diclofenac Sodium (VOLTAREN) 1 % GEL Apply 2 g topically daily as needed for pain.    [provider]  diltiazem (CARDIZEM CD) 240 MG 24 hr capsule Take 1 capsule (240 mg total) by mouth daily. 01/18/22   Patwardhan, Manish J, MD  Evolocumab (REPATHA SURECLICK) [id number] MG/ML SOAJ Inject 1 mL into the skin every 14 (fourteen) days. 10/13/21   Patwardhan, Reynold Bowen, MD  folic acid (FOLVITE) 1 MG tablet Take 1 mg by mouth daily.    [provider]  isosorbide mononitrate (IMDUR) 30 MG 24 hr tablet Take 1 tablet (30 mg total) by mouth daily. 01/18/22 01/13/23  Patwardhan, Reynold Bowen, MD  linaclotide (LINZESS) 72 MCG capsule Take 72 mcg by mouth daily before breakfast.    [provider]  lisinopril (ZESTRIL) 2.5 MG tablet  Take 1 tablet (2.5 mg total) by mouth daily. Patient not taking: Reported on 01/18/2022 05/07/21   Nigel Mormon, MD  metoCLOPramide (REGLAN) 10 MG tablet Take 10 mg by mouth every 8 (eight) hours as needed for nausea.    [provider]  metoprolol tartrate (LOPRESSOR) 25 MG tablet Take 1 tablet (25 mg total) by mouth 2 (two) times daily. 01/18/22   Patwardhan, Reynold Bowen, MD  Multiple Vitamin (MULTIVITAMIN) capsule Take 1 capsule by mouth daily.    [provider]  mupirocin ointment (BACTROBAN) 2 % Apply topically 2 (two) times daily. 02/24/22   Sherrill Raring, PA-C  nitroGLYCERIN (NITROSTAT) 0.4 MG SL tablet Place 1 tablet (0.4 mg total) under the tongue every 5 (five) minutes as needed for chest pain. 05/07/21 01/18/22  Patwardhan, Reynold Bowen, MD  nortriptyline (PAMELOR) 50 MG capsule Take 50 mg by mouth at bedtime. 03/17/21   [provider]  omeprazole (PRILOSEC) 40 MG capsule Take 1 tablet by mouth daily.    [provider]  sertraline (ZOLOFT) 100 MG tablet Take 1 tablet (100 mg total) by mouth daily. 04/19/14   Woodroe Mode, MD  sertraline (ZOLOFT) 100 MG tablet Take 1 tablet by mouth as needed.    [provider]  ticagrelor (BRILINTA) 90 MG TABS tablet Take 1 tablet (90 mg total) by mouth 2 (two) times daily. 01/18/22  Patwardhan, Manish J, MD  tiZANidine (ZANAFLEX) 4 MG tablet Take 4 mg by mouth every 8 (eight) hours as needed for muscle spasms.    [provider]  potassium chloride SA (KLOR-CON M) 20 MEQ tablet Take 1 tablet (20 mEq total) by mouth daily. 04/24/21 04/24/21  Rex Kras, DO      Allergies    Patient has no known allergies.    Review of Systems   Review of Systems  Gastrointestinal:  Positive for abdominal pain, nausea and vomiting.  All other systems reviewed and are negative.   Physical Exam Updated Vital Signs BP (!) 154/115   Pulse (!) 112   Temp 98 F (36.7 C) (Oral)   Resp (!) 23   Ht 5\' 3"  (1.6 m)    Wt 113.4 kg   LMP 05/27/2022 (Exact Date)   SpO2 99%   Breastfeeding No   BMI 44.29 kg/m  Physical Exam Vitals and nursing note reviewed.  Constitutional:      Appearance: She is well-developed.  HENT:     Head: Normocephalic.     Mouth/Throat:     Mouth: Mucous membranes are moist.  Eyes:     Pupils: Pupils are equal, round, and reactive to light.  Cardiovascular:     Rate and Rhythm: Normal rate.  Pulmonary:     Effort: Pulmonary effort is normal.  Abdominal:     General: There is no distension.  Musculoskeletal:        General: Normal range of motion.     Cervical back: Normal range of motion.  Skin:    General: Skin is warm.  Neurological:     General: No focal deficit present.     Mental Status: She is alert and oriented to person, place, and time.     ED Results / Procedures / Treatments   Labs (all labs ordered are listed, but only abnormal results are displayed) Labs Reviewed  BASIC METABOLIC PANEL - Abnormal; Notable for the following components:      Result Value   Potassium 3.4 (*)    CO2 18 (*)    Glucose, Bld 161 (*)    Anion gap 18 (*)    All other components within normal limits  HEPATIC FUNCTION PANEL - Abnormal; Notable for the following components:   Total Protein 8.3 (*)    All other components within normal limits  CBC  LIPASE, BLOOD  I-STAT BETA HCG BLOOD, ED ([facility], [facility], [facility] ONLY)  TROPONIN I (HIGH SENSITIVITY)  TROPONIN I (HIGH SENSITIVITY)    EKG EKG Interpretation  Date/Time:  Thursday May 27 2022 15:38:42 EDT Ventricular Rate:  111 PR Interval:  146 QRS Duration: 72 QT Interval:  352 QTC Calculation: 478 R Axis:   45 Text Interpretation: Sinus tachycardia with occasional Premature ventricular complexes Nonspecific T wave abnormality Abnormal ECG No significant change since last tracing Confirmed by Leanord Asal ([id number]) on 05/27/2022 6:17:58 PM  Radiology CT ABDOMEN PELVIS W CONTRAST  Result Date: 05/27/2022 CLINICAL  DATA:  Abdominal pain. EXAM: CT ABDOMEN AND PELVIS WITH CONTRAST TECHNIQUE: Multidetector CT imaging of the abdomen and pelvis was performed using the standard protocol following bolus administration of intravenous contrast. RADIATION DOSE REDUCTION: This exam was performed according to the departmental dose-optimization program which includes automated exposure control, adjustment of the mA and/or kV according to patient size and/or use of iterative reconstruction technique. CONTRAST:  52mL OMNIPAQUE IOHEXOL 350 MG/ML SOLN COMPARISON:  CT abdomen pelvis dated 04/11/2019. FINDINGS: Lower chest:  The visualized bases are clear. No intra-abdominal free air or free fluid. Hepatobiliary: Fatty liver. No biliary dilatation. The gallbladder is unremarkable Pancreas: Unremarkable. No pancreatic ductal dilatation or surrounding inflammatory changes. Spleen: Normal in size without focal abnormality. Adrenals/Urinary Tract: The adrenal glands are unremarkable. The kidneys, visualized ureters, and urinary bladder appear unremarkable. Stomach/Bowel: Mild thickened appearance of the colon likely related to underdistention. No pericolonic inflammatory changes. There is no bowel obstruction. The appendix is normal. Vascular/Lymphatic: The abdominal aorta and IVC unremarkable. No portal venous gas. There is no adenopathy. Reproductive: The uterus is anteverted and grossly unremarkable. No adnexal masses. Other: None Musculoskeletal: Bilateral sacroiliitis. Avascular necrosis of the left femoral head. No cortical collapse. No acute osseous pathology. IMPRESSION: 1. No acute intra-abdominal or pelvic pathology. 2. Fatty liver. 3. Avascular necrosis of the left femoral head. No cortical collapse. Electronically Signed   By: Anner Crete M.D.   On: 05/27/2022 21:55   DG Chest 2 View  Result Date: 05/27/2022 CLINICAL DATA:  Chest pain and vomiting x2 days. EXAM: CHEST - 2 VIEW COMPARISON:  April 22, 2021 FINDINGS: The heart  size and mediastinal contours are within normal limits. Both lungs are clear. The visualized skeletal structures are unremarkable. IMPRESSION: No active cardiopulmonary disease. Electronically Signed   By: Virgina Norfolk M.D.   On: 05/27/2022 16:21    Procedures Procedures    Medications Ordered in ED Medications  ondansetron (ZOFRAN-ODT) disintegrating tablet 8 mg (8 mg Oral Given 05/27/22 1546)  sodium chloride 0.9 % bolus 1,000 mL (0 mLs Intravenous Stopped 05/27/22 2106)  metoCLOPramide (REGLAN) injection 10 mg (10 mg Intravenous Given 05/27/22 1759)  diphenhydrAMINE (BENADRYL) injection 12.5 mg (12.5 mg Intravenous Given 05/27/22 1800)  ondansetron (ZOFRAN) injection 4 mg (4 mg Intravenous Given 05/27/22 2058)  famotidine (PEPCID) IVPB 20 mg premix (0 mg Intravenous Stopped 05/27/22 2148)  iohexol (OMNIPAQUE) 350 MG/ML injection 75 mL (75 mLs Intravenous Contrast Given 05/27/22 2148)    ED Course/ Medical Decision Making/ A&P                             Medical Decision Making Patient complains of epigastric abdominal pain and lower abdominal pain  Amount and/or Complexity of Data Reviewed External Data Reviewed: notes.    Details: Previous notes reviewed.  Patient has a history of reflux.  Audiology notes reviewed Labs: ordered. Decision-making details documented in ED Course.    Details: Labs ordered reviewed and interpreted.  Troponin is negative x 2 Radiology: ordered and independent interpretation performed. Decision-making details documented in ED Course.    Details: Chest x-ray shows no acute abnormality CT abdomen and pelvis no acute changes  Risk Prescription drug management. Risk Details: Patient given IV Zofran.  Patient had resolution of symptoms for approximately an hour.  Patient given Benadryl and Reglan.  On reevaluation patient is sleeping.  Patient had further vomiting after receiving Reglan and Benadryl.  Patient given a second dosage of Zofran which once again  gave her temporary relief but patient had additional vomiting. Ativan 1mg  IV.  Second liter of fluids ordered.  Pt's care turned over to [facility].            Final Clinical Impression(s) / ED Diagnoses Final diagnoses:  Gastroesophageal reflux disease with esophagitis without hemorrhage  Chest pain due to GERD    Rx / DC Orders ED Discharge Orders     None  Fransico Meadow, PA-C 05/27/22 [place], [place], DO 05/27/22 2344

## 2022-05-27 NOTE — ED Notes (Signed)
Pts BP IS 181/111, has vomited and is asking for medication to help. Pts assigned RN informed

## 2022-05-27 NOTE — ED Triage Notes (Addendum)
Pt report she has been vomiting for 2 days associated with epigastric and chest pain. Pt is vomiting during triage. She had a heart attack in February and had a stent placed.

## 2022-05-28 ENCOUNTER — Other Ambulatory Visit (HOSPITAL_COMMUNITY): Payer: Self-pay

## 2022-05-28 MED ORDER — ONDANSETRON HCL 4 MG/2ML IJ SOLN
4.0000 mg | Freq: Once | INTRAMUSCULAR | Status: AC
  Administered 2022-05-28: 4 mg via INTRAVENOUS
  Filled 2022-05-28: qty 2

## 2022-05-28 MED ORDER — PROMETHAZINE HCL 25 MG PO TABS: 25.0000 mg | ORAL_TABLET | Freq: Four times a day (QID) | ORAL | 0 refills | Status: AC | PRN

## 2022-05-28 MED ORDER — PROMETHAZINE HCL 25 MG PO TABS
25.0000 mg | ORAL_TABLET | Freq: Four times a day (QID) | ORAL | 0 refills | Status: DC | PRN
  Filled 2022-05-28: qty 30, 8d supply, fill #0

## 2022-05-28 MED ORDER — PANTOPRAZOLE SODIUM 40 MG IV SOLR
40.0000 mg | Freq: Once | INTRAVENOUS | Status: AC
  Administered 2022-05-28: 40 mg via INTRAVENOUS
  Filled 2022-05-28: qty 10

## 2022-05-28 NOTE — ED Provider Notes (Signed)
12:00 AM Care assumed from [place], [place] at shift change. Presenting for CP and epigastric pain with N/V. CXR and CT abdomen/pelvis have been negative for acute pathology. Labs reassuring with negative troponin x 2.   1:32 AM Patient reassessed. No vomiting since Ativan. Reports nausea improved. Given apple juice for PO trial.  1:57 AM Patient tolerating PO fluids. Comfortable with plan for discharge and outpatient antiemetics. Requesting additional dose prior to discharge as pharmacy is not open at this hour.   Vitals:   05/27/22 2158 05/28/22 0130 05/28/22 0145 05/28/22 0151  BP:   (!) 149/102   Pulse:  (!) 108 (!) 106 (!) 105  Resp:  12 (!) 30 14  Temp: 98 F (36.7 C)     TempSrc: Oral     SpO2:  97% 97% 99%  Weight:      Height:          Antonietta Breach, PA-C 05/28/22 0201    Orpah Greek, MD 05/29/22 [phone]

## 2022-05-28 NOTE — ED Notes (Signed)
Pt provided with AVS.  Education complete; all questions answered.  Pt leaving ED in stable condition at this time via wheelchair with all belongings.

## 2022-05-28 NOTE — ED Notes (Signed)
Pt tolerated apple juice with no vomiting at this time.

## 2022-08-12 ENCOUNTER — Ambulatory Visit: Payer: Medicaid Other | Admitting: Cardiology

## 2022-08-12 NOTE — Progress Notes (Signed)
Follow up visit  Subjective:   [name], female    DOB: Jul 12, 1980, 42 y.o.   MRN: [id number]   HPI  No chief complaint on file.   42 y.o. African-American female with hypertension, obesity, prediabetes, mixed hyperlipidemia, elevated lipoprotein (a), CAD, s/p non-STEMI requiring PCI to proximal left circumflex in 04/2021.  She has had episodes of left sided pain radiating to left shoulder. Pain lasts for a couple min, not necessarily related to exertion. It appears that she has not been taking Brilinta since 09/2021. Stress test in 08/2021 was negative for ischemia.   Current Outpatient Medications:    aspirin 81 MG EC tablet, Take 1 tablet (81 mg total) by mouth daily. Swallow whole., Disp: 90 tablet, Rfl: 3   atorvastatin (LIPITOR) 80 MG tablet, Take 1 tablet (80 mg total) by mouth daily., Disp: 90 tablet, Rfl: 3   diclofenac Sodium (VOLTAREN) 1 % GEL, Apply 2 g topically daily as needed for pain., Disp: , Rfl:    diltiazem (CARDIZEM CD) 240 MG 24 hr capsule, Take 1 capsule (240 mg total) by mouth daily., Disp: 90 capsule, Rfl: 3   Evolocumab (REPATHA SURECLICK) 140 MG/ML SOAJ, Inject 1 mL into the skin every 14 (fourteen) days., Disp: 6 mL, Rfl: 3   folic acid (FOLVITE) 1 MG tablet, Take 1 mg by mouth daily., Disp: , Rfl:    isosorbide mononitrate (IMDUR) 30 MG 24 hr tablet, Take 1 tablet (30 mg total) by mouth daily., Disp: 30 tablet, Rfl: 3   linaclotide (LINZESS) 72 MCG capsule, Take 72 mcg by mouth daily before breakfast., Disp: , Rfl:    lisinopril (ZESTRIL) 2.5 MG tablet, Take 1 tablet (2.5 mg total) by mouth daily. (Patient not taking: Reported on 01/18/2022), Disp: 90 tablet, Rfl: 3   metoCLOPramide (REGLAN) 10 MG tablet, Take 10 mg by mouth every 8 (eight) hours as needed for nausea., Disp: , Rfl:    metoprolol tartrate (LOPRESSOR) 25 MG tablet, Take 1 tablet (25 mg total) by mouth 2 (two) times daily., Disp: 180 tablet, Rfl: 3   Multiple Vitamin (MULTIVITAMIN)  capsule, Take 1 capsule by mouth daily., Disp: , Rfl:    mupirocin ointment (BACTROBAN) 2 %, Apply topically 2 (two) times daily., Disp: 22 g, Rfl: 0   nitroGLYCERIN (NITROSTAT) 0.4 MG SL tablet, Place 1 tablet (0.4 mg total) under the tongue every 5 (five) minutes as needed for chest pain., Disp: 30 tablet, Rfl: 1   nortriptyline (PAMELOR) 50 MG capsule, Take 50 mg by mouth at bedtime., Disp: , Rfl:    omeprazole (PRILOSEC) 40 MG capsule, Take 1 tablet by mouth daily., Disp: , Rfl:    promethazine (PHENERGAN) 25 MG tablet, Take 1 tablet (25 mg total) by mouth every 6 (six) hours as needed for nausea or vomiting., Disp: 30 tablet, Rfl: 0   sertraline (ZOLOFT) 100 MG tablet, Take 1 tablet (100 mg total) by mouth daily., Disp: 30 tablet, Rfl: 1   sertraline (ZOLOFT) 100 MG tablet, Take 1 tablet by mouth as needed., Disp: , Rfl:    ticagrelor (BRILINTA) 90 MG TABS tablet, Take 1 tablet (90 mg total) by mouth 2 (two) times daily., Disp: 180 tablet, Rfl: 3   tiZANidine (ZANAFLEX) 4 MG tablet, Take 4 mg by mouth every 8 (eight) hours as needed for muscle spasms., Disp: , Rfl:    Cardiovascular & other pertient studies:  Reviewed external labs and tests, independently interpreted  EKG 01/18/2022: Sinus rhythm 84 bpm Occasional PVC  Exercise Tetrofosmin stress test 08/31/2021: Exercise nuclear stress test was performed using Bruce protocol. Patient reached 7 METS, and 90% of age predicted maximum heart rate. Exercise capacity was low. No chest pain reported. Heart rate and hemodynamic response were normal. Stress EKG revealed no ischemic changes. SPECT Images show decreased tracer uptake in inferior myocardium, more prominent on rest images, likely due to breast tissue attenuation with imaging performed in the sitting position. No definite evidence of ischemia. Normal wall motion with stress LVEF 61%. Low risk study.  Coronary intervention 04/22/2021: LM: Normal LAD: Normal Lcx: Large, dominant  vessel        Prox 80% eccentric stenosis        Lateral branch off OM1 with ostial 70% stenosis        OM3 ostial 70% stenosis RCA: Nondominant   LVEDP normal   Successful percutaneous coronary intervention prox LCx        PTCA and stent placement 3.0 X 18 mm Onyx Frontier drug-eluting stent        Post dilatation with 3.25X8 mm Eagar balloon at 16 atm   Rest of the moderate residual disease best treated medically  Echocardiogram 04/22/2021: 1. Left ventricular ejection fraction, by estimation, is 55 to 60%. The  left ventricle has normal function. The left ventricle demonstrates  regional wall motion abnormalities (see scoring diagram/findings for  description). Left ventricular diastolic  parameters were normal. There is mild hypokinesis of the left ventricular,  basal inferolateral wall.   2. Right ventricular systolic function is low normal. The right  ventricular size is not well visualized.   3. The mitral valve is normal in structure. No evidence of mitral valve  regurgitation. No evidence of mitral stenosis.   4. The aortic valve is normal in structure. Aortic valve regurgitation is  not visualized. No aortic stenosis is present.    Recent labs: 05/27/2022: Glucose 161, BUN/Cr 15/0.95. EGFR >60. Na/K 137/3.4. Protein 8.3. Rest of the CMP normal H/H 13/39. MCV 91. Platelets 317  10/13/2021: Chol 126, TG 118, HDL 61, LDL 44  06/05/2021: Chol 191, TG 112, HDL 54, LDL 117  04/24/2021: Glucose 139, BUN/Cr 13/0.91. EGFR >60. Na/K 140/3.4. Rest of the CMP normal H/H 12/37. MCV 88. Platelets 335 HbA1C 6.0% Chol 239, TG 79, HDL 46, LDL 172 Lipoprotein (a) 296   Review of Systems  Cardiovascular:  Negative for chest pain, dyspnea on exertion, leg swelling, palpitations and syncope.         There were no vitals filed for this visit.   There is no height or weight on file to calculate BMI. There were no vitals filed for this visit.    Objective:   Physical  Exam Vitals and nursing note reviewed.  Constitutional:      General: She is not in acute distress. Neck:     Vascular: No JVD.  Cardiovascular:     Rate and Rhythm: Normal rate and regular rhythm.     Heart sounds: Normal heart sounds. No murmur heard. Pulmonary:     Effort: Pulmonary effort is normal.     Breath sounds: Normal breath sounds. No wheezing or rales.  Musculoskeletal:     Right lower leg: No edema.     Left lower leg: No edema.             Visit diagnoses: No diagnosis found.    No orders of the defined types were placed in this encounter.    Assessment & Recommendations:  42 y.o. African-American female with hypertension, obesity, prediabetes, mixed hyperlipidemia, elevated lipoprotein (a), CAD, s/p non-STEMI requiring PCI to proximal left circumflex in 04/2021.  CAD: S/p PCI to Prox Lcx severe stenosis Rest moderate disease medically treated. No significant ischemia on stress testing (08/2021). She had interrupted treatment with Brilinta, currently on aspirin 81 mg daily*** ***Recommend DAPT with Aspirin and Brilinta till 04/2022 LDL 172, lipoprotein (a) 296 (04/2021). LDL down to 44 on Lipitor 80 mg and Repatha. ***  F/u in ***    Elder Negus, MD Pager: [phone] Office: [phone]

## 2022-11-10 ENCOUNTER — Other Ambulatory Visit: Payer: Self-pay

## 2022-11-10 ENCOUNTER — Encounter (HOSPITAL_COMMUNITY): Payer: Self-pay | Admitting: Emergency Medicine

## 2022-11-10 ENCOUNTER — Emergency Department (HOSPITAL_COMMUNITY): Payer: Medicaid Other

## 2022-11-10 ENCOUNTER — Emergency Department (HOSPITAL_COMMUNITY)
Admission: EM | Admit: 2022-11-10 | Discharge: 2022-11-11 | Disposition: A | Discharge status: Home / Self Care | Payer: Medicaid Other | Attending: Emergency Medicine | Admitting: Emergency Medicine

## 2022-11-10 DIAGNOSIS — I10 Essential (primary) hypertension: Secondary | ICD-10-CM | POA: Insufficient documentation

## 2022-11-10 DIAGNOSIS — E876 Hypokalemia: Secondary | ICD-10-CM | POA: Insufficient documentation

## 2022-11-10 DIAGNOSIS — R072 Precordial pain: Secondary | ICD-10-CM | POA: Insufficient documentation

## 2022-11-10 DIAGNOSIS — R079 Chest pain, unspecified: Secondary | ICD-10-CM | POA: Diagnosis present

## 2022-11-10 DIAGNOSIS — Z7982 Long term (current) use of aspirin: Secondary | ICD-10-CM | POA: Insufficient documentation

## 2022-11-10 LAB — BASIC METABOLIC PANEL
Anion gap: 12 (ref 5–15)
BUN: 6 mg/dL (ref 6–20)
CO2: 21 mmol/L — ABNORMAL LOW (ref 22–32)
Calcium: 9.5 mg/dL (ref 8.9–10.3)
Chloride: 103 mmol/L (ref 98–111)
Creatinine, Ser: 0.85 mg/dL (ref 0.44–1.00)
GFR, Estimated: >60 mL/min (ref >60)
Glucose, Bld: 101 mg/dL — ABNORMAL HIGH (ref 70–99)
Potassium: 2.9 mmol/L — ABNORMAL LOW (ref 3.5–5.1)
Sodium: 136 mmol/L (ref 135–145)

## 2022-11-10 LAB — CBC
HCT: 34.9 % — ABNORMAL LOW (ref 36.0–46.0)
Hemoglobin: 11.4 g/dL — ABNORMAL LOW (ref 12.0–15.0)
MCH: 29.2 pg (ref 26.0–34.0)
MCHC: 32.7 g/dL (ref 30.0–36.0)
MCV: 89.3 fL (ref 80.0–100.0)
Platelets: 436 10*3/uL — ABNORMAL HIGH (ref 150–400)
RBC: 3.91 MIL/uL (ref 3.87–5.11)
RDW: 13.2 % (ref 11.5–15.5)
WBC: 9 10*3/uL (ref 4.0–10.5)
nRBC: 0 % (ref 0.0–0.2)

## 2022-11-10 LAB — TROPONIN I (HIGH SENSITIVITY): Troponin I (High Sensitivity): 3 ng/L (ref <18)

## 2022-11-10 MED ORDER — POTASSIUM CHLORIDE CRYS ER 20 MEQ PO TBCR
80.0000 meq | EXTENDED_RELEASE_TABLET | Freq: Once | ORAL | Status: AC
  Administered 2022-11-11: 80 meq via ORAL
  Filled 2022-11-10: qty 4

## 2022-11-10 NOTE — ED Triage Notes (Signed)
Patient with chest pain and dizziness that started earlier today unable to recall time states she is experiencing right now. Reports left sided chest pain radiating to back. Took two nitroglycerins at home last dose an hour ago with minimal relief. Reports SHOB. Denies n/v/d. Hx of afib and heart attack in 2023.

## 2022-11-11 ENCOUNTER — Emergency Department (HOSPITAL_COMMUNITY): Payer: Medicaid Other

## 2022-11-11 LAB — TROPONIN I (HIGH SENSITIVITY): Troponin I (High Sensitivity): 3 ng/L (ref <18)

## 2022-11-11 LAB — HCG, SERUM, QUALITATIVE: Preg, Serum: NEGATIVE

## 2022-11-11 MED ORDER — ALUM & MAG HYDROXIDE-SIMETH 200-200-20 MG/5ML PO SUSP
30.0000 mL | Freq: Once | ORAL | Status: AC
  Administered 2022-11-11: 30 mL via ORAL
  Filled 2022-11-11: qty 30

## 2022-11-11 MED ORDER — IOHEXOL 350 MG/ML SOLN
75.0000 mL | Freq: Once | INTRAVENOUS | Status: AC | PRN
  Administered 2022-11-11: 75 mL via INTRAVENOUS

## 2022-11-11 MED ORDER — POTASSIUM CHLORIDE CRYS ER 20 MEQ PO TBCR: 20.0000 meq | EXTENDED_RELEASE_TABLET | Freq: Two times a day (BID) | ORAL | 0 refills | Status: DC

## 2022-11-11 NOTE — ED Provider Notes (Signed)
[place] EMERGENCY DEPARTMENT AT [facility] Provider Note   CSN: [id number] Arrival date & time: 11/10/22  2147     History  Chief Complaint  Patient presents with   Chest Pain   Dizziness    [name] is a 42 y.o. female.  The history is provided by the patient.  Chest Pain Pain quality: dull   Pain radiates to:  Upper back Pain severity:  Moderate Onset quality:  Sudden Duration:  10 hours Timing:  Constant Progression:  Unchanged Chronicity:  New Context: at rest   Context comment:  Under stress, which started just just before the pain started Relieved by:  Nothing Worsened by:  Nothing Ineffective treatments:  None tried Associated symptoms: no altered mental status, no anorexia, no fever, no headache, no lower extremity edema, no nausea, no shortness of breath and no vomiting   Associated symptoms comment:  Lightheadedness  Risk factors: not female and no surgery   Patient with GERD with SSCP at rest today after stress.  No exertional component.  No SOB.  No n/v/d.     Past Medical History:  Diagnosis Date   Acid reflux    Alcohol abuse    Anemia    Depression    GERD (gastroesophageal reflux disease)    H/O suicide attempt    Hypertension    Vaginal Pap smear, abnormal      Home Medications Prior to Admission medications   Medication Sig Start Date End Date Taking? Authorizing Provider  aspirin 81 MG EC tablet Take 1 tablet (81 mg total) by mouth daily. Swallow whole. 05/07/21   Patwardhan, Anabel Bene, MD  atorvastatin (LIPITOR) 80 MG tablet Take 1 tablet (80 mg total) by mouth daily. 05/07/21   Patwardhan, Anabel Bene, MD  diclofenac Sodium (VOLTAREN) 1 % GEL Apply 2 g topically daily as needed for pain.    [provider]  diltiazem (CARDIZEM CD) 240 MG 24 hr capsule Take 1 capsule (240 mg total) by mouth daily. 01/18/22   Patwardhan, Manish J, MD  Evolocumab (REPATHA SURECLICK) 140 MG/ML SOAJ Inject 1 mL into the skin every 14  (fourteen) days. 10/13/21   Patwardhan, Anabel Bene, MD  folic acid (FOLVITE) 1 MG tablet Take 1 mg by mouth daily.    [provider]  isosorbide mononitrate (IMDUR) 30 MG 24 hr tablet Take 1 tablet (30 mg total) by mouth daily. 01/18/22 01/13/23  Patwardhan, Anabel Bene, MD  linaclotide (LINZESS) 72 MCG capsule Take 72 mcg by mouth daily before breakfast.    [provider]  lisinopril (ZESTRIL) 2.5 MG tablet Take 1 tablet (2.5 mg total) by mouth daily. Patient not taking: Reported on 01/18/2022 05/07/21   Elder Negus, MD  metoCLOPramide (REGLAN) 10 MG tablet Take 10 mg by mouth every 8 (eight) hours as needed for nausea.    [provider]  metoprolol tartrate (LOPRESSOR) 25 MG tablet Take 1 tablet (25 mg total) by mouth 2 (two) times daily. 01/18/22   Patwardhan, Anabel Bene, MD  Multiple Vitamin (MULTIVITAMIN) capsule Take 1 capsule by mouth daily.    [provider]  mupirocin ointment (BACTROBAN) 2 % Apply topically 2 (two) times daily. 02/24/22   Theron Arista, PA-C  nitroGLYCERIN (NITROSTAT) 0.4 MG SL tablet Place 1 tablet (0.4 mg total) under the tongue every 5 (five) minutes as needed for chest pain. 05/07/21 01/18/22  Patwardhan, Anabel Bene, MD  nortriptyline (PAMELOR) 50 MG capsule Take 50 mg by mouth at  bedtime. 03/17/21   [provider]  omeprazole (PRILOSEC) 40 MG capsule Take 1 tablet by mouth daily.    [provider]  promethazine (PHENERGAN) 25 MG tablet Take 1 tablet (25 mg total) by mouth every 6 (six) hours as needed for nausea or vomiting. 05/28/22   Antony Madura, PA-C  sertraline (ZOLOFT) 100 MG tablet Take 1 tablet (100 mg total) by mouth daily. 04/19/14   Adam Phenix, MD  sertraline (ZOLOFT) 100 MG tablet Take 1 tablet by mouth as needed.    [provider]  ticagrelor (BRILINTA) 90 MG TABS tablet Take 1 tablet (90 mg total) by mouth 2 (two) times daily. 01/18/22   Patwardhan, Anabel Bene, MD  tiZANidine (ZANAFLEX) 4 MG  tablet Take 4 mg by mouth every 8 (eight) hours as needed for muscle spasms.    [provider]  potassium chloride SA (KLOR-CON M) 20 MEQ tablet Take 1 tablet (20 mEq total) by mouth daily. 04/24/21 04/24/21  Tessa Lerner, DO      Allergies    Patient has no known allergies.    Review of Systems   Review of Systems  Constitutional:  Negative for fever.  HENT:  Negative for facial swelling.   Eyes:  Negative for redness.  Respiratory:  Negative for shortness of breath and stridor.   Cardiovascular:  Positive for chest pain.  Gastrointestinal:  Negative for anorexia, nausea and vomiting.  Neurological:  Positive for light-headedness. Negative for facial asymmetry and headaches.  All other systems reviewed and are negative.   Physical Exam Updated Vital Signs BP 105/72   Pulse 75   Temp 98.5 F (36.9 C)   Resp 20   Ht 5\' 3"  (1.6 m)   Wt 113 kg   SpO2 100%   BMI 44.13 kg/m  Physical Exam Vitals and nursing note reviewed.  Constitutional:      General: She is not in acute distress.    Appearance: Normal appearance. She is well-developed.  HENT:     Head: Normocephalic and atraumatic.     Nose: Nose normal.  Eyes:     Pupils: Pupils are equal, round, and reactive to light.  Cardiovascular:     Rate and Rhythm: Normal rate and regular rhythm.     Pulses: Normal pulses.     Heart sounds: Normal heart sounds.  Pulmonary:     Effort: Pulmonary effort is normal. No respiratory distress.     Breath sounds: Normal breath sounds.  Abdominal:     General: Bowel sounds are normal. There is no distension.     Palpations: Abdomen is soft.     Tenderness: There is no abdominal tenderness. There is no guarding or rebound.  Musculoskeletal:        General: Normal range of motion.     Cervical back: Neck supple.     Right lower leg: No edema.     Left lower leg: No edema.  Skin:    General: Skin is warm and dry.     Capillary Refill: Capillary refill takes less than 2  seconds.     Findings: No erythema or rash.  Neurological:     General: No focal deficit present.     Mental Status: She is alert and oriented to person, place, and time.     Deep Tendon Reflexes: Reflexes normal.  Psychiatric:        Mood and Affect: Mood normal.     ED Results / Procedures / Treatments  Labs (all labs ordered are listed, but only abnormal results are displayed) Results for orders placed or performed during the hospital encounter of 11/10/22  Basic metabolic panel  Result Value Ref Range   Sodium 136 135 - 145 mmol/L   Potassium 2.9 (L) 3.5 - 5.1 mmol/L   Chloride 103 98 - 111 mmol/L   CO2 21 (L) 22 - 32 mmol/L   Glucose, Bld 101 (H) 70 - 99 mg/dL   BUN 6 6 - 20 mg/dL   Creatinine, Ser 0.98 0.44 - 1.00 mg/dL   Calcium 9.5 8.9 - 11.9 mg/dL   GFR, Estimated >14 >78 mL/min   Anion gap 12 5 - 15  CBC  Result Value Ref Range   WBC 9.0 4.0 - 10.5 K/uL   RBC 3.91 3.87 - 5.11 MIL/uL   Hemoglobin 11.4 (L) 12.0 - 15.0 g/dL   HCT 29.5 (L) 62.1 - 30.8 %   MCV 89.3 80.0 - 100.0 fL   MCH 29.2 26.0 - 34.0 pg   MCHC 32.7 30.0 - 36.0 g/dL   RDW 65.7 84.6 - 96.2 %   Platelets 436 (H) 150 - 400 K/uL   nRBC 0.0 0.0 - 0.2 %  hCG, serum, qualitative  Result Value Ref Range   Preg, Serum NEGATIVE NEGATIVE  Troponin I (High Sensitivity)  Result Value Ref Range   Troponin I (High Sensitivity) 3 <18 ng/L  Troponin I (High Sensitivity)  Result Value Ref Range   Troponin I (High Sensitivity) 3 <18 ng/L   CT Angio Chest PE W and/or Wo Contrast  Result Date: 11/11/2022 CLINICAL DATA:  Syncope/presyncope, cerebrovascular cause suspected. Chest pain, dizziness EXAM: CT ANGIOGRAPHY CHEST WITH CONTRAST TECHNIQUE: Multidetector CT imaging of the chest was performed using the standard protocol during bolus administration of intravenous contrast. Multiplanar CT image reconstructions and MIPs were obtained to evaluate the vascular anatomy. RADIATION DOSE REDUCTION: This exam was  performed according to the departmental dose-optimization program which includes automated exposure control, adjustment of the mA and/or kV according to patient size and/or use of iterative reconstruction technique. CONTRAST:  75mL OMNIPAQUE IOHEXOL 350 MG/ML SOLN COMPARISON:  04/22/2021 FINDINGS: Cardiovascular: No filling defects in the pulmonary arteries to suggest pulmonary emboli. Heart is normal size. Aorta is normal caliber. Calcifications in the left anterior and left circumflex coronary arteries. Mediastinum/Nodes: No mediastinal, hilar, or axillary adenopathy. Trachea and esophagus are unremarkable. Thyroid unremarkable. Lungs/Pleura: Lungs are clear. No focal airspace opacities or suspicious nodules. No effusions. Upper Abdomen: No acute findings Musculoskeletal: Chest wall soft tissues are unremarkable. No acute bony abnormality. Review of the MIP images confirms the above findings. IMPRESSION: No evidence of pulmonary embolus. Age advanced coronary artery calcifications. No acute cardiopulmonary disease. Electronically Signed   By: Charlett Nose M.D.   On: 11/11/2022 01:59   DG Chest 2 View  Result Date: 11/10/2022 CLINICAL DATA:  Chest pain and dizziness. EXAM: CHEST - 2 VIEW COMPARISON:  05/27/2022 FINDINGS: The cardiomediastinal contours are normal. Coronary stent. The lungs are clear. Pulmonary vasculature is normal. No consolidation, pleural effusion, or pneumothorax. No acute osseous abnormalities are seen. IMPRESSION: No active cardiopulmonary disease. Electronically Signed   By: Narda Rutherford M.D.   On: 11/10/2022 22:18     EKG EKG Interpretation Date/Time:  Wednesday November 10 2022 21:41:25 EDT Ventricular Rate:  104 PR Interval:  132 QRS Duration:  78 QT Interval:  352 QTC Calculation: 462 R Axis:   19  Text Interpretation: Sinus tachycardia Confirmed by Nicanor Alcon, Kreg Earhart (  [id number]) on 11/10/2022 11:33:27 PM  Radiology CT Angio Chest PE W and/or Wo Contrast  Result Date:  11/11/2022 CLINICAL DATA:  Syncope/presyncope, cerebrovascular cause suspected. Chest pain, dizziness EXAM: CT ANGIOGRAPHY CHEST WITH CONTRAST TECHNIQUE: Multidetector CT imaging of the chest was performed using the standard protocol during bolus administration of intravenous contrast. Multiplanar CT image reconstructions and MIPs were obtained to evaluate the vascular anatomy. RADIATION DOSE REDUCTION: This exam was performed according to the departmental dose-optimization program which includes automated exposure control, adjustment of the mA and/or kV according to patient size and/or use of iterative reconstruction technique. CONTRAST:  75mL OMNIPAQUE IOHEXOL 350 MG/ML SOLN COMPARISON:  04/22/2021 FINDINGS: Cardiovascular: No filling defects in the pulmonary arteries to suggest pulmonary emboli. Heart is normal size. Aorta is normal caliber. Calcifications in the left anterior and left circumflex coronary arteries. Mediastinum/Nodes: No mediastinal, hilar, or axillary adenopathy. Trachea and esophagus are unremarkable. Thyroid unremarkable. Lungs/Pleura: Lungs are clear. No focal airspace opacities or suspicious nodules. No effusions. Upper Abdomen: No acute findings Musculoskeletal: Chest wall soft tissues are unremarkable. No acute bony abnormality. Review of the MIP images confirms the above findings. IMPRESSION: No evidence of pulmonary embolus. Age advanced coronary artery calcifications. No acute cardiopulmonary disease. Electronically Signed   By: Charlett Nose M.D.   On: 11/11/2022 01:59   DG Chest 2 View  Result Date: 11/10/2022 CLINICAL DATA:  Chest pain and dizziness. EXAM: CHEST - 2 VIEW COMPARISON:  05/27/2022 FINDINGS: The cardiomediastinal contours are normal. Coronary stent. The lungs are clear. Pulmonary vasculature is normal. No consolidation, pleural effusion, or pneumothorax. No acute osseous abnormalities are seen. IMPRESSION: No active cardiopulmonary disease. Electronically Signed   By:  Narda Rutherford M.D.   On: 11/10/2022 22:18    Procedures Procedures    Medications Ordered in ED Medications  alum & mag hydroxide-simeth (MAALOX/MYLANTA) 200-200-20 MG/5ML suspension 30 mL (has no administration in time range)  potassium chloride SA (KLOR-CON M) CR tablet 80 mEq (80 mEq Oral Given 11/11/22 0035)  iohexol (OMNIPAQUE) 350 MG/ML injection 75 mL (75 mLs Intravenous Contrast Given 11/11/22 0153)    ED Course/ Medical Decision Making/ A&P                                 Medical Decision Making Amount and/or Complexity of Data Reviewed Labs: ordered.    Details: Normal troponins 3/3.  Normal sodium 136, hypokalemia 2.9, normal creatinine 0.85.  Normal white count 9, hemoglobin 11.4, elevated platelets 436.   Radiology: ordered and independent interpretation performed.    Details: No PE by me on CTA ECG/medicine tests: ordered and independent interpretation performed. Decision-making details documented in ED Course.  Risk OTC drugs. Prescription drug management. Risk Details: Ruled out for MI and PE in the ED.  Likely secondary to stress.  Stable for discharge with close follow up.  Strict return    Final Clinical Impression(s) / ED Diagnoses  Return for intractable cough, coughing up blood, fevers > 100.4 unrelieved by medication, shortness of breath, intractable vomiting, chest pain, shortness of breath, weakness, numbness, changes in speech, facial asymmetry, abdominal pain, passing out, Inability to tolerate liquids or food, cough, altered mental status or any concerns. No signs of systemic illness or infection. The patient is nontoxic-appearing on exam and vital signs are within normal limits.  I have reviewed the triage vital signs and the nursing notes. Pertinent labs & imaging results that were available during  my care of the patient were reviewed by me and considered in my medical decision making (see chart for details). After history, exam, and medical workup I  feel the patient has been appropriately medically screened and is safe for discharge home. Pertinent diagnoses were discussed with the patient. Patient was given return precautions.      Alcus Bradly, MD 11/11/22 1610

## 2022-11-25 ENCOUNTER — Emergency Department (HOSPITAL_COMMUNITY)
Admission: EM | Admit: 2022-11-25 | Discharge: 2022-11-27 | Disposition: A | Discharge status: Other Acute Inpatient Hospital | Payer: Medicaid Other | Attending: Emergency Medicine | Admitting: Emergency Medicine

## 2022-11-25 ENCOUNTER — Ambulatory Visit (HOSPITAL_COMMUNITY)
Admission: EM | Admit: 2022-11-25 | Discharge: 2022-11-25 | Disposition: A | Discharge status: Home / Self Care | Payer: Medicaid Other | Attending: Psychiatry | Admitting: Psychiatry

## 2022-11-25 DIAGNOSIS — F22 Delusional disorders: Secondary | ICD-10-CM | POA: Insufficient documentation

## 2022-11-25 DIAGNOSIS — Z5900 Homelessness unspecified: Secondary | ICD-10-CM | POA: Insufficient documentation

## 2022-11-25 DIAGNOSIS — Z008 Encounter for other general examination: Secondary | ICD-10-CM

## 2022-11-25 DIAGNOSIS — F333 Major depressive disorder, recurrent, severe with psychotic symptoms: Secondary | ICD-10-CM | POA: Diagnosis not present

## 2022-11-25 DIAGNOSIS — Z7982 Long term (current) use of aspirin: Secondary | ICD-10-CM | POA: Diagnosis not present

## 2022-11-25 DIAGNOSIS — R441 Visual hallucinations: Secondary | ICD-10-CM

## 2022-11-25 DIAGNOSIS — R443 Hallucinations, unspecified: Secondary | ICD-10-CM | POA: Diagnosis present

## 2022-11-25 LAB — COMPREHENSIVE METABOLIC PANEL
ALT: 20 U/L (ref 0–44)
AST: 17 U/L (ref 15–41)
Albumin: 3.9 g/dL (ref 3.5–5.0)
Alkaline Phosphatase: 68 U/L (ref 38–126)
Anion gap: 11 (ref 5–15)
BUN: 10 mg/dL (ref 6–20)
CO2: 22 mmol/L (ref 22–32)
Calcium: 9.3 mg/dL (ref 8.9–10.3)
Chloride: 98 mmol/L (ref 98–111)
Creatinine, Ser: 0.9 mg/dL (ref 0.44–1.00)
GFR, Estimated: >60 mL/min (ref >60)
Glucose, Bld: 96 mg/dL (ref 70–99)
Potassium: 3.5 mmol/L (ref 3.5–5.1)
Sodium: 131 mmol/L — ABNORMAL LOW (ref 135–145)
Total Bilirubin: 0.7 mg/dL (ref 0.3–1.2)
Total Protein: 7.6 g/dL (ref 6.5–8.1)

## 2022-11-25 LAB — CBC
HCT: 37.6 % (ref 36.0–46.0)
Hemoglobin: 12.1 g/dL (ref 12.0–15.0)
MCH: 29.5 pg (ref 26.0–34.0)
MCHC: 32.2 g/dL (ref 30.0–36.0)
MCV: 91.7 fL (ref 80.0–100.0)
Platelets: 280 10*3/uL (ref 150–400)
RBC: 4.1 MIL/uL (ref 3.87–5.11)
RDW: 13.3 % (ref 11.5–15.5)
WBC: 6.7 10*3/uL (ref 4.0–10.5)
nRBC: 0 % (ref 0.0–0.2)

## 2022-11-25 LAB — ETHANOL: Alcohol, Ethyl (B): <10 mg/dL (ref <10)

## 2022-11-25 LAB — SALICYLATE LEVEL: Salicylate Lvl: <7 mg/dL — ABNORMAL LOW (ref 7.0–30.0)

## 2022-11-25 LAB — HCG, SERUM, QUALITATIVE: Preg, Serum: NEGATIVE

## 2022-11-25 LAB — ACETAMINOPHEN LEVEL: Acetaminophen (Tylenol), Serum: <10 ug/mL — ABNORMAL LOW (ref 10–30)

## 2022-11-25 MED ORDER — ZIPRASIDONE MESYLATE 20 MG IM SOLR
20.0000 mg | INTRAMUSCULAR | Status: AC | PRN
  Administered 2022-11-26: 20 mg via INTRAMUSCULAR
  Filled 2022-11-25: qty 20

## 2022-11-25 MED ORDER — RISPERIDONE 0.5 MG PO TBDP
1.0000 mg | ORAL_TABLET | Freq: Every day | ORAL | Status: DC
  Filled 2022-11-25: qty 2

## 2022-11-25 MED ORDER — LORAZEPAM 1 MG PO TABS: 1.0000 mg | ORAL_TABLET | ORAL | Status: DC | PRN

## 2022-11-25 MED ORDER — OLANZAPINE 5 MG PO TBDP
5.0000 mg | ORAL_TABLET | Freq: Three times a day (TID) | ORAL | Status: DC | PRN
  Administered 2022-11-27: 5 mg via ORAL
  Filled 2022-11-25: qty 1

## 2022-11-25 MED ORDER — HYDROCORTISONE 0.5 % EX CREA
TOPICAL_CREAM | Freq: Three times a day (TID) | CUTANEOUS | Status: DC
  Filled 2022-11-25: qty 28.35

## 2022-11-25 NOTE — Consult Note (Addendum)
BH ED ASSESSMENT   Reason for Consult:  delusions, paranoia  Referring Physician: Dr. Lynelle Doctor Patient Identification: [name] MRN:  [id number] ED Chief Complaint: MDD (major depressive disorder), recurrent, severe, with psychosis (HCC)  Diagnosis:  Principal Problem:   MDD (major depressive disorder), recurrent, severe, with psychosis (HCC) Active Problems:   Paranoia [facility])   ED Assessment Time Calculation: Start Time: 1300 Stop Time: 1340 Total Time in Minutes (Assessment Completion): 40   Subjective: [name] is a 42 y.o. female patient here from home brought in by [facility] reporting being seen at [facility] last night. Paranoia, delusions, and hallucinations. Thinks people are in home to harm her and states that there are multiple people outside home with guns. Reports ongoing stress.     HPI: [name], 42 y.o., female patient seen face to face by this provider, consulted with Dr. Lucianne Muss; and chart reviewed on 11/25/22.  On evaluation [name] reports that she is going through some stressful situations, and she states she has not been able to sleep.  Patient does not elaborate and is speaking very tangential.  States that she is afraid of going home and being alone, would not elaborate.  She states she lives with her 2 children in her home.  Patient appears to be thinking about her answers before she says all of as if she may be responding to internal stimuli.  Patient currently denies SI/HI/AVH, denies using any drugs or alcohol.  UDS is needed, BAL is less than 10.  Provider discussed with her about going to behavioral health urgent care yesterday, and asked her what happened, patient stated she was just having some depression, which she thinks is triggered by stress. Patient reports she is not on any psychiatric medications at this time however she [facility].  Patient states that she is not diagnosed with depression anymore, only  anxiety, states she does not take any medications says that she used to take Latuda years ago, but feels that she does not need any medications at this time.  Patient states that she sleeps about 3 hours a night, due to stress, says that she snacks when she is eating, does not fully eat meals.  Patient says that she was admitted to [facility], 3 weeks ago which is located in [facility], feels that they did not help her, states she does not feel like she needs to be admitted to any inpatient facility.  Patient is focused on going back home.Patient reports she has a mother who lives in [place], but her mobility is limited and she didn't want to go to her mom's house.  She states that her mother is not supportive, but gives this provider permission to speak with her mother.  On evaluation, patient is alert, oriented x 3, and cooperative. Speech is clear, normal rate and patient appears to be speaking in tangential phrases, appears to be guarded with information, does not want to be admitted to an inpatient facility. Pt appears casually dressed. Eye contact is fair. Mood is anxious, affect is congruent with mood. Pt denies SI/HI/AVH.  Patient appears to be thinking about her answers, unsure if she is responding to internal stimuli or if she is just trying to say what she thinks is right so that she does not have to be admitted to an inpatient facility. Per chart review patient was seen at behavioral health urgent care on yesterday with similar symptoms of paranoia, and stress,  and was psychiatrically cleared.  Patient called the police today because of concerns about people with guns, police did not see anyone so patient was sent here to the emergency department for psychiatric evaluation.   [facility] spoke with patient mother [name]) for collateral. Pts mother said that pt had been diagnosed as bipolar but has not been taking meds for the last three years. Pts mother reports that pt experiences both  visual and auditory hallucinations with voices telling her that they are going to kill her and her family. Pt has tried to kill herself 2 times many years ago after both of her younger children were born.   As per pts mother last night pt had been driving around in her car with her children for hours which caused the police to intervene and bring the children to stay with her mother. Mother reports that patient has increasingly become paranoid believing that people are trying to kill her and her children. Pts mother said that this is the "worst she has ever seen" her daughter.   Mother is very happy that her daughter in in the hospital getting the help that she needs. Mother needs to get pts keys to her house so that she can pick up items for pts children.   Past Psychiatric History: Paranoia, psychosis  Risk to Self or Others: Risk to Self: Yes, currently at risk to self only do to inability to sleep and active psychosis (delusional thinking) however the patient is not endorsing any self-harm, suicidal ideation and has not had any active suicidality.  Risk to Others:  No  Prior Inpatient Therapy:  Yes  Prior Outpatient Therapy: Yes [facility]   [place] Scale:  Flowsheet Row ED from 11/25/2022 in [facility] Emergency Department at [facility] Most recent reading at 11/25/2022 10:52 AM ED from 11/25/2022 in [facility] Most recent reading at 11/25/2022  4:30 AM ED from 11/10/2022 in [facility] Emergency Department at [facility] Most recent reading at 11/10/2022  9:54 PM  C-SSRS RISK CATEGORY No Risk No Risk No Risk       AIMS:  , , ,  ,   ASAM:    Substance Abuse:     Past Medical History:  Past Medical History:  Diagnosis Date   Acid reflux    Alcohol abuse    Anemia    Depression    GERD (gastroesophageal reflux disease)    H/O suicide attempt    Hypertension    Vaginal Pap smear, abnormal     Past Surgical History:  Procedure  Laterality Date   CORONARY STENT INTERVENTION N/A 04/22/2021   Procedure: CORONARY STENT INTERVENTION;  Surgeon: Elder Negus, MD;  Location: [facility] INVASIVE CV LAB;  Service: Cardiovascular;  Laterality: N/A;   LEEP     LEFT HEART CATH AND CORONARY ANGIOGRAPHY N/A 04/22/2021   Procedure: LEFT HEART CATH AND CORONARY ANGIOGRAPHY;  Surgeon: Elder Negus, MD;  Location: [facility] INVASIVE CV LAB;  Service: Cardiovascular;  Laterality: N/A;   Family History:  Family History  Problem Relation Age of Onset   Heart disease Mother    Hyperlipidemia Mother    Hypertension Mother    Stroke Mother    Heart disease Father    Asthma Sister    Heart attack Maternal Grandmother    Asthma Son     Social History:  Social History   Substance and Sexual Activity  Alcohol Use Yes   Comment: Hx alcohol  abuse; occassionally     Social History   Substance and Sexual Activity  Drug Use No   Types: Marijuana   Comment: occasionally    Social History   Socioeconomic History   Marital status: Single    Spouse name: Not on file   Number of children: 3   Years of education: Not on file   Highest education level: Not on file  Occupational History   Not on file  Tobacco Use   Smoking status: Former    Current packs/day: 0.00    Average packs/day: 0.3 packs/day for 1 year (0.3 ttl pk-yrs)    Types: Cigarettes    Start date: 12/07/1998    Quit date: 12/07/1999    Years since quitting: 22.9   Smokeless tobacco: Never   Tobacco comments:    smoked 3-4 years  Vaping Use   Vaping status: Never Used  Substance and Sexual Activity   Alcohol use: Yes    Comment: Hx alcohol abuse; occassionally   Drug use: No    Types: Marijuana    Comment: occasionally   Sexual activity: Not Currently    Birth control/protection: None  Other Topics Concern   Not on file  Social History Narrative   Not on file   Social Determinants of Health   Financial Resource Strain: Not on file  Food Insecurity:  Not on file  Transportation Needs: Not on file  Physical Activity: Not on file  Stress: Not on file  Social Connections: Unknown (11/07/2022)   Received from [facility]   Social Connections    Frequency of Communication with Friends and Family: Not asked    Frequency of Social Gatherings with Friends and Family: Not asked     Allergies:  No Known Allergies  Labs:  Results for orders placed or performed during the hospital encounter of 11/25/22 (from the past 48 hour(s))  Comprehensive metabolic panel     Status: Abnormal   Collection Time: 11/25/22 11:32 AM  Result Value Ref Range   Sodium 131 (L) 135 - 145 mmol/L   Potassium 3.5 3.5 - 5.1 mmol/L   Chloride 98 98 - 111 mmol/L   CO2 22 22 - 32 mmol/L   Glucose, Bld 96 70 - 99 mg/dL    Comment: Glucose reference range applies only to samples taken after fasting for at least 8 hours.   BUN 10 6 - 20 mg/dL   Creatinine, Ser 0.10 0.44 - 1.00 mg/dL   Calcium 9.3 8.9 - 93.2 mg/dL   Total Protein 7.6 6.5 - 8.1 g/dL   Albumin 3.9 3.5 - 5.0 g/dL   AST 17 15 - 41 U/L   ALT 20 0 - 44 U/L   Alkaline Phosphatase 68 38 - 126 U/L   Total Bilirubin 0.7 0.3 - 1.2 mg/dL   GFR, Estimated >35 >57 mL/min    Comment: (NOTE) Calculated using the CKD-EPI Creatinine Equation (2021)    Anion gap 11 5 - 15    Comment: Performed at [facility], [address], [place], [place] [address]  Ethanol     Status: None   Collection Time: 11/25/22 11:32 AM  Result Value Ref Range   Alcohol, Ethyl (B) <10 <10 mg/dL    Comment: (NOTE) Lowest detectable limit for serum alcohol is 10 mg/dL.  For medical purposes only. Performed at [facility], [address], [place], [place] [address]   Salicylate level     Status: Abnormal   Collection  Time: 11/25/22 11:32 AM  Result Value Ref Range   Salicylate Lvl <7.0 (L) 7.0 - 30.0 mg/dL    Comment: Performed at [facility], [address]  [place], [place] [address]  Acetaminophen level     Status: Abnormal   Collection Time: 11/25/22 11:32 AM  Result Value Ref Range   Acetaminophen (Tylenol), Serum <10 (L) 10 - 30 ug/mL    Comment: (NOTE) Therapeutic concentrations vary significantly. A range of 10-30 ug/mL  may be an effective concentration for many patients. However, some  are best treated at concentrations outside of this range. Acetaminophen concentrations >150 ug/mL at 4 hours after ingestion  and >50 ug/mL at 12 hours after ingestion are often associated with  toxic reactions.  Performed at [facility], [address], [place], [place] [address]   cbc     Status: None   Collection Time: 11/25/22 11:32 AM  Result Value Ref Range   WBC 6.7 4.0 - 10.5 K/uL   RBC 4.10 3.87 - 5.11 MIL/uL   Hemoglobin 12.1 12.0 - 15.0 g/dL   HCT 88.4 16.6 - 06.3 %   MCV 91.7 80.0 - 100.0 fL   MCH 29.5 26.0 - 34.0 pg   MCHC 32.2 30.0 - 36.0 g/dL   RDW 01.6 01.0 - 93.2 %   Platelets 280 150 - 400 K/uL   nRBC 0.0 0.0 - 0.2 %    Comment: Performed at [facility], [address] [place], [place] [address]  hCG, serum, qualitative     Status: None   Collection Time: 11/25/22 11:32 AM  Result Value Ref Range   Preg, Serum NEGATIVE NEGATIVE    Comment:        THE SENSITIVITY OF THIS METHODOLOGY IS >10 mIU/mL. Performed at [facility], [address], [place], [place] [address]     Current Facility-Administered Medications  Medication Dose Route Frequency Provider Last Rate Last Admin   OLANZapine zydis (ZYPREXA) disintegrating tablet 5 mg  5 mg Oral Q8H PRN Motley-Mangrum, Armenta Erskin A, PMHNP       And   LORazepam (ATIVAN) tablet 1 mg  1 mg Oral PRN Motley-Mangrum, Eloni Darius A, PMHNP       And   ziprasidone (GEODON) injection 20 mg  20 mg Intramuscular PRN Motley-Mangrum, Christobal Morado A, PMHNP       Current Outpatient Medications  Medication Sig Dispense Refill   aspirin 81 MG EC  tablet Take 1 tablet (81 mg total) by mouth daily. Swallow whole. 90 tablet 3   diltiazem (CARDIZEM SR) 60 MG 12 hr capsule Take 60 mg by mouth every 12 (twelve) hours.     Evolocumab (REPATHA SURECLICK) 140 MG/ML SOAJ Inject 1 mL into the skin every 14 (fourteen) days. 6 mL 3   ferrous sulfate 325 (65 FE) MG tablet Take 325 mg by mouth daily with breakfast.     folic acid (FOLVITE) 1 MG tablet Take 1 mg by mouth daily as needed (for a deficiency).     linaclotide (LINZESS) 72 MCG capsule Take 72 mcg by mouth daily as needed (for constipation).     lisinopril (ZESTRIL) 2.5 MG tablet Take 1 tablet (2.5 mg total) by mouth daily. 90 tablet 3   metoprolol tartrate (LOPRESSOR) 25 MG tablet Take 1 tablet (25 mg total) by mouth 2 (two) times daily. 180 tablet 3   Multiple Vitamin (MULTIVITAMIN) capsule Take 1 capsule by mouth daily with breakfast.  nitroGLYCERIN (NITROSTAT) 0.4 MG SL tablet Place 1 tablet (0.4 mg total) under the tongue every 5 (five) minutes as needed for chest pain. 30 tablet 1   nortriptyline (PAMELOR) 50 MG capsule Take 50 mg by mouth at bedtime.     NYQUIL HBP COLD & FLU 15-6.25-325 MG/15ML LIQD Take 15-30 mLs by mouth every 6 (six) hours as needed (for cold-like symptoms).     omeprazole (PRILOSEC) 40 MG capsule Take 40 mg by mouth daily as needed (for reflux).     tiZANidine (ZANAFLEX) 4 MG tablet Take 4 mg by mouth every 8 (eight) hours as needed for muscle spasms.     VISINE 0.025-0.3 % ophthalmic solution Place 1 drop into both eyes 4 (four) times daily as needed for eye irritation.     atorvastatin (LIPITOR) 80 MG tablet Take 1 tablet (80 mg total) by mouth daily. (Patient not taking: Reported on 11/25/2022) 90 tablet 3   diclofenac Sodium (VOLTAREN) 1 % GEL Apply 2 g topically daily as needed for pain.     diltiazem (CARDIZEM CD) 240 MG 24 hr capsule Take 1 capsule (240 mg total) by mouth daily. (Patient not taking: Reported on 11/25/2022) 90 capsule 3   isosorbide  mononitrate (IMDUR) 30 MG 24 hr tablet Take 1 tablet (30 mg total) by mouth daily. 30 tablet 3   metoCLOPramide (REGLAN) 10 MG tablet Take 10 mg by mouth every 8 (eight) hours as needed for nausea.     mupirocin ointment (BACTROBAN) 2 % Apply topically 2 (two) times daily. (Patient not taking: Reported on 11/25/2022) 22 g 0   potassium chloride SA (KLOR-CON M) 20 MEQ tablet Take 1 tablet (20 mEq total) by mouth 2 (two) times daily. (Patient not taking: Reported on 11/25/2022) 14 tablet 0   promethazine (PHENERGAN) 25 MG tablet Take 1 tablet (25 mg total) by mouth every 6 (six) hours as needed for nausea or vomiting. (Patient not taking: Reported on 11/25/2022) 30 tablet 0   sertraline (ZOLOFT) 100 MG tablet Take 1 tablet (100 mg total) by mouth daily. (Patient not taking: Reported on 11/25/2022) 30 tablet 1   ticagrelor (BRILINTA) 90 MG TABS tablet Take 1 tablet (90 mg total) by mouth 2 (two) times daily. (Patient not taking: Reported on 11/25/2022) 180 tablet 3    Musculoskeletal: Strength & Muscle Tone: within normal limits Gait & Station: normal Patient leans: N/A   Psychiatric Specialty Exam: Presentation  General Appearance:  Appropriate for Environment  Eye Contact: Fair  Speech: Clear and Coherent  Speech Volume: Normal  Handedness: Right   Mood and Affect  Mood: Anxious; Depressed  Affect: Appropriate; Flat   Thought Process  Thought Processes: Coherent; Disorganized  Descriptions of Associations:Intact  Orientation:Full (Time, Place and Person)  Thought Content:Scattered; Tangential; Paranoid Ideation  History of Schizophrenia/Schizoaffective disorder:No data recorded Duration of Psychotic Symptoms:No data recorded Hallucinations:Hallucinations: None  Ideas of Reference:Paranoia  Suicidal Thoughts:Suicidal Thoughts: No  Homicidal Thoughts:Homicidal Thoughts: No   Sensorium  Memory: Immediate Fair; Recent  Fair  Judgment: Impaired  Insight: Poor   Executive Functions  Concentration: Fair  Attention Span: Fair  Recall: Fair  Fund of Knowledge: Fair  Language: Fair   Psychomotor Activity  Psychomotor Activity: Psychomotor Activity: Normal   Assets  Assets: Communication Skills; Social Support; Desire for Improvement; Housing    Sleep  Sleep: Sleep: Poor   Physical Exam: Physical Exam Vitals and nursing note reviewed. Exam conducted with a chaperone present.  Neurological:     Mental  Status: She is alert.  Psychiatric:        Attention and Perception: Attention normal.        Mood and Affect: Mood is anxious. Affect is flat.        Speech: Speech normal.        Behavior: Behavior is slowed. Behavior is cooperative.        Thought Content: Thought content is paranoid and delusional.        Cognition and Memory: Cognition is impaired.        Judgment: Judgment is inappropriate.    Review of Systems  Constitutional: Negative.   Psychiatric/Behavioral:  Positive for hallucinations.        Delusional    Blood pressure 114/69, pulse (!) 107, temperature 98.3 F (36.8 C), temperature source Oral, resp. rate 18, height 5\' 3"  (1.6 m), weight 98 kg, SpO2 97%. Body mass index is 38.26 kg/m.   Medical Decision Making: Pt case reviewed and discussed with Dr. Lucianne Muss. Patient needs inpatient psychiatric admission for stabilization and treatment. [facility] and CSW notified of disposition. Will restart Zyprexa Qhs. EDP, RN, and LCSW notified of disposition.     Disposition: Recommend psychiatric Inpatient admission.  Alona Bene, PMHNP 11/25/2022 7:51 PM

## 2022-11-25 NOTE — ED Provider Notes (Signed)
Behavioral Health Urgent Care Medical Screening Exam  Patient Name: [name] MRN: [id number] Date of Evaluation: 11/25/22 Chief Complaint:  " I heard people I've been having conflict with are coming to my house". Diagnosis:  Final diagnoses:  Paranoia (HCC)  Encounter for psychological evaluation  Homelessness unspecified    History of Present illness: [name] is a 42 y.o. female.  With psychiatric history of alcohol abuse, substance use disorder, depression, psychosis, and history of suicide attempt, who presented voluntarily to [facility] as a walk in, accompanied by her children (17 & 8) due to paranoia.  Patient was seen face to face by this provider and chart reviewed Per chart review, patient was seen at a different area hospital 11/07/22 after running from her home due to similar complaint of people following her and these same people have put a tracker in her head.  Tonight, patient reports " People that I know and  I've been having conflict with due to a misunderstanding/gossip, about six of them, I heard they are coming to my house trying to do harm to me and my kids, they were outside my home yelling and I called the police but when the police came, they didn't believe me because the people had left, so I asked to be escorted from my home and I went to the police station and they asked me to come get a psychiatric evaluation, and I've been waiting in my car in the police parking lot because I'm scared to go home".   Patient reports she has a mother who lives in [place], but her mobility is limited and she didn't want to go to her mom's house.  Patient reports she doesn't really have any friends to go to because the people she thought were her friends are not really her friends because they talk about her and she didn't feel comfortable staying in their house.   Patient denies substance use, She lives with her kids and a significant other, who she is also accusing of  "communicating with people, telling them to harm me".  Patient reports she has no proof of her allegations towards the six acquaintances or significant other.   Patient reports she is not on any psychiatric medications at this time however she [facility].  Support, encouragement, reassurance provided about ongoing stressors.  Patient is provided with opportunity for questions.  Discussed recommendations for discharge and follow-up with her outpatient therapist. Discussed area homeless shelters and provision of resources prior to discharge.  Safety planning completed.  On evaluation, patient is alert, oriented x 3, and cooperative. Speech is clear, normal rate and coherent. Pt appears casually dressed. Eye contact is fair. Mood is anxious, affect is congruent with mood. Thought process is coherent and goal directed and thought content is WDL. Pt denies SI/HI/AVH. There is no objective indication that the patient is responding to internal stimuli. No delusions elicited during this assessment.    Flowsheet Row ED from 11/10/2022 in [facility] Emergency Department at [facility] ED from 05/27/2022 in [facility] Emergency Department at [facility] ED from 02/24/2022 in [facility] Emergency Department at [facility]  C-SSRS RISK CATEGORY No Risk No Risk No Risk       Psychiatric Specialty Exam  Presentation  General Appearance:Casual  Eye Contact:Fair  Speech:Clear and Coherent  Speech Volume:Normal  Handedness:Right   Mood and Affect  Mood: Anxious  Affect: Congruent   Thought Process  Thought  Processes: Coherent; Goal Directed  Descriptions of Associations:Intact  Orientation:Full (Time, Place and Person)  Thought Content:WDL    Hallucinations:None  Ideas of Reference:Paranoia  Suicidal Thoughts:No  Homicidal Thoughts:No   Sensorium  Memory: Immediate Fair  Judgment: Fair  Insight: Fair   Executive  Functions  Concentration: Good  Attention Span: Good  Recall: Good  Fund of Knowledge: Good  Language: Good   Psychomotor Activity  Psychomotor Activity: Normal   Assets  Assets: Communication Skills; Desire for Improvement   Sleep  Sleep: Fair  Number of hours: No data recorded  Physical Exam: Physical Exam Constitutional:      General: She is not in acute distress.    Appearance: She is not diaphoretic.  HENT:     Head: Normocephalic.     Right Ear: External ear normal.     Left Ear: External ear normal.     Nose: No congestion.  Eyes:     General:        Right eye: No discharge.        Left eye: No discharge.  Cardiovascular:     Rate and Rhythm: Normal rate.  Pulmonary:     Effort: No respiratory distress.  Chest:     Chest wall: No tenderness.  Neurological:     Mental Status: She is alert and oriented to person, place, and time.  Psychiatric:        Attention and Perception: Attention and perception normal.        Mood and Affect: Mood is anxious.        Speech: Speech normal.        Behavior: Behavior is cooperative.        Thought Content: Thought content is paranoid. Thought content does not include homicidal or suicidal ideation. Thought content does not include homicidal or suicidal plan.    Review of Systems  Constitutional:  Negative for chills, diaphoresis and fever.  HENT:  Negative for congestion.   Eyes:  Negative for discharge.  Respiratory:  Negative for cough, shortness of breath and wheezing.   Cardiovascular:  Negative for chest pain and palpitations.  Gastrointestinal:  Negative for diarrhea, nausea and vomiting.  Neurological:  Negative for seizures, loss of consciousness and headaches.  Psychiatric/Behavioral:  Negative for depression, hallucinations, substance abuse and suicidal ideas. The patient is nervous/anxious.    Blood pressure 119/70, pulse 85, temperature 97.7 F (36.5 C), temperature source Oral, resp. rate  17, SpO2 100%. There is no height or weight on file to calculate BMI.  Musculoskeletal: Strength & Muscle Tone: within normal limits Gait & Station: normal Patient leans: N/A   [facility] MSE Discharge Disposition for Follow up and Recommendations: Based on my evaluation the patient does not appear to have an emergency medical condition and can be discharged with resources and follow up care in outpatient services for Individual Therapy  Recommend discharge and follow up with her outpatient therapist. Patient does not meet inpatient psychiatric admission criteria or IVC criteria at this time. There is no evidence of imminent risk of harm to self or others.   Discharge recommendations:  Please follow up with your primary care provider for all medical related needs.   Therapy: We recommend that patient participate in individual therapy to address mental health concerns.  Safety:  The patient should abstain from use of illicit substances/drugs and abuse of any medications. If symptoms worsen or do not continue to improve or if the patient becomes actively suicidal or homicidal then it  is recommended that the patient return to the closest hospital emergency department, the [facility], or call 911 for further evaluation and treatment. National Suicide Prevention Lifeline 1-800-SUICIDE or 901 004 3997.  About 988 988 offers 24/7 access to trained crisis counselors who can help people experiencing mental health-related distress. People can call or text 988 or chat 988lifeline.org for themselves or if they are worried about a loved one who may need crisis support.  Crisis Mobile: [facility]:                     (424)764-4956 (for crisis response 24 hours a day) [facility] Hotline:                                            (508) 215-3500   Patient discharged in stable condition.    Mancel Bale, NP 11/25/2022, 4:24 AM

## 2022-11-25 NOTE — Progress Notes (Signed)
   11/25/22 0400  [facility] Triage Screening (Walk-ins at [facility] only)  How Did You Hear About [place]? Self  What Is the Reason for Your Visit/Call Today? Pt presents to  [facility] voluntarily stating that she is scared to return home. Pt came to [facility] with her children (17 and 8) stating that she was at the police station to report that some people where out to get her and her family. Pt reports that police came to her home 2 weeks ago and found no one so they thought she was delusional. Pt reports that she was hearing them yell outside of her door. Pt reports hx of depression and has a therapist  at [facility]. Pt denies SI,HI,and AVH. Pt denies etoh and drug use.  How Long Has This Been Causing You Problems? 1 wk - 1 month  Have You Recently Had Any Thoughts About Hurting Yourself? No  Are You Planning to Commit Suicide/Harm Yourself At This time? No  Have you Recently Had Thoughts About Hurting Someone Karolee Ohs? No  Are You Planning To Harm Someone At This Time? No  Are you currently experiencing any auditory, visual or other hallucinations? Yes  Please explain the hallucinations you are currently experiencing: Pt denies AVH but states hearing several people outside her apartment trying to get into her home to hurt her and her family.  Have You Used Any Alcohol or Drugs in the Past 24 Hours? No  Do you have any current medical co-morbidities that require immediate attention? No  Clinician description of patient physical appearance/behavior: Pt was cooperative  What Do You Feel Would Help You the Most Today? Stress Management  If access to [facility] was not available, would you have sought care in the Emergency Department? Yes  Determination of Need Routine (7 days)  Options For Referral Outpatient Therapy;Medication Management    Flowsheet Row ED [facility]25[facility] in [facility] ED from 11/10/2022 in [facility] Emergency Department at [facility] ED from 05/27/2022 in [facility]  Emergency Department at [facility]  C-SSRS RISK CATEGORY No Risk No Risk No Risk

## 2022-11-25 NOTE — Discharge Instructions (Addendum)
Discharge recommendations:  Patient is to take medications as prescribed. Please see information for follow-up appointment with psychiatry and therapy. Please follow up with your primary care provider for all medical related needs.   Therapy: We recommend that patient participate in individual therapy to address mental health concerns.  Safety:  The patient should abstain from use of illicit substances/drugs and abuse of any medications. If symptoms worsen or do not continue to improve or if the patient becomes actively suicidal or homicidal then it is recommended that the patient return to the closest hospital emergency department, the [facility], or call 911 for further evaluation and treatment. National Suicide Prevention Lifeline 1-800-SUICIDE or 772-012-3997.  About 988 988 offers 24/7 access to trained crisis counselors who can help people experiencing mental health-related distress. People can call or text 988 or chat 988lifeline.org for themselves or if they are worried about a loved one who may need crisis support.  Crisis Mobile: [facility]:                     579 630 9879 (for crisis response 24 hours a day) [facility] Hotline:                                            (773)603-4965

## 2022-11-25 NOTE — Progress Notes (Addendum)
Pt was accepted to [facility] TOMORROW 11/26/2022; Bed Assignment 300  Pt meets inpatient criteria per Alona Bene, PMHNP  Attending Physician will be Dr. Phyllis Ginger. [place], MD  Report can be called to: [phone]  Pt can arrive after: 8:00am  Care Team notified: [place] Ponder [facility], PMHNP    Kelton Pillar, LCSWA 11/25/2022 @ 9:14 PM

## 2022-11-25 NOTE — Progress Notes (Signed)
LCSW Progress Note  [id number]   [name]  11/25/2022  8:22 PM    Inpatient Behavioral Health Placement  Pt meets inpatient criteria per Alona Bene, PMHNP. There are no available beds within [facility] system per Evening [facility] Rosey Bath, RN. Referral was sent to the following facilities;   Destination  Service Provider Address Phone Fax  [facility]  [address], [place] [address] [phone]  [facility]  [address] [place] [address] [phone]  [facility]  [address] [place], [place] [address] [phone]  [facility] [address]  [place] [address] [phone]  [facility] Patient Placement  [facility], [place] [phone]  [facility]  [address], [place] [address] [phone]  [facility]  [address], [place] [address] [phone]  [facility]  [address] [place] [address] [phone]  [facility]  [address], [place] [address] [phone]  [facility]  [address] [place] [address] [phone]  [facility]  [address], [place] [address] [phone]  [facility]iat[facility] Inpatient [facility]  [place] [phone]  [facility]  [address] [place] [address] [phone]  [facility]  [address] [place] [address] [phone]  [facility]  [address] [place] [address]  [phone]  [facility]  [facility] Regino Bellow [place] [address] [phone]  [facility]  [address] [place] [address] [phone]  [facility] [address]  [address] [place] [address] [phone]  [facility]  [address] [place], [place] [address] [phone]  [facility]  [address] [place] [address] [phone]  [facility]  [address] [place], [place] [address] [phone]  [facility]  [address] [place] [address] [phone]  [facility]  [address] [place] [address] [phone]  [facility]  [address] [place] [address] [phone]  [facility]  [address], [place] [address] [phone]  [facility]  [place] [phone]  [facility]  [address] [place] [address] [phone]  [facility] EFAX  [address] Karolee Ohs [place] [phone]  [facility]  [address] [place] [address] [phone]  [facility]  [address] [place] [address] [phone]  [facility]  [address], [place] [address] [phone]    Situation ongoing,  CSW will follow up.    Maryjean Ka, MSW, LCSWA 11/25/2022 8:22 PM

## 2022-11-25 NOTE — ED Notes (Signed)
Pt belongings placed in tcu locker 40. One pt belonging bag w/cell phone, pajama bottoms and a t shirt. Pink sandals and red socks. Bag was labeled.
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
## 2022-11-25 NOTE — ED Triage Notes (Signed)
Patient here from home brought in by [facility] reporting being seen at [facility] last night. Paranoia, delusions, and hallucinations. Thinks people are in home to harm her and states that there are multiple people outside home with guns. Reports ongoing stress.
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
## 2022-11-25 NOTE — ED Provider Notes (Signed)
[place] EMERGENCY DEPARTMENT AT [facility] Provider Note   CSN: [id number] Arrival date & time: 11/25/22  1026     History  Chief Complaint  Patient presents with   Delusional   Hallucinations    [name] is a 42 y.o. female.  HPI   Pt presented to the ED with complaints of hallucinations, paranoia.  Pt was seen at [facility] with similar symptoms last evening.  Patient states she has noticed people in her home.  Also people outside of her home with guns.  She has had a lot of stress recently.  Patient called the police today because of her concerns about people with guns.  Police did not see anyone so she was sent to the ED for psychiatric evaluation as the concern is she is hallucinating.  When patient was seen at the behavioral health urgent care last evening she was cleared psychiatrically  Home Medications Prior to Admission medications   Medication Sig Start Date End Date Taking? Authorizing Provider  aspirin 81 MG EC tablet Take 1 tablet (81 mg total) by mouth daily. Swallow whole. 05/07/21   Patwardhan, Anabel Bene, MD  atorvastatin (LIPITOR) 80 MG tablet Take 1 tablet (80 mg total) by mouth daily. 05/07/21   Patwardhan, Anabel Bene, MD  diclofenac Sodium (VOLTAREN) 1 % GEL Apply 2 g topically daily as needed for pain.    [provider]  diltiazem (CARDIZEM CD) 240 MG 24 hr capsule Take 1 capsule (240 mg total) by mouth daily. 01/18/22   Patwardhan, Manish J, MD  Evolocumab (REPATHA SURECLICK) 140 MG/ML SOAJ Inject 1 mL into the skin every 14 (fourteen) days. 10/13/21   Patwardhan, Anabel Bene, MD  folic acid (FOLVITE) 1 MG tablet Take 1 mg by mouth daily.    [provider]  isosorbide mononitrate (IMDUR) 30 MG 24 hr tablet Take 1 tablet (30 mg total) by mouth daily. 01/18/22 01/13/23  Patwardhan, Anabel Bene, MD  linaclotide (LINZESS) 72 MCG capsule Take 72 mcg by mouth daily before breakfast.    [provider]  lisinopril (ZESTRIL) 2.5 MG tablet  Take 1 tablet (2.5 mg total) by mouth daily. Patient not taking: Reported on 01/18/2022 05/07/21   Elder Negus, MD  metoCLOPramide (REGLAN) 10 MG tablet Take 10 mg by mouth every 8 (eight) hours as needed for nausea.    [provider]  metoprolol tartrate (LOPRESSOR) 25 MG tablet Take 1 tablet (25 mg total) by mouth 2 (two) times daily. 01/18/22   Patwardhan, Anabel Bene, MD  Multiple Vitamin (MULTIVITAMIN) capsule Take 1 capsule by mouth daily.    [provider]  mupirocin ointment (BACTROBAN) 2 % Apply topically 2 (two) times daily. 02/24/22   Theron Arista, PA-C  nitroGLYCERIN (NITROSTAT) 0.4 MG SL tablet Place 1 tablet (0.4 mg total) under the tongue every 5 (five) minutes as needed for chest pain. 05/07/21 01/18/22  Patwardhan, Anabel Bene, MD  nortriptyline (PAMELOR) 50 MG capsule Take 50 mg by mouth at bedtime. 03/17/21   [provider]  omeprazole (PRILOSEC) 40 MG capsule Take 1 tablet by mouth daily.    [provider]  potassium chloride SA (KLOR-CON M) 20 MEQ tablet Take 1 tablet (20 mEq total) by mouth 2 (two) times daily. 11/11/22   Palumbo, April, MD  promethazine (PHENERGAN) 25 MG tablet Take 1 tablet (25 mg total) by mouth every 6 (six) hours as needed for nausea or vomiting. 05/28/22   Antony Madura, PA-C  sertraline (ZOLOFT)  100 MG tablet Take 1 tablet (100 mg total) by mouth daily. 04/19/14   Adam Phenix, MD  sertraline (ZOLOFT) 100 MG tablet Take 1 tablet by mouth as needed.    [provider]  ticagrelor (BRILINTA) 90 MG TABS tablet Take 1 tablet (90 mg total) by mouth 2 (two) times daily. 01/18/22   Patwardhan, Anabel Bene, MD  tiZANidine (ZANAFLEX) 4 MG tablet Take 4 mg by mouth every 8 (eight) hours as needed for muscle spasms.    [provider]      Allergies    Patient has no known allergies.    Review of Systems   Review of Systems  Physical Exam Updated Vital Signs BP 114/69 (BP Location: Right Arm)   Pulse (!)  107   Temp 98.3 F (36.8 C) (Oral)   Resp 18   Ht 1.6 m (5\' 3" )   Wt 98 kg   SpO2 97%   BMI 38.26 kg/m  Physical Exam Vitals and nursing note reviewed.  Constitutional:      General: She is not in acute distress.    Appearance: She is well-developed.  HENT:     Head: Normocephalic and atraumatic.     Right Ear: External ear normal.     Left Ear: External ear normal.  Eyes:     General: No scleral icterus.       Right eye: No discharge.        Left eye: No discharge.     Conjunctiva/sclera: Conjunctivae normal.  Neck:     Trachea: No tracheal deviation.  Cardiovascular:     Rate and Rhythm: Normal rate and regular rhythm.  Pulmonary:     Effort: Pulmonary effort is normal. No respiratory distress.     Breath sounds: Normal breath sounds. No stridor. No wheezing or rales.  Abdominal:     General: Bowel sounds are normal. There is no distension.     Palpations: Abdomen is soft.     Tenderness: There is no abdominal tenderness. There is no guarding or rebound.  Musculoskeletal:        General: No tenderness or deformity.     Cervical back: Neck supple.  Skin:    General: Skin is warm and dry.     Findings: No rash.  Neurological:     General: No focal deficit present.     Mental Status: She is alert.     Cranial Nerves: No cranial nerve deficit, dysarthria or facial asymmetry.     Sensory: No sensory deficit.     Motor: No abnormal muscle tone or seizure activity.     Coordination: Coordination normal.  Psychiatric:        Attention and Perception: She perceives visual hallucinations.        Mood and Affect: Mood normal.     ED Results / Procedures / Treatments   Labs (all labs ordered are listed, but only abnormal results are displayed) Labs Reviewed  COMPREHENSIVE METABOLIC PANEL - Abnormal; Notable for the following components:      Result Value   Sodium 131 (*)    All other components within normal limits  SALICYLATE LEVEL - Abnormal; Notable for the  following components:   Salicylate Lvl <7.0 (*)    All other components within normal limits  ACETAMINOPHEN LEVEL - Abnormal; Notable for the following components:   Acetaminophen (Tylenol), Serum <10 (*)    All other components within normal limits  ETHANOL  CBC  HCG, SERUM, QUALITATIVE  RAPID URINE DRUG SCREEN, HOSP PERFORMED    EKG None  Radiology No results found.  Procedures Procedures    Medications Ordered in ED Medications - No data to display  ED Course/ Medical Decision Making/ A&P Clinical Course as of 11/25/22 1248  Thu Nov 25, 2022  1247 Labs reviewed.  CBC normal.  Metabolic panel shows decreased sodium level but doubt this is clinically significant.  Alcohol negative.  Salicylate and acetaminophen negative [JK]    Clinical Course User Index [JK] Linwood Dibbles, MD                                 Medical Decision Making Amount and/or Complexity of Data Reviewed Labs: ordered.    Patient presents to the ER for evaluation of possible delusions and hallucinations.  Patient feels that people are out to get her.  Patient called police today and they did not see anyone outside reportedly.  Patient was felt to have a psychiatric issue so she was sent to the ED for evaluation.  Patient is medically cleared.  Will ask for psychiatric evaluation       Final Clinical Impression(s) / ED Diagnoses Final diagnoses:  Paranoia [facility])  Visual hallucination    Rx / DC Orders ED Discharge Orders     None         Linwood Dibbles, MD 11/25/22 1249

## 2022-11-25 NOTE — ED Notes (Signed)
[facility] called pts mother [name]) for collateral. Pts mother said that pt had been diagnosed as bipolar but has not been taking meds for the last three years. Pts mother reports that pt experiences both visual and auditory hallucinations with voices telling her that they are going to kill her and her family. Pt has tried to kill herself 2 times many years ago after both of her younger children were born.  As per pts mother last night pt had been driving around in her car with her children for hours which caused the police to intervene and bring the children to stay with her mother. Mother reports that patient has increasingly become paranoid believing that people are trying to kill her and her children. Pts mother said that this is the "worst she has ever seen" her daughter.  Mother is very happy that her daughter in in the hospital getting the help that she needs. Mother needs to get pts keys to her house so that she can pick up items for pts children.    [name], [facility]  11/25/22

## 2022-11-25 NOTE — ED Notes (Signed)
Pt changed into purple scrubs and has been wanded by security   Pt belongings placed in triage cabinet

## 2022-11-25 NOTE — ED Notes (Signed)
Pt stated she pulled the code alarm due to her not feeling safe and feeling like someone is going to harm her.

## 2022-11-26 MED ORDER — STERILE WATER FOR INJECTION IJ SOLN
INTRAMUSCULAR | Status: AC
  Administered 2022-11-26: 1.2 mL
  Filled 2022-11-26: qty 10

## 2022-11-26 MED ORDER — LORAZEPAM 2 MG/ML IJ SOLN
1.0000 mg | Freq: Once | INTRAMUSCULAR | Status: AC
  Administered 2022-11-26: 1 mg via INTRAMUSCULAR
  Filled 2022-11-26: qty 1

## 2022-11-26 NOTE — ED Notes (Signed)
[facility] received a call back from Marisue Ivan [phone]), the CPS social worker assigned to pts child custody case. Marisue Ivan informed [facility] that the pts children were removed from the grandmothers custody because she resides in a retirement community and the conditions are not appropriate for more than a night or two. Pts children were sleeping on a pallet on the floor.   Marisue Ivan has been working with the family to find another family member who could care for the children. Marisue Ivan has also been trying to contact the fathers of her children to see if they might be an appropriate placement. Marisue Ivan explained that the process of find a placement or the pt regaining custody may take some time and would likely be after pt is discharged from the hospital. Marisue Ivan said that a case plan with a foster care social worker will be developed where there will requirements for the pt to complete in order to regain custody, one being stable mental health.   Jacquelynn Cree, [facility]   11/26/22

## 2022-11-26 NOTE — ED Notes (Signed)
Pt came to the window advising she waas feweling chest pain in the center of her chest that radiated to her back. Ptt advised suddent onset with SOB as well. Pt was shaking and denied any feelings of anxiety prior to the chest pain. Dr Bebe Shaggy made aware

## 2022-11-26 NOTE — ED Notes (Signed)
Pt informed [facility] that her children were removed from her mothers custody today by police. [place] law requires permission from the parent to allow her family to have custody so pts children were placed in foster care. Pt has a court appearance this coming Wednesday (12/01/22 at 9am) where she can provide permission.   [facility] obtained the phone number that pt was given by her mother for possibly a lawyer who has been assigned to the case. [facility] called the number and left a message to return the call. [facility] also called the [facility] [phone]) to find out case information. [facility] spoke with Almira Coaster (Radiation protection practitioner) who said that she had just received the case but to her knowledge no lawyer had been assigned to the case yet. [facility] provided contact information for the lawyer to call for an update on the pts case.  Almira Coaster said that there is a way for pts to appear through [facility] but said that the process of custody can take some time. Pts. Court appearance information is: [facility] [place] [address], [address]. [facility] will notify pts mother of the information and that she is being encouraged to be at the appearance to find out more about the case.  Jacquelynn Cree, [facility]  11/26/2022

## 2022-11-26 NOTE — ED Notes (Signed)
This Paramedic was discussing discharge papers with another pt when this young lady began screaming and beating her hands on the the countertop in front of the window. NT advised this paramedic that pt went into the day room and began screaming and flipping over chairs and the table. Off duty [employer] and security called to assist with verbal de-escalation however, pt continued acting unsafely and PRN Ziprasidone was given to prevent pt from continuing to be a risk to herself and other people around her. ED provider [facility] made aware of situation as well.

## 2022-11-26 NOTE — ED Notes (Addendum)
The patient had Lorazepam injection due at 07:00. When I asked her if I could administer same, she asked if I could give her "a lot" of different medications. I explained to her that she could only get what has been ordered for her. She asked for extra medications. When I asked her why she wanted extra medication, she responded saying, "I don't want to wake up"

## 2022-11-26 NOTE — ED Notes (Signed)
Pt asks this writer to come back into her room to ask a question. Pt then asks writer " can we go ahead and not delay the inevitable. Let's go ahead and give me all the shots so that I can go to sleep and not wake up." Pt educated that it is illegal to do that and pt then states " can you just give me something to help me sleep and not feel things." ED provider was asked if pt could have any sleep aids and ED provider states with IM medication already given to hold off and let the medicine take effect.

## 2022-11-26 NOTE — ED Notes (Signed)
Lunch tray given. 

## 2022-11-26 NOTE — ED Provider Notes (Signed)
EKG Interpretation Date/Time:  Friday November 26 2022 06:50:00 EDT Ventricular Rate:  127 PR Interval:  146 QRS Duration:  66 QT Interval:  296 QTC Calculation: 430 R Axis:   20  Text Interpretation: Sinus tachycardia with Premature ventricular complexes or Fusion complexes Nonspecific T wave abnormality Abnormal ECG Confirmed by Zadie Rhine ([id number]) on 11/26/2022 6:56:14 AM       I was informed by nursing the patient woke up and had a panic attack.  When I went to evaluate her she was holding a emesis bag spitting up.  She is refusing medications at this time.   Zadie Rhine, MD 11/26/22 [phone]

## 2022-11-26 NOTE — ED Notes (Signed)
Dinner tray given

## 2022-11-26 NOTE — ED Provider Notes (Signed)
Emergency Medicine Observation Re-evaluation Note  [name] is a 42 y.o. female, seen on rounds today.  Pt initially presented to the ED for complaints of Delusional and Hallucinations Currently, the patient is waiting for transfer to [facility] psychiatric hospital.  Physical Exam  BP (!) 148/88   Pulse 66   Temp 97.8 F (36.6 C) (Oral)   Resp 18   Ht 1.6 m (5\' 3" )   Wt 98 kg   SpO2 100%   BMI 38.26 kg/m  Physical Exam General: Alert, awake Cardiac: Regular rate Lungs: Normal effort Psych: Depressed  ED Course / MDM  EKG:EKG Interpretation Date/Time:  Friday November 26 2022 06:50:00 EDT Ventricular Rate:  127 PR Interval:  146 QRS Duration:  66 QT Interval:  296 QTC Calculation: 430 R Axis:   20  Text Interpretation: Sinus tachycardia with Premature ventricular complexes or Fusion complexes Nonspecific T wave abnormality Abnormal ECG Confirmed by Zadie Rhine ([id number]) on 11/26/2022 6:56:14 AM  I have reviewed the labs performed to date as well as medications administered while in observation.  Recent changes in the last 24 hours include episode of agitation this morning.  Patient is also asking me to give her a lot of medications at once.  Patient states she does not want to wake up.  Plan  Patient continues to be depressed suicidal .  Explained to her that we are not going to give her a lot of medications at once so that she never wakes up.  We will try to give her some medications to ease her pain and suffering.  We are still planning on inpatient psychiatric treatment to help her with her mental health crisis    Linwood Dibbles, MD 11/26/22 [phone]

## 2022-11-26 NOTE — ED Notes (Signed)
Report was called to [place] at [facility]

## 2022-11-26 NOTE — ED Notes (Signed)
Pt received lunch tray

## 2022-11-26 NOTE — ED Notes (Signed)
Pt. Remains resting comfortably at this time.

## 2022-11-26 NOTE — ED Notes (Addendum)
Pt started banging on things in the bathroom and well as banging on the window stating that she had to leave and go find her kids. Pt calmed down for a little bit but then pt went Into day the day room and flipped both chairs and tables over. Talking loudly  Security and off duty went back there, while they were back there pt started jerking on the sink on the hall wall trying to get it off the wall. Paramedic was notified.

## 2022-11-27 NOTE — ED Notes (Signed)
Pt given breakfast tray, eating quietly in room.

## 2022-11-27 NOTE — ED Provider Notes (Signed)
Emergency Medicine Observation Re-evaluation Note  [name] is a 42 y.o. female, seen on rounds today.  Pt initially presented to the ED for complaints of Delusional and Hallucinations Currently, the patient is no acute distress.  Physical Exam  BP 135/82 (BP Location: Left Arm)   Pulse 98   Temp 98.2 F (36.8 C) (Oral)   Resp 17   Ht 1.6 m (5\' 3" )   Wt 98 kg   SpO2 100%   BMI 38.26 kg/m  Physical Exam   ED Course / MDM  EKG:EKG Interpretation Date/Time:  Friday November 26 2022 06:50:00 EDT Ventricular Rate:  127 PR Interval:  146 QRS Duration:  66 QT Interval:  296 QTC Calculation: 430 R Axis:   20  Text Interpretation: Sinus tachycardia with Premature ventricular complexes or Fusion complexes Nonspecific T wave abnormality Abnormal ECG Confirmed by Zadie Rhine ([id number]) on 11/26/2022 6:56:14 AM  I have reviewed the labs performed to date as well as medications administered while in observation.  Recent changes in the last 24 hours include none.  Plan  Current plan is for transfer.    Lorre Nick, MD 11/27/22 [phone]

## 2022-11-27 NOTE — ED Notes (Signed)
Pt went to use the bathroom and came to ask for "a shot in my arm." Advised pt that she did not need a shot, pt became slightly agitated and advised she did not want to become anxious.Pt agreed to taking an oral medication to assist to calm her.

## 2023-01-03 ENCOUNTER — Other Ambulatory Visit: Payer: Self-pay | Admitting: Physician Assistant

## 2023-01-03 DIAGNOSIS — N644 Mastodynia: Secondary | ICD-10-CM

## 2023-01-19 ENCOUNTER — Ambulatory Visit
Admission: RE | Admit: 2023-01-19 | Discharge: 2023-01-19 | Disposition: A | Discharge status: Home / Self Care | Payer: Medicaid Other | Source: Ambulatory Visit | Attending: Physician Assistant | Admitting: Physician Assistant

## 2023-01-19 ENCOUNTER — Ambulatory Visit
Admission: RE | Admit: 2023-01-19 | Discharge: 2023-01-19 | Disposition: A | Discharge status: Home / Self Care | Payer: MEDICAID | Source: Ambulatory Visit | Attending: Physician Assistant | Admitting: Physician Assistant

## 2023-01-19 ENCOUNTER — Other Ambulatory Visit: Payer: Self-pay | Admitting: Physician Assistant

## 2023-01-19 DIAGNOSIS — N644 Mastodynia: Secondary | ICD-10-CM

## 2023-05-04 IMAGING — CT CT ANGIO CHEST
2 of 6 series · 17 of 46 positions shown · IV contrast (APPLIED)
Comparison: None.

CLINICAL DATA: Positive D-dimer, chest pain.

EXAM:
CT ANGIOGRAPHY CHEST WITH CONTRAST
TECHNIQUE: Multidetector CT imaging of the chest was performed using the
standard protocol during bolus administration of intravenous
contrast. Multiplanar CT image reconstructions and MIPs were
obtained to evaluate the vascular anatomy.

[Series 7: thins · axial · 0.81mm/px · z∈[+1221,+1452]mm · 14 of 363 slices shown]
[im 16/363  lung]
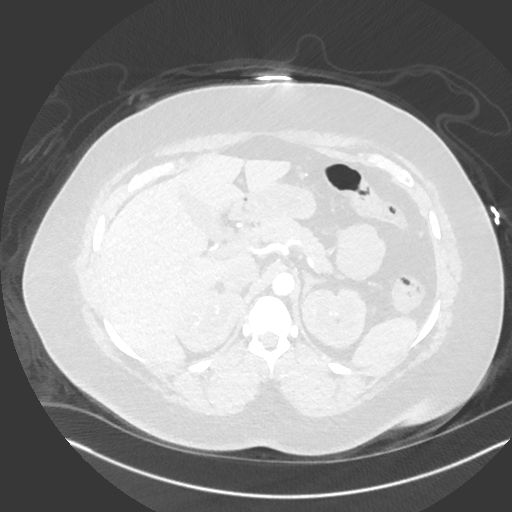
[im 48/363  soft-tissue]
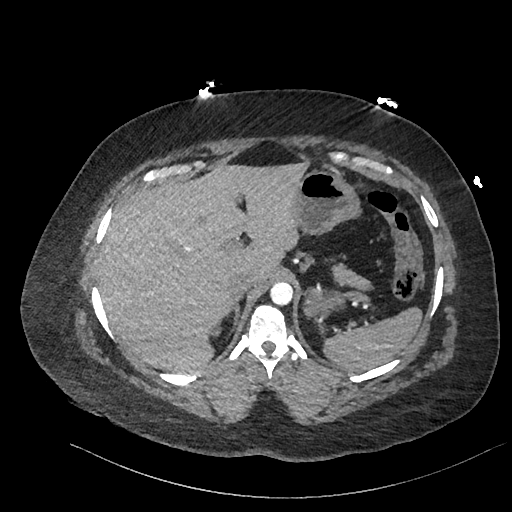
[im 63/363  lung]
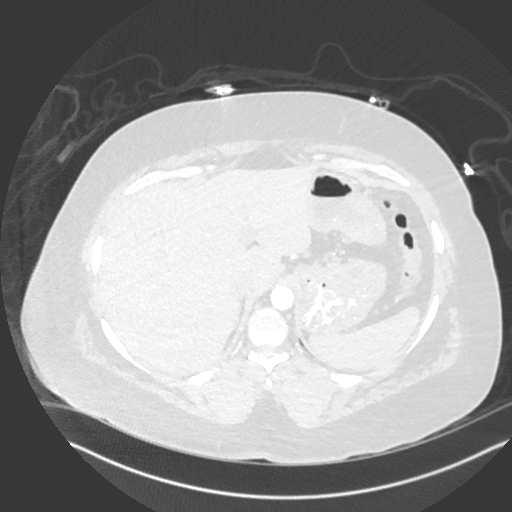
[im 95/363  soft-tissue]
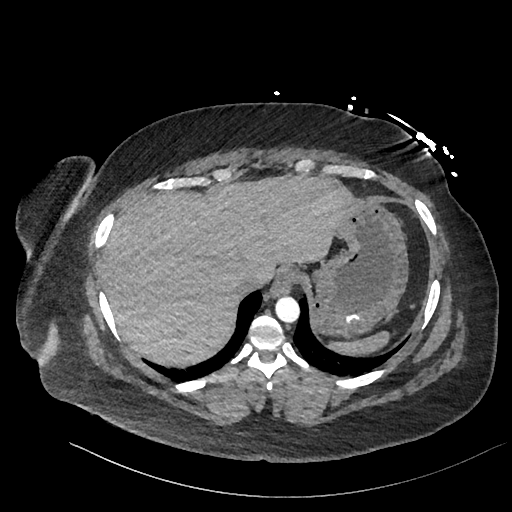
[im 126/363  lung]
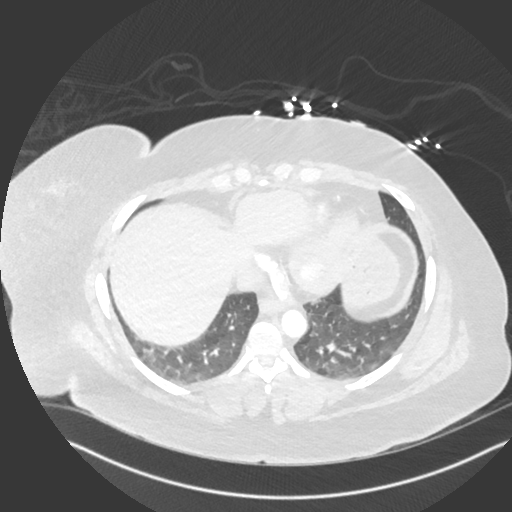
[im 142/363  soft-tissue]
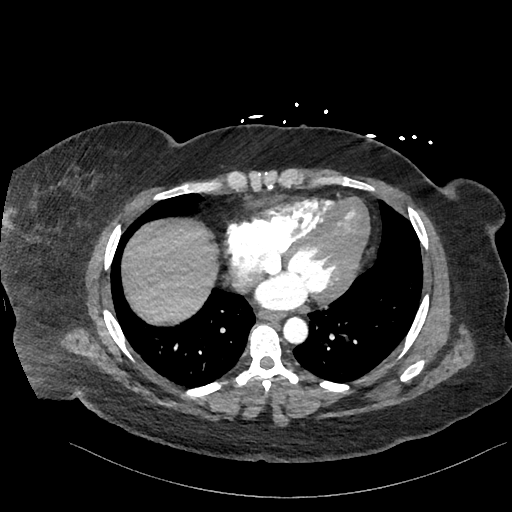
[im 174/363  lung]
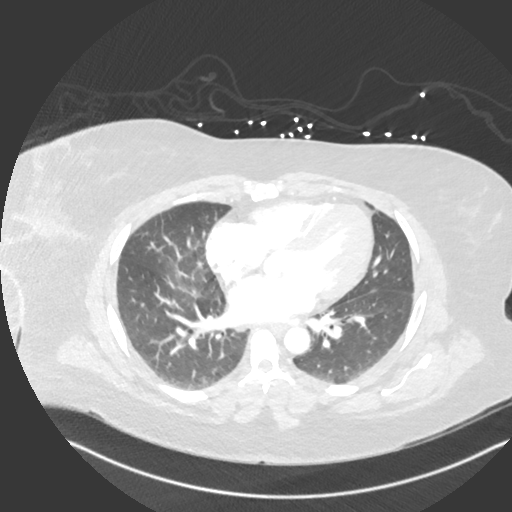
[im 189/363  soft-tissue]
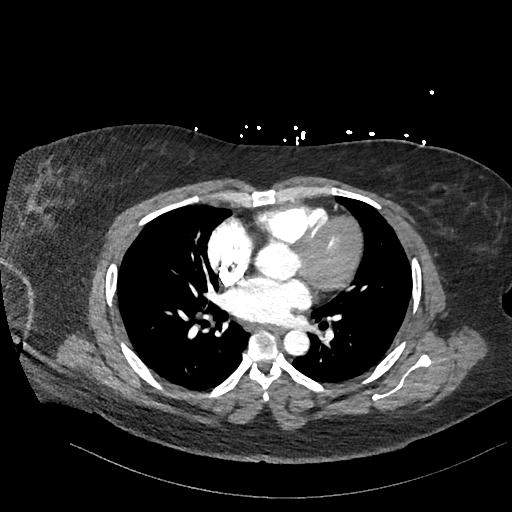
[im 221/363  lung]
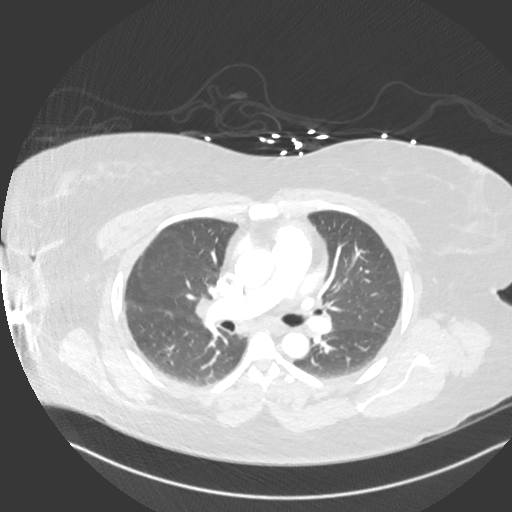
[im 237/363  soft-tissue]
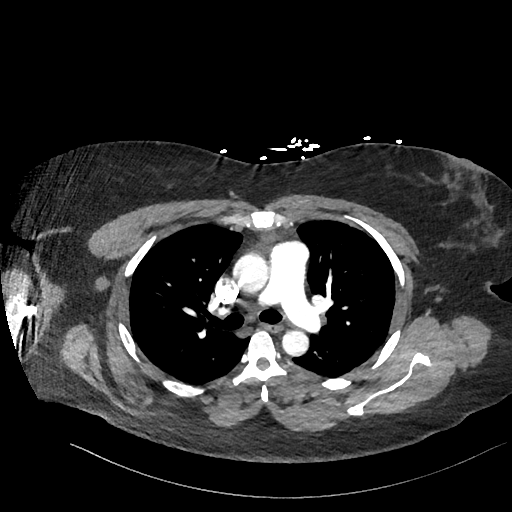
[im 268/363  lung]
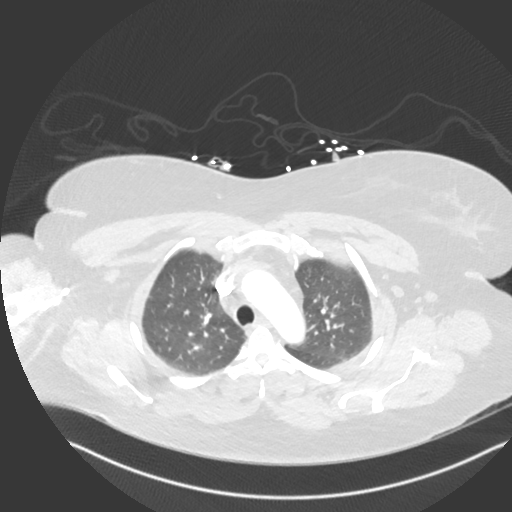
[im 300/363  soft-tissue]
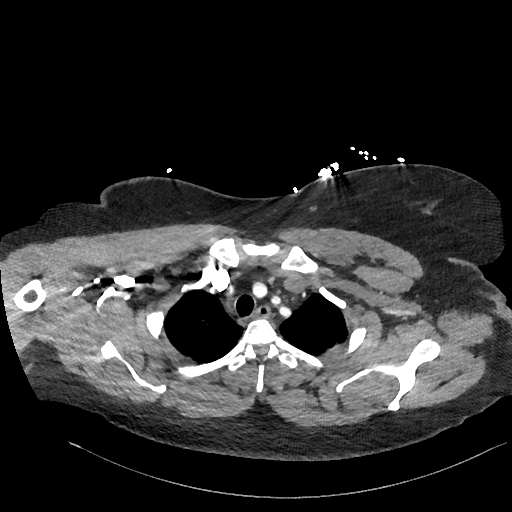
[im 315/363  lung]
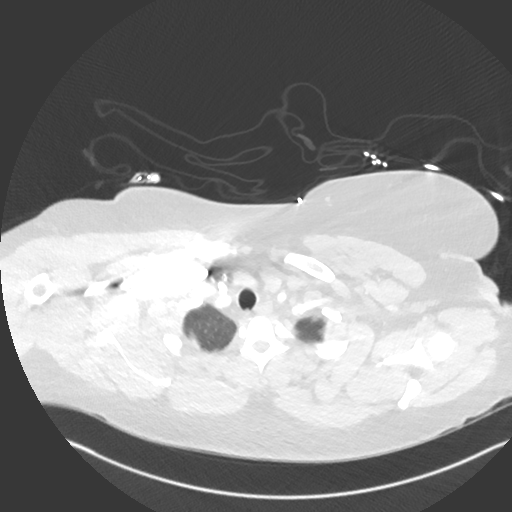
[im 347/363  soft-tissue]
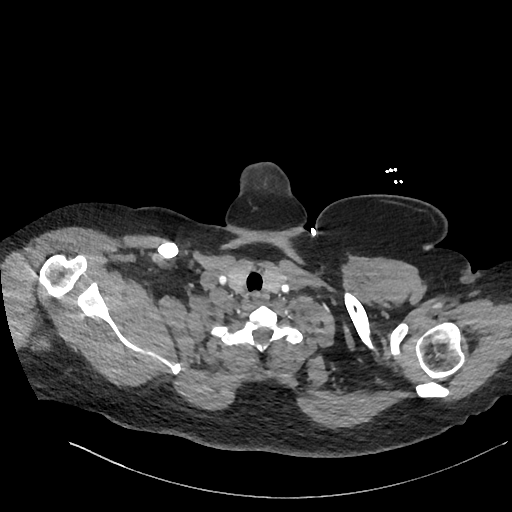

[Series 8: cor · coronal · 0.52mm/px · 3 of 155 slices shown]
[im 39/155  soft-tissue]
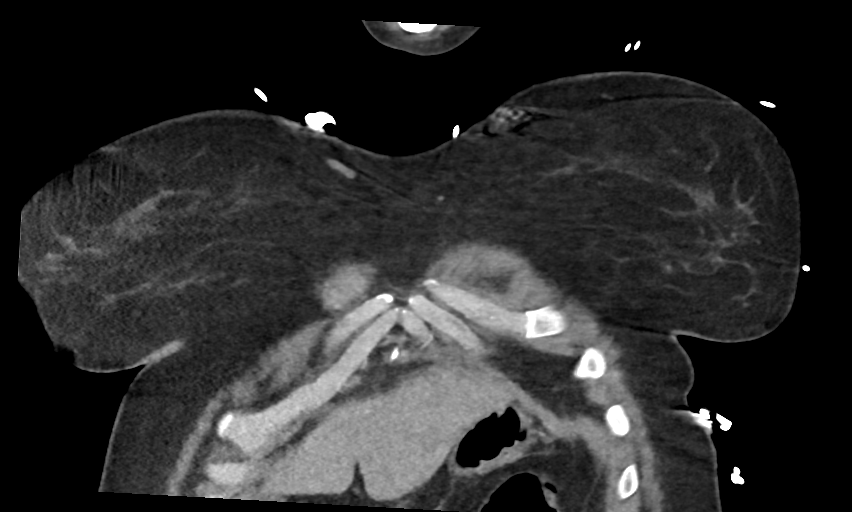
[im 78/155  soft-tissue]
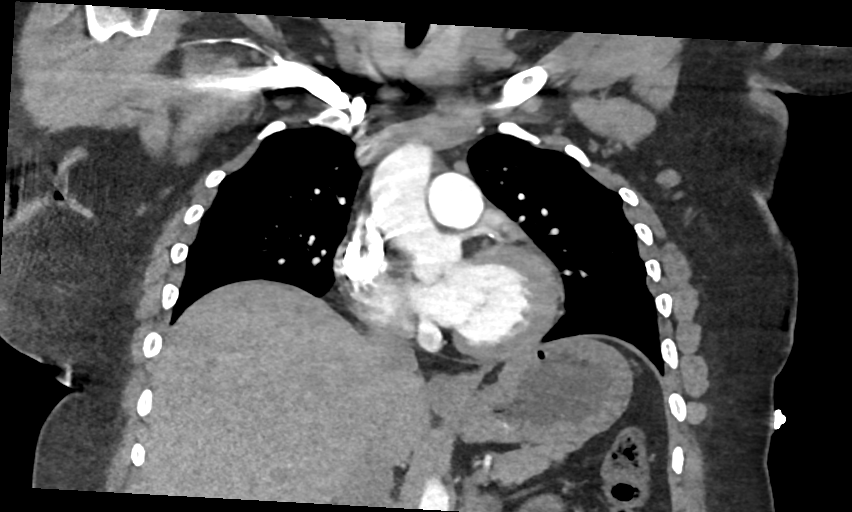
[im 116/155  soft-tissue]
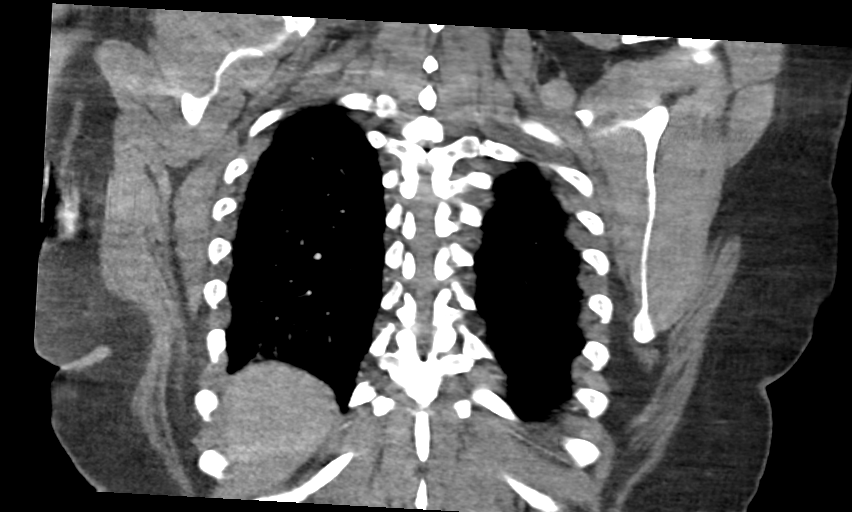

[17 of 46 positions shown; findings below may reference images not displayed]

RADIATION DOSE REDUCTION: This exam was performed according to the
departmental dose-optimization program which includes automated
exposure control, adjustment of the mA and/or kV according to
patient size and/or use of iterative reconstruction technique.

CONTRAST:  60mL OMNIPAQUE IOHEXOL 350 MG/ML SOLN
FINDINGS: Cardiovascular: Satisfactory opacification of the pulmonary arteries
to the segmental level. No evidence of pulmonary embolism. Normal
heart size. No pericardial effusion.

Mediastinum/Nodes: No enlarged mediastinal, hilar, or axillary lymph
nodes. Thyroid gland, trachea, and esophagus demonstrate no
significant findings.

Lungs/Pleura: Lungs are clear. No pleural effusion or pneumothorax.

Upper Abdomen: No acute abnormality.

Musculoskeletal: No chest wall abnormality. No acute or significant
osseous findings.

Review of the MIP images confirms the above findings.
IMPRESSION: No definite evidence of pulmonary embolus. No definite abnormality
seen in the chest.

## 2023-10-25 ENCOUNTER — Other Ambulatory Visit (HOSPITAL_COMMUNITY): Payer: Self-pay

## 2023-10-25 ENCOUNTER — Other Ambulatory Visit: Payer: Self-pay

## 2023-10-25 ENCOUNTER — Encounter: Payer: Self-pay | Admitting: Cardiology

## 2023-10-25 ENCOUNTER — Telehealth: Payer: Self-pay | Admitting: Licensed Clinical Social Worker

## 2023-10-25 ENCOUNTER — Ambulatory Visit: Payer: MEDICAID | Attending: Cardiology | Admitting: Cardiology

## 2023-10-25 ENCOUNTER — Telehealth: Payer: Self-pay | Admitting: Pharmacy Technician

## 2023-10-25 VITALS — BP 102/72 | HR 78 | Ht 63.0 in | Wt 280.0 lb

## 2023-10-25 DIAGNOSIS — R0609 Other forms of dyspnea: Secondary | ICD-10-CM

## 2023-10-25 DIAGNOSIS — I25110 Atherosclerotic heart disease of native coronary artery with unstable angina pectoris: Secondary | ICD-10-CM

## 2023-10-25 DIAGNOSIS — E782 Mixed hyperlipidemia: Secondary | ICD-10-CM

## 2023-10-25 LAB — CBC
Hematocrit: 35.6 % (ref 34.0–46.6)
Hemoglobin: 11.3 g/dL (ref 11.1–15.9)
MCH: 28.9 pg (ref 26.6–33.0)
MCHC: 31.7 g/dL (ref 31.5–35.7)
MCV: 91 fL (ref 79–97)
Platelets: 263 x10E3/uL (ref 150–450)
RBC: 3.91 x10E6/uL (ref 3.77–5.28)
RDW: 13.5 % (ref 11.7–15.4)
WBC: 6.6 x10E3/uL (ref 3.4–10.8)

## 2023-10-25 MED ORDER — NITROGLYCERIN 0.4 MG SL SUBL
0.4000 mg | SUBLINGUAL_TABLET | SUBLINGUAL | 3 refills | Status: AC | PRN
  Filled 2023-10-25: qty 25, 9d supply, fill #0
  Filled 2023-10-31: qty 25, 15d supply, fill #0

## 2023-10-25 MED ORDER — METOPROLOL TARTRATE 25 MG PO TABS
12.5000 mg | ORAL_TABLET | Freq: Two times a day (BID) | ORAL | 3 refills | Status: DC
  Filled 2023-10-25 – 2023-10-31 (×2): qty 90, 90d supply, fill #0

## 2023-10-25 MED ORDER — ROSUVASTATIN CALCIUM 20 MG PO TABS
20.0000 mg | ORAL_TABLET | Freq: Every day | ORAL | 3 refills | Status: AC
  Filled 2023-10-25 – 2023-10-31 (×2): qty 90, 90d supply, fill #0

## 2023-10-25 MED ORDER — ASPIRIN 81 MG PO TBEC: 81.0000 mg | DELAYED_RELEASE_TABLET | Freq: Every day | ORAL | Status: DC

## 2023-10-25 MED ORDER — REPATHA SURECLICK 140 MG/ML ~~LOC~~ SOAJ
1.0000 mL | SUBCUTANEOUS | 3 refills | Status: DC
  Filled 2023-10-25 – 2023-12-29 (×5): qty 6, 84d supply, fill #0

## 2023-10-25 NOTE — Telephone Encounter (Signed)
 Will mail out PAP if office wishes to continue with repatha . Patient is uninsured

## 2023-10-25 NOTE — Progress Notes (Signed)
 [facility] called and stated that the pt is dehydrated and they were only able to get enough blood for the CBC. They asked that we place new orders with the CBC separate from the other labs so they could go ahead and send the CBC off. New CBC order has been placed and released. Lipid panel, Lipoprotein-A, and BMP have been ordered as well under a separate requisition number.

## 2023-10-25 NOTE — Progress Notes (Signed)
 Cardiology Office Note:  .   Date:  10/25/2023  ID:  Vallerie GORMAN Rosebush, DOB 1980-04-23, MRN [id number] PCP: [name], PA  [facility] Providers Cardiologist:  Newman [name], MD PCP: [name], [place]  Chief Complaint  Patient presents with   Coronary Artery Disease   Chest Pain     [name] is a 43 y.o. female with hypertension, hyperlipidemia, prediabetes, obesity, elevated Lp(a), CAD  History of Present Illness  Patient with NSTEMI in 2023, underwent PCI to proximal left circumflex.  There was residual moderate to severe disease that was medically treated.  I last saw the patient in 01/2022.  She has been off most of her medications for last several months.  She is reportedly still taking Brilinta  90 mg twice daily.  Recently, she has had chest pain like someone is squeezing her heart, both at rest and exertion.  Chest pain is worse with exertion like coming up stairs.  1 night, she woke up from sleep with chest pain for which she had to take 3 nitroglycerin  pills.  She also has exertional dyspnea without without chest pain.  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    Vitals:   10/25/23 0819  BP: 102/72  Pulse: 78  SpO2: 96%      Review of Systems  Cardiovascular:  Positive for chest pain and dyspnea on exertion. Negative for leg swelling, palpitations and syncope.        Studies Reviewed: SABRA        EKG 10/25/2023: Sinus rhythm with occasional Premature ventricular complexes When compared with ECG of 26-Nov-2022 06:50, Vent. rate has decreased BY  49 BPM Non-specific change in ST segment in Anterior leads     Coronary intervention 04/22/2021: LM: Normal LAD: Normal Lcx: Large, dominant vessel        Prox 80% eccentric stenosis        Lateral branch off OM1 with ostial 70% stenosis        OM3 ostial 70% stenosis RCA: Nondominant   LVEDP normal   Successful percutaneous coronary  intervention prox LCx        PTCA and stent placement 3.0 X 18 mm Onyx Frontier drug-eluting stent        Post dilatation with 3.25X8 mm [place] balloon at 16 atm   Rest of the moderate residual disease best treated medically   Echocardiogram 04/22/2021: 1. Left ventricular ejection fraction, by estimation, is 55 to 60%. The  left ventricle has normal function. The left ventricle demonstrates  regional wall motion abnormalities (see scoring diagram/findings for  description). Left ventricular diastolic  parameters were normal. There is mild hypokinesis of the left ventricular,  basal inferolateral wall.   2. Right ventricular systolic function is low normal. The right  ventricular size is not well visualized.   3. The mitral valve is normal in structure. No evidence of mitral valve  regurgitation. No evidence of mitral stenosis.   4. The aortic valve is normal in structure. Aortic valve regurgitation is  not visualized. No aortic stenosis is present.   Labs 11/2022: Hb 12.1 Cr 0.9, Na 131   10/2021: Chol 128, TG 118, HDL 61, LDL 44  Physical Exam Vitals and nursing note reviewed.  Constitutional:      General: She is not in acute distress.    Appearance: She is obese.  Neck:     Vascular: No JVD.  Cardiovascular:     Rate and  Rhythm: Normal rate and regular rhythm.     Heart sounds: Normal heart sounds. No murmur heard. Pulmonary:     Effort: Pulmonary effort is normal.     Breath sounds: Normal breath sounds. No wheezing or rales.  Musculoskeletal:     Right lower leg: No edema.     Left lower leg: No edema.      VISIT DIAGNOSES:   ICD-10-CM   1. Coronary artery disease involving native coronary artery of native heart with unstable angina pectoris (HCC)  I25.110 EKG 12-Lead    CBC    Lipid panel    Basic metabolic panel with GFR    Lipoprotein A (LPA)    metoprolol  tartrate (LOPRESSOR ) 25 MG tablet    2. Mixed hyperlipidemia  E78.2 EKG 12-Lead    Lipid panel     Lipoprotein A (LPA)    3. DOE (dyspnea on exertion)  R06.09 ECHOCARDIOGRAM COMPLETE       TYSHELL RAMBERG is a 43 y.o. female with hypertension, obesity, prediabetes, mixed hyperlipidemia, elevated lipoprotein (a), CAD, s/p non-STEMI requiring PCI to proximal left circumflex in 04/2021  Assessment & Plan  CAD: S/p PCI to Prox Lcx severe stenosis, residual severe disease in OM branches. Symptoms of chest pain at both rest and exertion concerning for unstable angina. Currently on Brilinta  90 mg twice daily. Added aspirin  81 mg daily, Crestor  10 mg daily. Refilled Repatha , last taken 2 months ago. Started back on metoprolol  to tartrate 12.5 mg twice daily. Refilled sublingual nitroglycerin . Given patient's symptoms, low normal blood pressure precluding aggressive medical management for angina, recommend coronary angiogram with possible intervention, along with echocardiogram.   I have reviewed the risks, indications, and alternatives to cardiac catheterization, possible angioplasty, and stenting with the patient. Risks include but are not limited to bleeding, infection, vascular injury, stroke, myocardial infection, arrhythmia, kidney injury, radiation-related injury in the case of prolonged fluoroscopy use, emergency cardiac surgery, and death. The patient understands the risks of serious complication is 1-2 in 1000 with diagnostic cardiac cath and 1-2% or less with angioplasty/stenting.    Informed Consent   Shared Decision Making/Informed Consent The risks [stroke (1 in 1000), death (1 in 1000), kidney failure [usually temporary] (1 in 500), bleeding (1 in 200), allergic reaction [possibly serious] (1 in 200)], benefits (diagnostic support and management of coronary artery disease) and alternatives of a cardiac catheterization were discussed in detail with Ms. Badman and she is willing to proceed.      Check CBC, BMP, lipid panel, lipoprotein a.  Meds ordered this encounter   Medications   aspirin  EC 81 MG tablet    Sig: Take 1 tablet (81 mg total) by mouth daily. Swallow whole.   rosuvastatin  (CRESTOR ) 20 MG tablet    Sig: Take 1 tablet (20 mg total) by mouth daily.    Dispense:  90 tablet    Refill:  3   nitroGLYCERIN  (NITROSTAT ) 0.4 MG SL tablet    Sig: Place 1 tablet (0.4 mg total) under the tongue every 5 (five) minutes as needed.    Dispense:  25 tablet    Refill:  3   Evolocumab  (REPATHA  SURECLICK) 140 MG/ML SOAJ    Sig: Inject 140 mg into the skin every 14 (fourteen) days.    Dispense:  6 mL    Refill:  3    Prior auth approved 07/23/2021 until further notice.   metoprolol  tartrate (LOPRESSOR ) 25 MG tablet    Sig: Take 0.5 tablets (12.5 mg  total) by mouth 2 (two) times daily.    Dispense:  180 tablet    Refill:  3     F/u after cath  Signed, Newman JINNY [name], MD

## 2023-10-25 NOTE — Telephone Encounter (Signed)
 H&V Care Navigation CSW Progress Note  Clinical Social Worker received request to complete chart review for coverage, pharmacy team having difficulty pulling coverage. Per [employer] pt has active [employer] coverage ([id number]). Pharmacy able to pull benefits, no additional questions.   Patient is participating in a Managed Medicaid Plan:  No, [employer]  SDOH Screenings   Social Connections: Unknown (11/07/2022)   Received from [facility]  Tobacco Use: Medium Risk (10/25/2023)    Marit Lark, MSW, LCSW Clinical Social Worker II [facility] Heart/Vascular Care Navigation  [phone]- work cell phone (preferred)

## 2023-10-25 NOTE — Patient Instructions (Addendum)
 Medication Instructions:  START Aspirin  81 mg daily START Crestor  20 mg daily  START AS NEEDED FOR CHEST PAIN: Nitroglycerin  START Metoprolol  tartrate 12.5 mg twice daily    *If you need a refill on your cardiac medications before your next appointment, please call your pharmacy*  Lab Work: CBC LIPID PANEL  BMP LP(a)  If you have labs (blood work) drawn today and your tests are completely normal, you will receive your results only by: MyChart Message (if you have MyChart) OR A paper copy in the mail If you have any lab test that is abnormal or we need to change your treatment, we will call you to review the results.  Testing/Procedures: Echo  Your physician has requested that you have an echocardiogram. Echocardiography is a painless test that uses sound waves to create images of your heart. It provides your doctor with information about the size and shape of your heart and how well your heart's chambers and valves are working. This procedure takes approximately one hour. There are no restrictions for this procedure. Please do NOT wear cologne, perfume, aftershave, or lotions (deodorant is allowed). Please arrive 15 minutes prior to your appointment time.  Please note: We ask at that you not bring children with you during ultrasound (echo/ vascular) testing. Due to room size and safety concerns, children are not allowed in the ultrasound rooms during exams. Our front office staff cannot provide observation of children in our lobby area while testing is being conducted. An adult accompanying a patient to their appointment will only be allowed in the ultrasound room at the discretion of the ultrasound technician under special circumstances. We apologize for any inconvenience.  LEFT HEART CATH   Your physician has requested that you have a cardiac catheterization. Cardiac catheterization is used to diagnose and/or treat various heart conditions. Doctors may recommend this procedure for a  number of different reasons. The most common reason is to evaluate chest pain. Chest pain can be a symptom of coronary artery disease (CAD), and cardiac catheterization can show whether plaque is narrowing or blocking your heart's arteries. This procedure is also used to evaluate the valves, as well as measure the blood flow and oxygen levels in different parts of your heart. For further information please visit [email or web address] Please follow instruction sheet, as given.    [facility] A DEPT OF [place]. [facility] AT [address] A DEPT OF THE [place]. [facility] [address] [place] [address] Dept: [phone] Loc: [phone]  [name]  10/25/2023  You are scheduled for a Cardiac Catheterization on Wednesday, August 27 with Dr. Newman [name].  1. Please arrive at the [facility] (Main Entrance A) at [facility]: [address] [place], [place] [address] at 8:00 AM (This time is 2 hour(s) before your procedure to ensure your preparation).   Free valet parking service is available. You will check in at ADMITTING. The support person will be asked to wait in the waiting room.  It is OK to have someone drop you off and come back when you are ready to be discharged.    Special note: Every effort is made to have your procedure done on time. Please understand that emergencies sometimes delay scheduled procedures.  2. Diet: No solid foods after midnight. You may have clear liquids until you arrive at the hospital.  List of approved liquids water , clear juice, clear tea, black coffee, fruit juices, non-citric and without pulp,  carbonated beverages, Gatorade, Kool -Aid, plain Jello-O and plain ice popsicles.   3. Hydration: You need to be well hydrated before your procedure time. You may drink approved liquids (see below) until you arrive at the hospital. On the way to the hospital, please drink a 16-oz (1 plastic bottle) of water .    List of approved liquids water , clear juice, clear tea, black coffee, fruit juices, non-citric and without pulp, carbonated beverages, Gatorade, Kool -Aid, plain Jello-O and plain ice popsicles.   4. Labs: You will need to have blood drawn on 10/25/23 after your office visit. You do not need to be fasting.  5. Medication instructions in preparation for your procedure:   Contrast Allergy: No   Stop taking, Lisinopril  (Zestril  or Prinivil ) Wednesday, August 27,  Please begin taking Repatha  after your procedure. (Since this may cause GI / stomach upset, and because you have not been on this medication for about one year)  On the morning of your procedure, take your Aspirin  81 mg and Brilinta /Ticagrelor  and any morning medicines NOT listed above.  You may use sips of water .  6. Plan to go home the same day, you will only stay overnight if medically necessary. 7. Bring a current list of your medications and current insurance cards. 8. You MUST have a responsible person to drive you home. 9. Someone MUST be with you the first 24 hours after you arrive home or your discharge will be delayed. 10. Please wear clothes that are easy to get on and off and wear slip-on shoes.  Thank you for allowing us  to care for you!   -- [facility]   Follow-Up: At [facility], you and your health needs are our priority.  As part of our continuing mission to provide you with exceptional heart care, our providers are all part of one team.  This team includes your primary Cardiologist (physician) and Advanced Practice Providers or APPs (Physician Assistants and Nurse Practitioners) who all work together to provide you with the care you need, when you need it.  Your next appointment:   2 week(s)  Provider:   One of our Advanced Practice Providers (APPs): Morse Clause, PA-C  Lamarr Satterfield, NP Miriam Shams, NP  Olivia Pavy, PA-C Josefa Beauvais, NP  Leontine Salen, PA-C Orren Fabry, PA-C  [place], PA-C Ernest Dick, NP  Damien Braver, NP Jon Hails, PA-C  Waddell Donath, PA-C    Dayna Dunn, PA-C  Scott Weaver, PA-C Lum Louis, NP Katlyn West, NP Callie Goodrich, PA-C  Evan Williams, PA-C Sheng Haley, PA-C  Xika Zhao, NP Kathleen Johnson, PA-C    Other Instructions

## 2023-10-26 ENCOUNTER — Ambulatory Visit: Payer: Self-pay

## 2023-10-27 ENCOUNTER — Other Ambulatory Visit: Payer: Self-pay | Admitting: *Deleted

## 2023-10-27 ENCOUNTER — Telehealth: Payer: Self-pay | Admitting: *Deleted

## 2023-10-27 DIAGNOSIS — I25110 Atherosclerotic heart disease of native coronary artery with unstable angina pectoris: Secondary | ICD-10-CM

## 2023-10-27 DIAGNOSIS — Z01812 Encounter for preprocedural laboratory examination: Secondary | ICD-10-CM

## 2023-10-27 NOTE — Telephone Encounter (Signed)
 Cardiac Cath 11/02/23-pre-procedure BMP not done 10/25/23 (I called [facility])-pt will return to [facility] 10/27/23 for BMP.

## 2023-10-28 ENCOUNTER — Ambulatory Visit (HOSPITAL_COMMUNITY)
Admission: RE | Admit: 2023-10-28 | Discharge: 2023-10-28 | Disposition: A | Discharge status: Home / Self Care | Payer: MEDICAID | Source: Ambulatory Visit | Attending: Cardiology | Admitting: Cardiology

## 2023-10-28 ENCOUNTER — Ambulatory Visit: Payer: Self-pay | Admitting: Cardiology

## 2023-10-28 DIAGNOSIS — R0609 Other forms of dyspnea: Secondary | ICD-10-CM | POA: Diagnosis present

## 2023-10-28 DIAGNOSIS — I351 Nonrheumatic aortic (valve) insufficiency: Secondary | ICD-10-CM

## 2023-10-28 LAB — ECHOCARDIOGRAM COMPLETE
Area-P 1/2: 4.36 cm2
S' Lateral: 3.2 cm

## 2023-10-28 NOTE — Progress Notes (Signed)
 Normal heart function.  Mild aortic regurgitation.  Repeat echocardiogram in 18 months.  Thanks MJP

## 2023-10-29 LAB — LIPID PANEL
Chol/HDL Ratio: 3.1 ratio (ref 0.0–4.4)
Cholesterol, Total: 169 mg/dL (ref 100–199)
HDL: 55 mg/dL (ref >39)
LDL Chol Calc (NIH): 94 mg/dL (ref 0–99)
Triglycerides: 114 mg/dL (ref 0–149)
VLDL Cholesterol Cal: 20 mg/dL (ref 5–40)

## 2023-10-29 LAB — BASIC METABOLIC PANEL WITH GFR
BUN/Creatinine Ratio: 26 — ABNORMAL HIGH (ref 9–23)
BUN: 23 mg/dL (ref 6–24)
CO2: 18 mmol/L — ABNORMAL LOW (ref 20–29)
Calcium: 9.2 mg/dL (ref 8.7–10.2)
Chloride: 106 mmol/L (ref 96–106)
Creatinine, Ser: 0.89 mg/dL (ref 0.57–1.00)
Glucose: 122 mg/dL — ABNORMAL HIGH (ref 70–99)
Potassium: 4.3 mmol/L (ref 3.5–5.2)
Sodium: 139 mmol/L (ref 134–144)
eGFR: 83 mL/min/1.73 (ref >59)

## 2023-10-29 LAB — LIPOPROTEIN A (LPA): Lipoprotein (a): 199 nmol/L — ABNORMAL HIGH (ref <75.0)

## 2023-10-31 ENCOUNTER — Other Ambulatory Visit (HOSPITAL_COMMUNITY): Payer: Self-pay

## 2023-10-31 ENCOUNTER — Other Ambulatory Visit: Payer: Self-pay

## 2023-10-31 NOTE — Telephone Encounter (Signed)
 Yes, looks like it was started last office visit.

## 2023-10-31 NOTE — Telephone Encounter (Signed)
 Pt is requesting a refill on medication aspirin  81 mg. At last office visit it looks like Dr. Elmira put on Rx OTC for aspirin . Would Dr. Elmira like to prescribe this medication? Please address

## 2023-11-01 ENCOUNTER — Telehealth: Payer: Self-pay | Admitting: *Deleted

## 2023-11-01 NOTE — Telephone Encounter (Signed)
 Cardiac Catheterization scheduled at [facility] for: Wednesday November 02, 2023 10 AM Arrival time [facility] Main Entrance A at: 8 AM  Diet: -Nothing to eat after midnight.  Hydration: -May drink clear liquids until 2 hours before the procedure.  Please drink 16 oz bottle of water  2 hours before procedure. Approved liquids: Water , clear tea, black coffee, fruit juices-non-citric and without pulp,Gatorade, plain Jello/popsicles.  Medication instructions: -Usual morning medications can be taken including aspirin  81 mg and Brilinta  90 mg.  Plan to go home the same day, you will only stay overnight if medically necessary.  You must have responsible adult to drive you home.  Someone must be with you the first 24 hours after you arrive home.  Reviewed procedure instructions with patient.

## 2023-11-02 ENCOUNTER — Other Ambulatory Visit: Payer: Self-pay

## 2023-11-02 ENCOUNTER — Ambulatory Visit (HOSPITAL_COMMUNITY)
Admission: RE | Disposition: A | Discharge status: Home / Self Care | Payer: MEDICAID | Source: Home / Self Care | Attending: Cardiology

## 2023-11-02 ENCOUNTER — Ambulatory Visit (HOSPITAL_COMMUNITY)
Admission: RE | Admit: 2023-11-02 | Discharge: 2023-11-03 | Disposition: A | Discharge status: Home / Self Care | Payer: MEDICAID | Attending: Cardiology | Admitting: Cardiology

## 2023-11-02 DIAGNOSIS — Z955 Presence of coronary angioplasty implant and graft: Secondary | ICD-10-CM | POA: Insufficient documentation

## 2023-11-02 DIAGNOSIS — Z7982 Long term (current) use of aspirin: Secondary | ICD-10-CM | POA: Diagnosis not present

## 2023-11-02 DIAGNOSIS — I10 Essential (primary) hypertension: Secondary | ICD-10-CM | POA: Diagnosis present

## 2023-11-02 DIAGNOSIS — I2510 Atherosclerotic heart disease of native coronary artery without angina pectoris: Secondary | ICD-10-CM

## 2023-11-02 DIAGNOSIS — I25110 Atherosclerotic heart disease of native coronary artery with unstable angina pectoris: Secondary | ICD-10-CM | POA: Diagnosis present

## 2023-11-02 DIAGNOSIS — I252 Old myocardial infarction: Secondary | ICD-10-CM | POA: Diagnosis not present

## 2023-11-02 DIAGNOSIS — Z9861 Coronary angioplasty status: Secondary | ICD-10-CM

## 2023-11-02 DIAGNOSIS — Z79899 Other long term (current) drug therapy: Secondary | ICD-10-CM | POA: Diagnosis not present

## 2023-11-02 DIAGNOSIS — Z7902 Long term (current) use of antithrombotics/antiplatelets: Secondary | ICD-10-CM | POA: Insufficient documentation

## 2023-11-02 DIAGNOSIS — I493 Ventricular premature depolarization: Secondary | ICD-10-CM | POA: Diagnosis not present

## 2023-11-02 DIAGNOSIS — E782 Mixed hyperlipidemia: Secondary | ICD-10-CM | POA: Diagnosis present

## 2023-11-02 HISTORY — PX: LEFT HEART CATH AND CORONARY ANGIOGRAPHY: CATH118249

## 2023-11-02 HISTORY — PX: CORONARY ULTRASOUND/IVUS: CATH118244

## 2023-11-02 HISTORY — PX: CORONARY BALLOON ANGIOPLASTY: CATH118233

## 2023-11-02 LAB — POCT ACTIVATED CLOTTING TIME
Activated Clotting Time: 417 s
Activated Clotting Time: 302 s
Activated Clotting Time: 446 s
Activated Clotting Time: 498 s

## 2023-11-02 LAB — PREGNANCY, URINE: Preg Test, Ur: NEGATIVE

## 2023-11-02 MED ORDER — NITROGLYCERIN 1 MG/10 ML FOR IR/CATH LAB
INTRA_ARTERIAL | Status: DC | PRN
  Administered 2023-11-02 (×3): 200 ug via INTRACORONARY
  Administered 2023-11-02: 250 ug via INTRACORONARY

## 2023-11-02 MED ORDER — HEPARIN SODIUM (PORCINE) 1000 UNIT/ML IJ SOLN
INTRAMUSCULAR | Status: DC | PRN
  Administered 2023-11-02: 6000 [IU] via INTRAVENOUS
  Administered 2023-11-02: 7000 [IU] via INTRAVENOUS

## 2023-11-02 MED ORDER — TIROFIBAN HCL IN NACL 5-0.9 MG/100ML-% IV SOLN
INTRAVENOUS | Status: AC | PRN
  Administered 2023-11-02: .15 ug/kg/min via INTRAVENOUS

## 2023-11-02 MED ORDER — FENTANYL CITRATE (PF) 100 MCG/2ML IJ SOLN
INTRAMUSCULAR | Status: DC | PRN
  Administered 2023-11-02: 25 ug via INTRAVENOUS
  Administered 2023-11-02: 12.5 ug via INTRAVENOUS
  Administered 2023-11-02: 25 ug via INTRAVENOUS
  Administered 2023-11-02: 12.5 ug via INTRAVENOUS
  Administered 2023-11-02: 25 ug via INTRAVENOUS

## 2023-11-02 MED ORDER — SODIUM CHLORIDE 0.9 % IV SOLN: 250.0000 mL | INTRAVENOUS | Status: DC | PRN

## 2023-11-02 MED ORDER — NITROGLYCERIN 1 MG/10 ML FOR IR/CATH LAB
INTRA_ARTERIAL | Status: AC
Start: 2023-11-02 — End: 2023-11-03
  Filled 2023-11-02: qty 10

## 2023-11-02 MED ORDER — TIROFIBAN HCL IN NACL 5-0.9 MG/100ML-% IV SOLN
0.1500 ug/kg/min | INTRAVENOUS | Status: AC
  Administered 2023-11-02: 0.15 ug/kg/min via INTRAVENOUS
  Filled 2023-11-02 (×5): qty 100

## 2023-11-02 MED ORDER — HEPARIN SODIUM (PORCINE) 1000 UNIT/ML IJ SOLN
INTRAMUSCULAR | Status: AC
  Filled 2023-11-02: qty 10

## 2023-11-02 MED ORDER — SODIUM CHLORIDE 0.9% FLUSH: 3.0000 mL | Freq: Two times a day (BID) | INTRAVENOUS | Status: DC

## 2023-11-02 MED ORDER — MIDAZOLAM HCL 2 MG/2ML IJ SOLN
INTRAMUSCULAR | Status: DC | PRN
  Administered 2023-11-02: 1 mg via INTRAVENOUS
  Administered 2023-11-02: .5 mg via INTRAVENOUS
  Administered 2023-11-02: 1 mg via INTRAVENOUS

## 2023-11-02 MED ORDER — SODIUM CHLORIDE 0.9% FLUSH
3.0000 mL | Freq: Two times a day (BID) | INTRAVENOUS | Status: DC
  Administered 2023-11-02 – 2023-11-03 (×2): 3 mL via INTRAVENOUS

## 2023-11-02 MED ORDER — HEPARIN (PORCINE) IN NACL 1000-0.9 UT/500ML-% IV SOLN
INTRAVENOUS | Status: DC | PRN
  Administered 2023-11-02: 500 mL

## 2023-11-02 MED ORDER — ASPIRIN 81 MG PO TBEC: 81.0000 mg | DELAYED_RELEASE_TABLET | Freq: Every day | ORAL | 3 refills | Status: AC

## 2023-11-02 MED ORDER — ASPIRIN 81 MG PO CHEW: 81.0000 mg | CHEWABLE_TABLET | ORAL | Status: DC

## 2023-11-02 MED ORDER — VERAPAMIL HCL 2.5 MG/ML IV SOLN
INTRAVENOUS | Status: AC
  Filled 2023-11-02: qty 2

## 2023-11-02 MED ORDER — NITROGLYCERIN 1 MG/10 ML FOR IR/CATH LAB
INTRA_ARTERIAL | Status: AC
  Filled 2023-11-02: qty 10

## 2023-11-02 MED ORDER — ACETAMINOPHEN 325 MG PO TABS
650.0000 mg | ORAL_TABLET | ORAL | Status: DC | PRN
Start: 2023-11-02 — End: 2023-11-03

## 2023-11-02 MED ORDER — FREE WATER: 500.0000 mL | Freq: Once | Status: DC

## 2023-11-02 MED ORDER — FENTANYL CITRATE (PF) 100 MCG/2ML IJ SOLN
INTRAMUSCULAR | Status: AC
  Filled 2023-11-02: qty 2

## 2023-11-02 MED ORDER — TICAGRELOR 90 MG PO TABS: 90.0000 mg | ORAL_TABLET | ORAL | Status: DC

## 2023-11-02 MED ORDER — SODIUM CHLORIDE 0.9% FLUSH: 3.0000 mL | INTRAVENOUS | Status: DC | PRN

## 2023-11-02 MED ORDER — ONDANSETRON HCL 4 MG/2ML IJ SOLN: 4.0000 mg | Freq: Four times a day (QID) | INTRAMUSCULAR | Status: DC | PRN

## 2023-11-02 MED ORDER — HEPARIN (PORCINE) IN NACL 2-0.9 UNITS/ML
INTRAMUSCULAR | Status: DC | PRN
  Administered 2023-11-02: 10 mL via INTRA_ARTERIAL

## 2023-11-02 MED ORDER — LABETALOL HCL 5 MG/ML IV SOLN: 10.0000 mg | INTRAVENOUS | Status: AC | PRN

## 2023-11-02 MED ORDER — HYDRALAZINE HCL 20 MG/ML IJ SOLN: 10.0000 mg | INTRAMUSCULAR | Status: AC | PRN

## 2023-11-02 MED ORDER — IOHEXOL 350 MG/ML SOLN
INTRAVENOUS | Status: DC | PRN
  Administered 2023-11-02: 170 mL

## 2023-11-02 MED ORDER — SODIUM CHLORIDE 0.9 % IV SOLN
INTRAVENOUS | Status: DC | PRN
  Administered 2023-11-02: 10 mL/h via INTRAVENOUS

## 2023-11-02 MED ORDER — TIROFIBAN HCL IN NACL 5-0.9 MG/100ML-% IV SOLN
INTRAVENOUS | Status: AC
  Filled 2023-11-02: qty 100

## 2023-11-02 MED ORDER — MIDAZOLAM HCL 2 MG/2ML IJ SOLN
INTRAMUSCULAR | Status: AC
  Filled 2023-11-02: qty 2

## 2023-11-02 MED ORDER — FREE WATER
500.0000 mL | Freq: Once | Status: AC
  Administered 2023-11-02: 500 mL via ORAL

## 2023-11-02 MED ORDER — LIDOCAINE HCL (PF) 1 % IJ SOLN
INTRAMUSCULAR | Status: DC | PRN
  Administered 2023-11-02: 2 mL

## 2023-11-02 NOTE — Telephone Encounter (Signed)
 We can do a prescription if needed. It is patient preference.

## 2023-11-02 NOTE — Plan of Care (Signed)

## 2023-11-02 NOTE — Telephone Encounter (Signed)
 Pt's medication aspirin  was sent to pt's pharmacy as a Rx as requested. Confirmation received.

## 2023-11-02 NOTE — Telephone Encounter (Signed)
 Does pt need to purchase aspirin  over the counter or does Dr. Elmira like to prescribe medication? Please clarify

## 2023-11-02 NOTE — Progress Notes (Signed)
 Left message to call back.

## 2023-11-02 NOTE — Interval H&P Note (Signed)
 History and Physical Interval Note:  11/02/2023 11:46 AM  Vallerie GORMAN Rosebush  has presented today for surgery, with the diagnosis of doe.  The various methods of treatment have been discussed with the patient and family. After consideration of risks, benefits and other options for treatment, the patient has consented to  Procedure(s): LEFT HEART CATH AND CORONARY ANGIOGRAPHY (N/A) as a surgical intervention.  The patient's history has been reviewed, patient examined, no change in status, stable for surgery.  I have reviewed the patient's chart and labs.  Questions were answered to the patient's satisfaction.     Trenda Corliss J Cashay Manganelli

## 2023-11-02 NOTE — H&P (Addendum)
 OV 10/25/2023 copied for documentation   Cardiology Office Note:  .   Date:  11/02/2023  ID:  [name], DOB May 27, 1980, MRN [id number] PCP: Ariana Rosina SAILOR, PA  [facility] Providers Cardiologist:  Ariana [name], MD PCP: Ariana Rosina SAILOR, PA  C/C: Chest pain    [name] is a 43 y.o. female with hypertension, hyperlipidemia, prediabetes, obesity, elevated Lp(a), CAD  History of Present Illness  Patient with NSTEMI in 2023, underwent PCI to proximal left circumflex.  There was residual moderate to severe disease that was medically treated.  I last saw the patient in 01/2022.  She has been off most of her medications for last several months.  She is reportedly still taking Brilinta  90 mg twice daily.  Recently, she has had chest pain like someone is squeezing her heart, both at rest and exertion.  Chest pain is worse with exertion like coming up stairs.  1 night, she woke up from sleep with chest pain for which she had to take 3 nitroglycerin  pills.  She also has exertional dyspnea without without chest pain.  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    Vitals:   11/02/23 0837  BP: 116/79  Pulse: 76  Resp: 18  Temp: 97.9 F (36.6 C)  SpO2: 100%      Review of Systems  Cardiovascular:  Positive for chest pain and dyspnea on exertion. Negative for leg swelling, palpitations and syncope.        Studies Reviewed: [name]        EKG 10/25/2023: Sinus rhythm with occasional Premature ventricular complexes When compared with ECG of 26-Nov-2022 06:50, Vent. rate has decreased BY  49 BPM Non-specific change in ST segment in Anterior leads     Coronary intervention 04/22/2021: LM: Normal LAD: Normal Lcx: Large, dominant vessel        Prox 80% eccentric stenosis        Lateral branch off OM1 with ostial 70% stenosis        OM3 ostial 70% stenosis RCA: Nondominant   LVEDP normal   Successful percutaneous  coronary intervention prox LCx        PTCA and stent placement 3.0 X 18 mm Onyx Frontier drug-eluting stent        Post dilatation with 3.25X8 mm [place] balloon at 16 atm   Rest of the moderate residual disease best treated medically   Echocardiogram 04/22/2021: 1. Left ventricular ejection fraction, by estimation, is 55 to 60%. The  left ventricle has normal function. The left ventricle demonstrates  regional wall motion abnormalities (see scoring diagram/findings for  description). Left ventricular diastolic  parameters were normal. There is mild hypokinesis of the left ventricular,  basal inferolateral wall.   2. Right ventricular systolic function is low normal. The right  ventricular size is not well visualized.   3. The mitral valve is normal in structure. No evidence of mitral valve  regurgitation. No evidence of mitral stenosis.   4. The aortic valve is normal in structure. Aortic valve regurgitation is  not visualized. No aortic stenosis is present.   Labs 11/2022: Hb 12.1 Cr 0.9, Na 131   10/2021: Chol 128, TG 118, HDL 61, LDL 44  Physical Exam Vitals and nursing note reviewed.  Constitutional:      General: She is not in acute distress.    Appearance: She is obese.  Neck:     Vascular: No JVD.  Cardiovascular:  Rate and Rhythm: Normal rate and regular rhythm.     Heart sounds: Normal heart sounds. No murmur heard. Pulmonary:     Effort: Pulmonary effort is normal.     Breath sounds: Normal breath sounds. No wheezing or rales.  Musculoskeletal:     Right lower leg: No edema.     Left lower leg: No edema.      VISIT DIAGNOSES:   ICD-10-CM   1. Symptomatic PVCs  I49.3 Pregnancy, urine    Pregnancy, urine       [name] is a 43 y.o. female with hypertension, obesity, prediabetes, mixed hyperlipidemia, elevated lipoprotein (a), CAD, s/p non-STEMI requiring PCI to proximal left circumflex in 04/2021  Assessment & Plan  CAD: S/p PCI to Prox Lcx  severe stenosis, residual severe disease in OM branches. Symptoms of chest pain at both rest and exertion concerning for unstable angina. Currently on Brilinta  90 mg twice daily. Added aspirin  81 mg daily, Crestor  10 mg daily. Refilled Repatha , last taken 2 months ago. Started back on metoprolol  to tartrate 12.5 mg twice daily. Refilled sublingual nitroglycerin . Given patient's symptoms, low normal blood pressure precluding aggressive medical management for angina, recommend coronary angiogram with possible intervention, along with echocardiogram.   I have reviewed the risks, indications, and alternatives to cardiac catheterization, possible angioplasty, and stenting with the patient. Risks include but are not limited to bleeding, infection, vascular injury, stroke, myocardial infection, arrhythmia, kidney injury, radiation-related injury in the case of prolonged fluoroscopy use, emergency cardiac surgery, and death. The patient understands the risks of serious complication is 1-2 in 1000 with diagnostic cardiac cath and 1-2% or less with angioplasty/stenting.    Informed Consent   Shared Decision Making/Informed Consent The risks [stroke (1 in 1000), death (1 in 1000), kidney failure [usually temporary] (1 in 500), bleeding (1 in 200), allergic reaction [possibly serious] (1 in 200)], benefits (diagnostic support and management of coronary artery disease) and alternatives of a cardiac catheterization were discussed in detail with [name] and she is willing to proceed.      Check CBC, BMP, lipid panel, lipoprotein a.  Meds ordered this encounter  Medications   aspirin  chewable tablet 81 mg   free water  500 mL   sodium chloride  flush (NS) 0.9 % injection 3 mL   sodium chloride  flush (NS) 0.9 % injection 3 mL   0.9 %  sodium chloride  infusion   ticagrelor  (BRILINTA ) tablet 90 mg     F/u after cath  Signed, Ariana JINNY [name], MD

## 2023-11-03 ENCOUNTER — Other Ambulatory Visit (HOSPITAL_COMMUNITY): Payer: Self-pay

## 2023-11-03 ENCOUNTER — Encounter (HOSPITAL_COMMUNITY): Payer: Self-pay | Admitting: Cardiology

## 2023-11-03 DIAGNOSIS — Z9861 Coronary angioplasty status: Secondary | ICD-10-CM

## 2023-11-03 DIAGNOSIS — I25110 Atherosclerotic heart disease of native coronary artery with unstable angina pectoris: Secondary | ICD-10-CM | POA: Diagnosis not present

## 2023-11-03 DIAGNOSIS — Z7902 Long term (current) use of antithrombotics/antiplatelets: Secondary | ICD-10-CM | POA: Diagnosis not present

## 2023-11-03 DIAGNOSIS — I493 Ventricular premature depolarization: Secondary | ICD-10-CM | POA: Diagnosis not present

## 2023-11-03 DIAGNOSIS — I252 Old myocardial infarction: Secondary | ICD-10-CM | POA: Diagnosis not present

## 2023-11-03 LAB — CBC
HCT: 33 % — ABNORMAL LOW (ref 36.0–46.0)
Hemoglobin: 10.8 g/dL — ABNORMAL LOW (ref 12.0–15.0)
MCH: 29 pg (ref 26.0–34.0)
MCHC: 32.7 g/dL (ref 30.0–36.0)
MCV: 88.7 fL (ref 80.0–100.0)
Platelets: 322 K/uL (ref 150–400)
RBC: 3.72 MIL/uL — ABNORMAL LOW (ref 3.87–5.11)
RDW: 13.6 % (ref 11.5–15.5)
WBC: 7.3 K/uL (ref 4.0–10.5)
nRBC: 0 % (ref 0.0–0.2)

## 2023-11-03 LAB — BASIC METABOLIC PANEL WITH GFR
Anion gap: 7 (ref 5–15)
BUN: 15 mg/dL (ref 6–20)
CO2: 23 mmol/L (ref 22–32)
Calcium: 9.1 mg/dL (ref 8.9–10.3)
Chloride: 107 mmol/L (ref 98–111)
Creatinine, Ser: 1 mg/dL (ref 0.44–1.00)
GFR, Estimated: >60 mL/min (ref >60)
Glucose, Bld: 119 mg/dL — ABNORMAL HIGH (ref 70–99)
Potassium: 3.9 mmol/L (ref 3.5–5.1)
Sodium: 137 mmol/L (ref 135–145)

## 2023-11-03 MED ORDER — TICAGRELOR 90 MG PO TABS
90.0000 mg | ORAL_TABLET | Freq: Two times a day (BID) | ORAL | 11 refills | Status: DC
  Filled 2023-11-03: qty 60, 30d supply, fill #0

## 2023-11-03 MED ORDER — TICAGRELOR 90 MG PO TABS
90.0000 mg | ORAL_TABLET | Freq: Once | ORAL | Status: AC
  Administered 2023-11-03: 90 mg via ORAL
  Filled 2023-11-03: qty 1

## 2023-11-03 MED ORDER — ASPIRIN 81 MG PO TBEC
81.0000 mg | DELAYED_RELEASE_TABLET | Freq: Once | ORAL | Status: AC
  Administered 2023-11-03: 81 mg via ORAL
  Filled 2023-11-03: qty 1

## 2023-11-03 MED FILL — Nitroglycerin IV Soln 100 MCG/ML in D5W: INTRA_ARTERIAL | Qty: 10 | Status: AC

## 2023-11-03 NOTE — Progress Notes (Signed)
  Progress Note  Patient Name: DEANNAH ROSSI Date of Encounter: 11/03/2023 [facility] Cardiologist: Newman JINNY [name], MD   Interval Summary   Status post drug-coated balloon angioplasty of high-grade in-stent restenosis of left circumflex with rescue of a large first obtuse marginal yesterday.  No acute events overnight, feels well and denies chest pain  Vital Signs Vitals:   11/02/23 1944 11/02/23 2323 11/03/23 0413 11/03/23 0750  BP: 123/82 132/79 126/82 127/78  Pulse: 85 90 97 87  Resp: 20 10 15 20   Temp: 98.1 F (36.7 C) 98.3 F (36.8 C) 98 F (36.7 C) 98.4 F (36.9 C)  TempSrc: Oral Oral Oral Oral  SpO2: 97% 98% 97% 96%  Weight:      Height:        Intake/Output Summary (Last 24 hours) at 11/03/2023 0801 Last data filed at 11/02/2023 2100 Gross per 24 hour  Intake 880.95 ml  Output 500 ml  Net 380.95 ml      11/02/2023    5:03 PM 11/02/2023    8:37 AM 10/25/2023    8:19 AM  Last 3 Weights  Weight (lbs) 284 lb 13.4 oz 280 lb 280 lb  Weight (kg) 129.2 kg 127.007 kg 127.007 kg      Telemetry/ECG  NSR - Personally Reviewed  Physical Exam  GEN: No acute distress.   Neck: No JVD Cardiac: RRR, no murmurs, rubs, or gallops.  Respiratory: Clear to auscultation bilaterally. GI: Soft, nontender, non-distended  MS: No edema; R radial intact  Assessment & Plan  1.  Coronary artery disease/unstable angina: Status post DCB of left circumflex stent complicated by loss of flow and large jailed obtuse marginal rescued both balloon angioplasty with residual ostial stenosis Continue aspirin  81 Continue ticagrelor  90 twice daily Continue DAPT for 6 months then convert to ticagrelor  monotherapy Continue rosuvastatin  20 Continue metoprolol  12.5 mg twice daily Continue at home Repatha  (has been off of this medication for a while) Continue as needed nitroglycerin  2.  Hypertension: Blood pressure is well-controlled today Resume home lisinopril  2.5  mg Continue Lopressor  12.5 mg twice daily  3.  Hyperlipidemia: LDL recently  Continue atorvastatin  20 mg Restarting Repatha  4.  Prediabetes: Hemoglobin A1c of 6 in 2023; consider assessing as outpatient and if elevated consider GLP-1 receptor agonist  D/C today with close hospital follow up     For questions or updates, please contact [facility] Please consult [email or web address] for contact info under       Signed, Marycruz Boehner K Debe Anfinson, MD

## 2023-11-03 NOTE — Progress Notes (Signed)
 CARDIAC REHAB PHASE I   PRE:  Rate/Rhythm: 96 SR       SaO2: 98  MODE:  Ambulation: 120 ft   POST:  Rate/Rhythm: 110 ST       SaO2: 99 RA   Pt ambulated independently in hallway, tolerated well with no CP, SOB or dizziness. Returned to bed with call bell and beside table in reach. Post PTCA education including restrictions, risk factors, exercise guidelines, antiplatelet therapy importance, NTG use, heart healthy diet and CRP2 reviewed. All questions and concerns addressed. Will refer to [facility] for CRP2. Plan for discharge home later today.   [id number]  Vaughn Asberry Hacking, RN BSN 11/03/2023 9:06 AM

## 2023-11-03 NOTE — TOC CM/SW Note (Signed)
 Transition of Care [facility]) - Inpatient Brief Assessment   Patient Details  Name: SYMIAH NOWOTNY MRN: [id number] Date of Birth: February 07, 1981  Transition of Care [facility]) CM/SW Contact:    Lauraine FORBES Saa, LCSWA Phone Number: 11/03/2023, 9:11 AM   Clinical Narrative:  9:11 AM Per chart review, patient resides at home with child(ren) and significant other. Patient has a PCP and insurance. Patient does not have SNF/HH/DME history. Patient's preferred pharmacy's are [facility] and [facility] [phone] [facility]. No TOC needs were identified at this time. TOC will continue to follow and be available to assist.  Transition of Care Asessment: Insurance and Status: Insurance coverage has been reviewed Patient has primary care physician: Yes Home environment has been reviewed: Private Residence Prior level of function:: N/A Prior/Current Home Services: No current home services Social Drivers of Health Review: SDOH reviewed no interventions necessary Readmission risk has been reviewed: Yes (Currently Outpatient in Bed Status) Transition of care needs: no transition of care needs at this time

## 2023-11-03 NOTE — Discharge Summary (Addendum)
 Discharge Summary   Patient ID: [name] MRN: [id number]; DOB: Dec 09, 1980  Admit date: 11/02/2023 Discharge date: 11/03/2023  PCP:  [name], PA   [facility] Providers Cardiologist:  Newman JINNY [name], MD     Discharge Diagnoses  Principal Problem:   Post PTCA Active Problems:   Essential hypertension, benign   Mixed hyperlipidemia   Diagnostic Studies/Procedures   Coronary angiography & intervention 11/02/2023: LM: Normal LAD: No significant disease Lcx: Large, dominant         Prox stent with 80% restenosis         Large trifurcating OM1 with ostial 70% stenosis RCA: Small, non-dominant vessel. No significant disease   LVEDP 23 mmHg   Successful percutaneous coronary intervention prox Lcx/OM      Intravascular ultrasound (IVUS)      DCB PTCA prox Lcx 3.0 X 30 mm Agent drug coated balloon      PTCA OM1 with 3.5 X 8 mm balloon      Suspicion for small thrombus at ostium of OM1.  Given TIMI-3 flow, no chest pain, no EKG changes, and pressures of DCB and left circumflex stent, I wanted to avoid further manipulation of the vessel.  Therefore, I did not perform any further intervention, including IVUS.  I will place the patient on 6 hours of Aggrastat  and monitor overnight.   Patient was previously on aspirin  and Brilinta .  I will continue the same for another 1 year. Will make sure that patient's Repatha  is refilled, and emphasize compliance with   Unusually delayed procedure time due to use of multiple wires, abrupt vessel closure necessitating additional treatment to OM1 vessel, and eventually was after coated balloon.   Manish JINNY [name], MD  _____________   History of Present Illness   [name] is a 43 y.o. female with past medical history of CAD status post prior PCI to circumflex, hypertension, hyperlipidemia, prediabetes who was seen in the office recently with Dr. [name] and reported she had been off of most of her  medications but was still taking her Brilinta .  Had recently been having chest pain which was squeezing in nature.  Had an episode where she was awoken from sleep with pain and took several sublingual nitroglycerin .  Given this she was set up for outpatient cardiac catheterization.   Hospital Course    Underwent cardiac catheterization on 8/27 with in-stent restenosis of proximal circumflex treated with drug-coated balloon angioplasty.  Suspicion for small thrombus at the ostium of the OM therefore further manipulation of the vessel was deferred.  Placed on IV Aggrastat  for 6 hours overnight without complications.  Recommendations to continue on aspirin /Brilinta  for at least 1 year. Continued on home medications. Seen by cardiac rehab.   Did the patient have an acute coronary syndrome (MI, NSTEMI, STEMI, etc) this admission?:  No                               Did the patient have a percutaneous coronary intervention (stent / angioplasty)?:  Yes.     Cath/PCI Registry Performance & Quality Measures: Aspirin  prescribed? - Yes ADP Receptor Inhibitor (Plavix/Clopidogrel, Brilinta /Ticagrelor  or Effient/Prasugrel) prescribed (includes medically managed patients)? - Yes High Intensity Statin (Lipitor  40-80mg  or Crestor  20-40mg ) prescribed? - Yes For EF <40%, was ACEI/ARB prescribed? - Not Applicable (EF >/= 40%) For EF <40%, Aldosterone Antagonist (Spironolactone or Eplerenone) prescribed? - Not Applicable (EF >/= 40%) Cardiac Rehab  Phase II ordered? - Yes       The patient will be scheduled for a TOC follow up appointment in 10-14 days.  A message has been sent to the [facility] and Scheduling Pool at the office where the patient should be seen for follow up.  _____________  Discharge Vitals Blood pressure 127/78, pulse 87, temperature 98.4 F (36.9 C), temperature source Oral, resp. rate 20, height 5' 3 (1.6 m), weight 129.2 kg, SpO2 96%.  Filed Weights   11/02/23 0837 11/02/23 1703  Weight:  127 kg 129.2 kg    Labs & Radiologic Studies  CBC Recent Labs    11/03/23 0253  WBC 7.3  HGB 10.8*  HCT 33.0*  MCV 88.7  PLT 322   Basic Metabolic Panel Recent Labs    91/71/74 0253  NA 137  K 3.9  CL 107  CO2 23  GLUCOSE 119*  BUN 15  CREATININE 1.00  CALCIUM  9.1   Liver Function Tests No results for input(s): AST, ALT, ALKPHOS, BILITOT, PROT, ALBUMIN in the last 72 hours. No results for input(s): LIPASE, AMYLASE in the last 72 hours. High Sensitivity Troponin:   No results for input(s): TROPONINIHS in the last 720 hours.  No results for input(s): TRNPT in the last 720 hours.  BNP Invalid input(s): POCBNP No results for input(s): PROBNP in the last 72 hours.  No results for input(s): BNP in the last 72 hours.  D-Dimer No results for input(s): DDIMER in the last 72 hours. Hemoglobin A1C No results for input(s): HGBA1C in the last 72 hours. Fasting Lipid Panel No results for input(s): CHOL, HDL, LDLCALC, TRIG, CHOLHDL, LDLDIRECT in the last 72 hours. Lipoprotein (a)  Date/Time Value Ref Range Status  10/27/2023 03:47 PM 199.0 (H) <75.0 nmol/L Final    Comment:    Note:  Values greater than or equal to 75.0 nmol/L may        indicate an independent risk factor for CHD,        but must be evaluated with caution when applied        to non-Caucasian populations due to the        influence of genetic factors on Lp(a) across        ethnicities.     Thyroid Function Tests No results for input(s): TSH, T4TOTAL, T3FREE, THYROIDAB in the last 72 hours.  Invalid input(s): FREET3 _____________  CARDIAC CATHETERIZATION Result Date: 11/02/2023 Images from the original result were not included. Coronary angiography & intervention 11/02/2023: LM: Normal LAD: No significant disease Lcx: Large, dominant         Prox stent with 80% restenosis         Large trifurcating OM1 with ostial 70% stenosis RCA: Small, non-dominant  vessel. No significant disease LVEDP 23 mmHg Successful percutaneous coronary intervention prox Lcx/OM      Intravascular ultrasound (IVUS)      DCB PTCA prox Lcx 3.0 X 30 mm Agent drug coated balloon      PTCA OM1 with 3.5 X 8 mm balloon Suspicion for small thrombus at ostium of OM1.  Given TIMI-3 flow, no chest pain, no EKG changes, and pressures of DCB and left circumflex stent, I wanted to avoid further manipulation of the vessel.  Therefore, I did not perform any further intervention, including IVUS.  I will place the patient on 6 hours of Aggrastat  and monitor overnight. Patient was previously on aspirin  and Brilinta .  I will continue the same for another 1  year. Will make sure that patient's Repatha  is refilled, and emphasize compliance with Unusually delayed procedure time due to use of multiple wires, abrupt vessel closure necessitating additional treatment to OM1 vessel, and eventually was after coated balloon. Newman JINNY [name], MD   ECHOCARDIOGRAM COMPLETE Result Date: 10/28/2023    ECHOCARDIOGRAM REPORT   Patient Name:   ERYCA BOLTE Date of Exam: 10/28/2023 Medical Rec #:  [id number]         Height:       63.0 in Accession #:    [id number]        Weight:       280.0 lb Date of Birth:  10-17-1980         BSA:          2.231 m Patient Age:    43 years          BP:           102/72 mmHg Patient Gender: F                 HR:           80 bpm. Exam Location:  [address] Procedure: 2D Echo, Cardiac Doppler, Color Doppler and 3D Echo (Both Spectral            and Color Flow Doppler were utilized during procedure). Indications:    R07.2 Precordial pain; R06.9 DOE  History:        Patient has prior history of Echocardiogram examinations, most                 recent 04/22/2021. Coronary Stent Intervention; Risk                 Factors:Hypertension.  Sonographer:    Augustin Seals RDCS Referring Phys: [id number] [facility] J PATWARDHAN  Sonographer Comments: Global longitudinal strain was attempted.  IMPRESSIONS  1. Left ventricular ejection fraction, by estimation, is 55 to 60%. The left ventricle has normal function. The left ventricle has no regional wall motion abnormalities. Left ventricular diastolic parameters were normal.  2. Right ventricular systolic function is normal. The right ventricular size is normal. Tricuspid regurgitation signal is inadequate for assessing PA pressure.  3. The mitral valve is grossly normal. Trivial mitral valve regurgitation. No evidence of mitral stenosis.  4. The aortic valve is tricuspid. Aortic valve regurgitation is mild. No aortic stenosis is present.  5. The inferior vena cava is normal in size with greater than 50% respiratory variability, suggesting right atrial pressure of 3 mmHg. FINDINGS  Left Ventricle: Left ventricular ejection fraction, by estimation, is 55 to 60%. The left ventricle has normal function. The left ventricle has no regional wall motion abnormalities. 3D ejection fraction reviewed and evaluated as part of the interpretation. Alternate measurement of EF is felt to be most reflective of LV function. The left ventricular internal cavity size was normal in size. There is no left ventricular hypertrophy. Left ventricular diastolic parameters were normal. Right Ventricle: The right ventricular size is normal. No increase in right ventricular wall thickness. Right ventricular systolic function is normal. Tricuspid regurgitation signal is inadequate for assessing PA pressure. Left Atrium: Left atrial size was normal in size. Right Atrium: Right atrial size was normal in size. Pericardium: There is no evidence of pericardial effusion. Mitral Valve: The mitral valve is grossly normal. Trivial mitral valve regurgitation. No evidence of mitral valve stenosis. Tricuspid Valve: The tricuspid valve is grossly normal. Tricuspid valve regurgitation is trivial. No evidence of  tricuspid stenosis. Aortic Valve: The aortic valve is tricuspid. Aortic valve  regurgitation is mild. No aortic stenosis is present. Pulmonic Valve: The pulmonic valve was grossly normal. Pulmonic valve regurgitation is not visualized. No evidence of pulmonic stenosis. Aorta: The aortic root and ascending aorta are structurally normal, with no evidence of dilitation. Venous: The right lower pulmonary vein is normal. The inferior vena cava is normal in size with greater than 50% respiratory variability, suggesting right atrial pressure of 3 mmHg. IAS/Shunts: The atrial septum is grossly normal. Additional Comments: 3D was performed not requiring image post processing on an independent workstation and was indeterminate.  LEFT VENTRICLE PLAX 2D LVIDd:         4.50 cm   Diastology LVIDs:         3.20 cm   LV e' medial:    10.20 cm/s LV PW:         1.10 cm   LV E/e' medial:  9.7 LV IVS:        1.10 cm   LV e' lateral:   13.80 cm/s LVOT diam:     2.00 cm   LV E/e' lateral: 7.1 LV SV:         69 LV SV Index:   31 LVOT Area:     3.14 cm                           3D Volume EF:                          3D EF:        61 %                          LV EDV:       179 ml                          LV ESV:       70 ml                          LV SV:        109 ml RIGHT VENTRICLE             IVC RV Basal diam:  4.00 cm     IVC diam: 2.00 cm RV Mid diam:    2.80 cm RV S prime:     13.40 cm/s TAPSE (M-mode): 2.6 cm LEFT ATRIUM             Index        RIGHT ATRIUM           Index LA diam:        3.80 cm 1.70 cm/m   RA Area:     15.60 cm LA Vol (A2C):   32.5 ml 14.57 ml/m  RA Volume:   39.50 ml  17.71 ml/m LA Vol (A4C):   48.9 ml 21.92 ml/m LA Biplane Vol: 39.7 ml 17.80 ml/m  AORTIC VALVE LVOT Vmax:   104.00 cm/s LVOT Vmean:  72.800 cm/s LVOT VTI:    0.219 m  AORTA Ao Root diam: 3.10 cm Ao Asc diam:  3.20 cm MITRAL VALVE MV Area (PHT): 4.36 cm    SHUNTS MV Decel Time: 174 msec    Systemic VTI:  0.22 m MV E velocity:  98.50 cm/s  Systemic Diam: 2.00 cm MV A velocity: 67.10 cm/s MV E/A ratio:  1.47 Darryle Decent MD Electronically signed by Darryle Decent MD Signature Date/Time: 10/28/2023/5:27:37 PM    Final     Disposition Pt is being discharged home today in good condition.  Follow-up Plans & Appointments  Discharge Instructions     Amb Referral to Cardiac Rehabilitation   Complete by: As directed    Diagnosis: PTCA   After initial evaluation and assessments completed: Virtual Based Care may be provided alone or in conjunction with Phase 2 Cardiac Rehab based on patient barriers.: Yes   Intensive Cardiac Rehabilitation (ICR) [facility] location only OR Traditional Cardiac Rehabilitation (TCR) *If criteria for ICR are not met will enroll in TCR [facility] only): Yes   Call MD for:  difficulty breathing, headache or visual disturbances   Complete by: As directed    Call MD for:  persistant dizziness or light-headedness   Complete by: As directed    Call MD for:  redness, tenderness, or signs of infection (pain, swelling, redness, odor or green/yellow discharge around incision site)   Complete by: As directed    Diet - low sodium heart healthy   Complete by: As directed    Discharge instructions   Complete by: As directed    Radial Site Care Refer to this sheet in the next few weeks. These instructions provide you with information on caring for yourself after your procedure. Your caregiver may also give you more specific instructions. Your treatment has been planned according to current medical practices, but problems sometimes occur. Call your caregiver if you have any problems or questions after your procedure. HOME CARE INSTRUCTIONS You may shower the day after the procedure. Remove the bandage (dressing) and gently wash the site with plain soap and water . Gently pat the site dry.  Do not apply powder or lotion to the site.  Do not submerge the affected site in water  for 3 to 5 days.  Inspect the site at least twice daily.  Do not flex or bend the affected arm for 24 hours.  No lifting over 5  pounds (2.3 kg) for 5 days after your procedure.  Do not drive home if you are discharged the same day of the procedure. Have someone else drive you.  You may drive 24 hours after the procedure unless otherwise instructed by your caregiver.  What to expect: Any bruising will usually fade within 1 to 2 weeks.  Blood that collects in the tissue (hematoma) may be painful to the touch. It should usually decrease in size and tenderness within 1 to 2 weeks.  SEEK IMMEDIATE MEDICAL CARE IF: You have unusual pain at the radial site.  You have redness, warmth, swelling, or pain at the radial site.  You have drainage (other than a small amount of blood on the dressing).  You have chills.  You have a fever or persistent symptoms for more than 72 hours.  You have a fever and your symptoms suddenly get worse.  Your arm becomes pale, cool, tingly, or numb.  You have heavy bleeding from the site. Hold pressure on the site.   PLEASE DO NOT MISS ANY DOSES OF YOUR BRILINTA !!!!! Also keep a log of you blood pressures and bring back to your follow up appt. Please call the office with any questions.   Patients taking blood thinners should generally stay away from medicines like ibuprofen , Advil , Motrin , naproxen , and Aleve  due to risk of stomach  bleeding. You may take Tylenol  as directed or talk to your primary doctor about alternatives.   PLEASE ENSURE THAT YOU DO NOT RUN OUT OF YOUR BRILINTA . This medication is very important to remain on for at least one year. IF you have issues obtaining this medication due to cost please CALL the office 3-5 business days prior to running out in order to prevent missing doses of this medication.   Increase activity slowly   Complete by: As directed        Discharge Medications Allergies as of 11/03/2023   No Known Allergies      Medication List     STOP taking these medications    diltiazem  60 MG 12 hr capsule Commonly known as: CARDIZEM  SR       TAKE  these medications    ARIPiprazole 5 MG tablet Commonly known as: ABILIFY Take 5 mg by mouth at bedtime.   aspirin  EC 81 MG tablet Take 1 tablet (81 mg total) by mouth daily. Swallow whole.   Brilinta  90 MG Tabs tablet Generic drug: ticagrelor  Take 1 tablet (90 mg total) by mouth 2 (two) times daily.   fluticasone 50 MCG/ACT nasal spray Commonly known as: FLONASE Place 2 sprays into both nostrils daily as needed for allergies.   linaclotide 72 MCG capsule Commonly known as: LINZESS Take 72 mcg by mouth daily as needed (for constipation).   lisinopril  2.5 MG tablet Commonly known as: ZESTRIL  Take 1 tablet (2.5 mg total) by mouth daily.   metoCLOPramide  10 MG tablet Commonly known as: REGLAN  Take 10 mg by mouth every 8 (eight) hours as needed for nausea.   metoprolol  tartrate 25 MG tablet Commonly known as: LOPRESSOR  Take 1/2 tablet (12.5 mg total) by mouth 2 (two) times daily.   multivitamin capsule Take 1 capsule by mouth daily with breakfast.   nitroGLYCERIN  0.4 MG SL tablet Commonly known as: NITROSTAT  Place 1 tablet (0.4 mg total) under the tongue every 5 (five) minutes as needed.   nortriptyline 50 MG capsule Commonly known as: PAMELOR Take 50 mg by mouth at bedtime.   omeprazole  40 MG capsule Commonly known as: PRILOSEC Take 40 mg by mouth daily.   promethazine  25 MG tablet Commonly known as: PHENERGAN  Take 1 tablet (25 mg total) by mouth every 6 (six) hours as needed for nausea or vomiting.   Repatha  SureClick 140 MG/ML Soaj Generic drug: Evolocumab  Inject 140 mg into the skin every 14 (fourteen) days.   rosuvastatin  20 MG tablet Commonly known as: CRESTOR  Take 1 tablet (20 mg total) by mouth daily.   sertraline  25 MG tablet Commonly known as: ZOLOFT  Take 25 mg by mouth at bedtime.   Ventolin HFA 108 (90 Base) MCG/ACT inhaler Generic drug: albuterol Inhale 2 puffs into the lungs every 6 (six) hours as needed for wheezing or shortness of breath.    Visine 0.025-0.3 % ophthalmic solution Generic drug: naphazoline-pheniramine Place 1 drop into both eyes daily as needed for eye irritation.         Outstanding Labs/Studies  N/a   Duration of Discharge Encounter: APP Time: 15 minutes   Signed, Manuelita Rummer, NP 11/03/2023, 11:14 AM  ATTENDING ATTESTATION:  After conducting a review of all available clinical information with the care team, interviewing the patient, and performing a physical exam, I agree with the findings and plan described in this note.   GEN: No acute distress.   Neck: No JVD Cardiac: RRR, no murmurs, rubs, or gallops.  Respiratory: Clear  to auscultation bilaterally. GI: Soft, nontender, non-distended  MS: No edema; R radial intact    1.  Coronary artery disease/unstable angina: Status post DCB of left circumflex stent complicated by loss of flow and large jailed obtuse marginal rescued both balloon angioplasty with residual ostial stenosis Continue aspirin  81 Continue ticagrelor  90 twice daily Continue DAPT for 6 months then convert to ticagrelor  monotherapy Continue rosuvastatin  20 Continue metoprolol  12.5 mg twice daily Continue at home Repatha  (has been off of this medication for a while) Continue as needed nitroglycerin  2.  Hypertension: Blood pressure is well-controlled today Resume home lisinopril  2.5 mg Continue Lopressor  12.5 mg twice daily         3.  Hyperlipidemia: LDL recently  Continue atorvastatin  20 mg Restarting Repatha  4.  Prediabetes: Hemoglobin A1c of 6 in 2023; consider assessing as outpatient and if elevated consider GLP-1 receptor agonist   D/C today with close hospital follow up  APP discharge time:15 MD discharge time:18  Lurena Red, MD Pager [phone]

## 2023-11-08 ENCOUNTER — Encounter (HOSPITAL_BASED_OUTPATIENT_CLINIC_OR_DEPARTMENT_OTHER): Payer: Self-pay

## 2023-11-08 NOTE — Progress Notes (Unsigned)
 Cardiology Office Note    Patient Name: [name] Date of Encounter: 11/09/2023  Primary Care Provider:  [name], PA Primary Cardiologist:  Newman JINNY [name], MD Primary Electrophysiologist: None   Past Medical History    Past Medical History:  Diagnosis Date   Acid reflux    Alcohol  abuse    Anemia    Depression    GERD (gastroesophageal reflux disease)    H/O suicide attempt    Hypertension    Vaginal Pap smear, abnormal     History of Present Illness  [name] is a 43 y.o. female with a PMH of CAD s/p NSTEMI 2023 with PCI to proximal left circumflex and PTCA of proximal left circumflex/OM on 11/02/2023 HLD, HTN who presents today for post PCI follow-up.  [name] has a past medical history significant for coronary artery disease and NSTEMI in 04/2021 with LHC performed and PTCA and PCI/DES to left circumflex.  She was started on DAPT with aspirin  and Brilinta .  She was seen in follow-up on 08/07/2021 with complaint of intermittent chest pain and underwent Lexiscan  Myoview that was low risk.  She continued to have complaint of chest discomfort and had Imdur  30 mg added to current regimen with plan for more invasive strategies if pain persisted.  She was lost to follow-up until being seen on 10/25/2023 with ongoing chest discomfort.  She reported discomfort worse with exertion and notes that she woke up from her sleep and took 3 nitroglycerin  for relief.  She was scheduled for outpatient LHC which was performed on 11/02/2023 and showed high-grade 80% in-stent restenosis of the left circumflex with a jailed OM with 70% stenosis and treated with balloon angioplasty.  There was suspicion for small thrombus at ostium of OM and patient was placed on Aggrastat  x 6 hours following procedure.  She was continued on DAPT with ASA and Brilinta  x 1 year.  [name] presents today with her significant other for posthospital follow-up.  She reports occasional episodes of  chest discomfort similar to her previous discomfort, which occurred earlier this morning while dropping her daughter off at school. The pain is sometimes associated with activity such as walking fast or climbing stairs and is accompanied by shortness of breath. The discomfort typically resolves within a few seconds and has not required the use of nitroglycerin . Her current medications include Brilinta , which she is tolerating well, and she is awaiting refills for Repatha  and diltiazem . She has not taken Repatha  for a couple of months due to a hold placed pending pre authorization.  There was an issue with the diltiazem  dosage, as the pharmacy refilled the wrong dose (60 mg instead of 120 mg). She has experienced palpitations and skipped beats since not being on the correct dose of diltiazem , which she describes as lasting a few seconds and sometimes causing discomfort. She is also on metoprolol  12.5 mg, lisinopril  2.5 mg, and Crestor . She is currently studying health and human services and has a degree in medical administrative assistance. She has been active post-procedure, engaging in activities such as grocery shopping and laundry. Patient denies dyspnea, PND, orthopnea, nausea, vomiting, dizziness, syncope, edema, weight gain, or early satiety.  Discussed the use of AI scribe software for clinical note transcription with the patient, who gave verbal consent to proceed.  History of Present Illness   Review of Systems  Please see the history of present illness.    All other systems reviewed and are otherwise negative except as  noted above.  Physical Exam    Wt Readings from Last 3 Encounters:  11/09/23 285 lb 12.8 oz (129.6 kg)  11/02/23 284 lb 13.4 oz (129.2 kg)  10/25/23 280 lb (127 kg)   VS: Vitals:   11/09/23 1320  BP: 106/72  Pulse: (!) 119  Resp: 14  SpO2: 98%  ,Body mass index is 50.63 kg/m. GEN: Well nourished, well developed in no acute distress Neck: No JVD; No carotid  bruits Pulmonary: Clear to auscultation without rales, wheezing or rhonchi  Cardiovascular: Tachycardia normal S1. Normal S2.  And right radial site clean dry and intact with no hematoma or ecchymosis present.  Murmurs: There is no murmur.  ABDOMEN: Soft, non-tender, non-distended EXTREMITIES:  No edema; No deformity   EKG/LABS/ Recent Cardiac Studies   ECG personally reviewed by me today -none completed today  Risk Assessment/Calculations:     Lab Results  Component Value Date   WBC 7.3 11/03/2023   HGB 10.8 (L) 11/03/2023   HCT 33.0 (L) 11/03/2023   MCV 88.7 11/03/2023   PLT 322 11/03/2023   Lab Results  Component Value Date   CREATININE 1.00 11/03/2023   BUN 15 11/03/2023   NA 137 11/03/2023   K 3.9 11/03/2023   CL 107 11/03/2023   CO2 23 11/03/2023   Lab Results  Component Value Date   CHOL 169 10/27/2023   HDL 55 10/27/2023   LDLCALC 94 10/27/2023   LDLDIRECT 172.9 (H) 04/23/2021   TRIG 114 10/27/2023   CHOLHDL 3.1 10/27/2023    Lab Results  Component Value Date   HGBA1C 6.0 (H) 04/22/2021   Assessment & Plan    Assessment & Plan  1.  History of CAD: -s/p NSTEMI 2023 with PCI/DES to proximal left circumflex and PTCA of proximal left circumflex/OM on 11/02/2023  -Recommendation to continue ASA and Brilinta  x 1 year - Today patient reports fleeting chest discomfort and has not required nitroglycerin  for relief. -Continue Brilinta  90 mg twice daily and aspirin  81 mg for one year. - Order echocardiogram. - Proceed with cardiac rehab. - Advise nitroglycerin  for chest pain if needed. - Check CBC today  2.  Essential hypertension: - Patient blood pressure today was stable at 106/72 - Continue lisinopril  2.5 mg daily, metoprolol  12.5 mg twice daily  3.  Hyperlipidemia: - LDL levels elevated to 94 mg/dL. Repatha  not refilled post-procedure. Emphasized statin therapy and lipid control importance. - Check on Repatha  refill status and ensure it is filled. -  Continue Crestor . - Monitor lipid levels.  4.  Palpitations:  -Heart rate elevated to 119 bpm. Discussed rate control importance. Swelling could be a side effect of diltiazem . - Refill diltiazem  120 mg extended-release and send to pharmacy. - Advise to double current 60 mg dose until new prescription is filled. - Consider Holter monitor if symptoms continue. - Monitor for swelling and report if it worsens with increased dose of Cardizem     Cardiac Rehabilitation Eligibility Assessment  The patient is ready to start cardiac rehabilitation from a cardiac standpoint.     Disposition: Follow-up with Newman JINNY [name], MD or APP in 3 months    Signed, Wyn Raddle, Jackee Shove, NP 11/09/2023, 2:31 PM [facility]

## 2023-11-09 ENCOUNTER — Ambulatory Visit: Payer: MEDICAID | Attending: Nurse Practitioner | Admitting: Nurse Practitioner

## 2023-11-09 ENCOUNTER — Encounter: Payer: Self-pay | Admitting: Nurse Practitioner

## 2023-11-09 ENCOUNTER — Other Ambulatory Visit (HOSPITAL_COMMUNITY): Payer: Self-pay

## 2023-11-09 ENCOUNTER — Other Ambulatory Visit: Payer: Self-pay

## 2023-11-09 ENCOUNTER — Telehealth (HOSPITAL_COMMUNITY): Payer: Self-pay | Admitting: *Deleted

## 2023-11-09 ENCOUNTER — Telehealth: Payer: Self-pay | Admitting: Pharmacy Technician

## 2023-11-09 VITALS — BP 106/72 | HR 119 | Resp 14 | Ht 63.0 in | Wt 285.8 lb

## 2023-11-09 DIAGNOSIS — I10 Essential (primary) hypertension: Secondary | ICD-10-CM | POA: Diagnosis not present

## 2023-11-09 DIAGNOSIS — I25110 Atherosclerotic heart disease of native coronary artery with unstable angina pectoris: Secondary | ICD-10-CM | POA: Insufficient documentation

## 2023-11-09 DIAGNOSIS — R7303 Prediabetes: Secondary | ICD-10-CM | POA: Diagnosis not present

## 2023-11-09 DIAGNOSIS — E782 Mixed hyperlipidemia: Secondary | ICD-10-CM | POA: Diagnosis present

## 2023-11-09 MED ORDER — DILTIAZEM HCL ER COATED BEADS 120 MG PO CP24: 120.0000 mg | ORAL_CAPSULE | Freq: Every day | ORAL | 2 refills | Status: DC

## 2023-11-09 NOTE — Telephone Encounter (Signed)
 Received referral for this pt to participate in Cardiac rehab phase II with the diagnosis of 11/02/23 PTCA CX, OM1.  Left message requesting call back.  Contact information provided. Saturnino Bernett PEAK, BSN Cardiac and Emergency planning/management officer

## 2023-11-09 NOTE — Patient Instructions (Signed)
 Medication Instructions:  START Cardizem  120mg  Take 1 tablet once a day  *If you need a refill on your cardiac medications before your next appointment, please call your pharmacy*  Lab Work: None ordered If you have labs (blood work) drawn today and your tests are completely normal, you will receive your results only by: MyChart Message (if you have MyChart) OR A paper copy in the mail If you have any lab test that is abnormal or we need to change your treatment, we will call you to review the results.  Testing/Procedures: None ordered  Follow-Up: At [facility], you and your health needs are our priority.  As part of our continuing mission to provide you with exceptional heart care, our providers are all part of one team.  This team includes your primary Cardiologist (physician) and Advanced Practice Providers or APPs (Physician Assistants and Nurse Practitioners) who all work together to provide you with the care you need, when you need it.  Your next appointment:   3 month(s)  Provider:   Newman JINNY [name], MD or ANY APP   We recommend signing up for the patient portal called MyChart.  Sign up information is provided on this After Visit Summary.  MyChart is used to connect with patients for Virtual Visits (Telemedicine).  Patients are able to view lab/test results, encounter notes, upcoming appointments, etc.  Non-urgent messages can be sent to your provider as well.   To learn more about what you can do with MyChart, go to [email or web address].   Other Instructions

## 2023-11-09 NOTE — Telephone Encounter (Signed)
 Pharmacy Patient Advocate Encounter   Received notification from Patient Pharmacy that prior authorization for repatha  is required/requested.   Insurance verification completed.   The patient is insured through [place] [employer]ICA[employer] .   Per test claim: PA required; PA submitted to above mentioned insurance via Latent Key/confirmation #/EOC [id number] Status is pending

## 2023-11-09 NOTE — Addendum Note (Signed)
 Addended by: DRENA MARTINIS, Skylor Schnapp L on: 11/09/2023 03:16 PM   Modules accepted: Orders

## 2023-11-10 ENCOUNTER — Other Ambulatory Visit (HOSPITAL_COMMUNITY): Payer: Self-pay

## 2023-11-10 ENCOUNTER — Other Ambulatory Visit (HOSPITAL_COMMUNITY): Payer: MEDICAID

## 2023-11-10 NOTE — Telephone Encounter (Signed)
 Pharmacy Patient Advocate Encounter  Received notification from [facility] MEDICAID that Prior Authorization for repatha  has been APPROVED from 11/10/23 to 11/09/24   PA #/Case ID/Reference #: [id number]

## 2023-11-15 ENCOUNTER — Encounter (HOSPITAL_COMMUNITY): Payer: Self-pay

## 2023-11-15 ENCOUNTER — Telehealth (HOSPITAL_COMMUNITY): Payer: Self-pay

## 2023-11-15 NOTE — Telephone Encounter (Signed)
 Attempted to call patient to check interest in cardiac rehab- no answer, left messages on cell and home phone listed numbers. Mailed letter.  Faxing Medicaid form to Dr. Gilda office.

## 2023-11-18 LAB — CBC WITH DIFFERENTIAL/PLATELET
Basophils Absolute: 0 x10E3/uL (ref 0.0–0.2)
Basos: 1 %
EOS (ABSOLUTE): 0.1 x10E3/uL (ref 0.0–0.4)
Eos: 2 %
Hematocrit: 33.1 % — ABNORMAL LOW (ref 34.0–46.6)
Hemoglobin: 10.7 g/dL — ABNORMAL LOW (ref 11.1–15.9)
Immature Grans (Abs): 0 x10E3/uL (ref 0.0–0.1)
Immature Granulocytes: 0 %
Lymphocytes Absolute: 2.2 x10E3/uL (ref 0.7–3.1)
Lymphs: 36 %
MCH: 29 pg (ref 26.6–33.0)
MCHC: 32.3 g/dL (ref 31.5–35.7)
MCV: 90 fL (ref 79–97)
Monocytes Absolute: 0.4 x10E3/uL (ref 0.1–0.9)
Monocytes: 6 %
Neutrophils Absolute: 3.3 x10E3/uL (ref 1.4–7.0)
Neutrophils: 55 %
Platelets: 265 x10E3/uL (ref 150–450)
RBC: 3.69 x10E6/uL — ABNORMAL LOW (ref 3.77–5.28)
RDW: 13.4 % (ref 11.7–15.4)
WBC: 6 x10E3/uL (ref 3.4–10.8)

## 2023-11-19 ENCOUNTER — Ambulatory Visit: Payer: Self-pay | Admitting: Nurse Practitioner

## 2023-11-21 ENCOUNTER — Other Ambulatory Visit (HOSPITAL_COMMUNITY): Payer: Self-pay

## 2023-11-22 ENCOUNTER — Telehealth (HOSPITAL_COMMUNITY): Payer: Self-pay

## 2023-11-22 NOTE — Telephone Encounter (Signed)
 Patient returned call confirming interest in cardiac rehab. Informed patient we are waiting for Medicaid form to be returned from Dr. Elmira and will call her back once it is completed.

## 2023-12-07 ENCOUNTER — Telehealth (HOSPITAL_COMMUNITY): Payer: Self-pay

## 2023-12-07 NOTE — Telephone Encounter (Signed)
 Patient has benefits through [facility], we are unable to verify benefits through them beyond that they are in-network. Patient has been made aware it is her responsibility to confirm her benefits, we will assume TCR 36 as we cannot verify visit limits.

## 2023-12-07 NOTE — Telephone Encounter (Signed)
 Called patient to see if she was interested in participating in the Cardiac Rehab Program. Patient will come in for orientation on 10/23 and will attend the 10:15 exercise class.  Pensions consultant.   Patient is aware she is responsible for checking her benefits through [facility] as we have no card on file and no number to contact them.

## 2023-12-28 ENCOUNTER — Telehealth (HOSPITAL_COMMUNITY): Payer: Self-pay

## 2023-12-28 NOTE — Telephone Encounter (Signed)
 Attempted to confirm cardiac rehab orientation date of 12/29/23 @ 1030.

## 2023-12-29 ENCOUNTER — Ambulatory Visit (INDEPENDENT_AMBULATORY_CARE_PROVIDER_SITE_OTHER): Payer: MEDICAID | Admitting: Student

## 2023-12-29 ENCOUNTER — Encounter: Payer: Self-pay | Admitting: Student

## 2023-12-29 ENCOUNTER — Encounter (HOSPITAL_COMMUNITY): Payer: Self-pay

## 2023-12-29 ENCOUNTER — Telehealth (HOSPITAL_COMMUNITY): Payer: Self-pay | Admitting: *Deleted

## 2023-12-29 ENCOUNTER — Other Ambulatory Visit: Payer: Self-pay | Admitting: *Deleted

## 2023-12-29 ENCOUNTER — Ambulatory Visit (INDEPENDENT_AMBULATORY_CARE_PROVIDER_SITE_OTHER): Payer: MEDICAID

## 2023-12-29 ENCOUNTER — Other Ambulatory Visit (HOSPITAL_COMMUNITY): Payer: Self-pay

## 2023-12-29 ENCOUNTER — Ambulatory Visit: Payer: MEDICAID

## 2023-12-29 ENCOUNTER — Encounter (HOSPITAL_COMMUNITY)
Admission: RE | Admit: 2023-12-29 | Discharge: 2023-12-29 | Disposition: A | Discharge status: Home / Self Care | Payer: MEDICAID | Source: Ambulatory Visit | Attending: Cardiology | Admitting: Cardiology

## 2023-12-29 VITALS — BP 118/70 | HR 84 | Ht 63.0 in | Wt 281.7 lb

## 2023-12-29 VITALS — BP 115/77 | HR 87 | Ht 63.0 in | Wt 281.0 lb

## 2023-12-29 DIAGNOSIS — R079 Chest pain, unspecified: Secondary | ICD-10-CM | POA: Insufficient documentation

## 2023-12-29 DIAGNOSIS — Z9861 Coronary angioplasty status: Secondary | ICD-10-CM | POA: Diagnosis present

## 2023-12-29 DIAGNOSIS — I10 Essential (primary) hypertension: Secondary | ICD-10-CM | POA: Diagnosis not present

## 2023-12-29 DIAGNOSIS — R002 Palpitations: Secondary | ICD-10-CM

## 2023-12-29 DIAGNOSIS — E785 Hyperlipidemia, unspecified: Secondary | ICD-10-CM | POA: Insufficient documentation

## 2023-12-29 DIAGNOSIS — G4733 Obstructive sleep apnea (adult) (pediatric): Secondary | ICD-10-CM | POA: Insufficient documentation

## 2023-12-29 DIAGNOSIS — I25118 Atherosclerotic heart disease of native coronary artery with other forms of angina pectoris: Secondary | ICD-10-CM | POA: Insufficient documentation

## 2023-12-29 HISTORY — DX: Atherosclerotic heart disease of native coronary artery without angina pectoris: I25.10

## 2023-12-29 HISTORY — DX: Hyperlipidemia, unspecified: E78.5

## 2023-12-29 MED ORDER — ISOSORBIDE MONONITRATE ER 30 MG PO TB24: 15.0000 mg | ORAL_TABLET | Freq: Every evening | ORAL | 3 refills | Status: DC

## 2023-12-29 NOTE — Progress Notes (Unsigned)
 ZIO serial # [id number] from office inventory applied to patient. Dr. Elmira to read.

## 2023-12-29 NOTE — Telephone Encounter (Signed)
 Spoke with Heide she needed the address for this morning's orientation which she already has.Hadassah Elpidio Quan RN BSN

## 2023-12-29 NOTE — Progress Notes (Signed)
 Patient reported having 4 out of 10 chest tightness towards the end of her 6 minute walk test. Vallerie also reported having dizziness and some sharp chest pains that lasted a few minutes. Karri denied feeling nauseated. Skin warm and dry. Blood pressure 118/72 heart rate max 120 now 84. Onsite provider Damien Braver NP notified. 12 lead ECG ordered obtained and reviewed. Patient was placed on 2l/min of oxygen. Aldine said that the  Damien Braver NP noted no acute changes. Shamecka said that she has been experiencing chest tightness at least 3 times a week especially when she exerts herself. Kendall says she has not taken any sublingual nitroglycerin  since she has been home from the hospital. Damien recommended that Ms Kosinski be evaluated at the office for further medical recommendations. Dr Gilda office called. Appointment obtained for [facility] to see Callie [facility] today at 1:50pm. Patient was given a copy of today's 12 lead ECG and vital signs from 6 minute walk test. Vallerie reported that the chest tightness and lightheadedness went away after a about 5 minutes. Will hold off on starting exercise until cleared by [facility] cardiology. Patient states understanding. Patient transported to vehicle by transport chair. Patient's significant other with the patient and is driving today.Hadassah Elpidio Quan RN BSN

## 2023-12-29 NOTE — Patient Instructions (Addendum)
 Thank you for choosing [facility]!     Medication Instructions:  Begin Imdur  15mg . Take this tablet at bedtime.  Stop the Lisinopril   Wear compression stockings to assist with dizziness.  Monitor blood pressure for 2 weeks. Record readings on blood pressure log.   *If you need a refill on your cardiac medications before your next appointment, please call your pharmacy*   Lab Work: No labs were ordered during today's visit.  If you have labs (blood work) drawn today and your tests are completely normal, you will receive your results only by: MyChart Message (if you have MyChart) OR A paper copy in the mail If you have any lab test that is abnormal or we need to change your treatment, we will call you to review the results.   Testing/Procedures:  ZIO XT- Long Term Monitor Instructions   Your physician has requested you wear your ZIO patch monitor 14 days.     This is a single patch monitor.  [employer] supplies one patch monitor per enrollment.  Additional stickers are not available.   Please do not apply patch if you will be having a Nuclear Stress Test, Echocardiogram, Cardiac CT, MRI, or Chest Xray during the time frame you would be wearing the monitor. The patch cannot be worn during these tests.  You cannot remove and re-apply the ZIO XT patch monitor.   Your ZIO patch monitor will be sent USPS Priority mail from [facility] directly to your home address. The monitor may also be mailed to a PO BOX if home delivery is not available.   It may take 3-5 days to receive your monitor after you have been enrolled.   Once you have received you monitor, please review enclosed instructions.  Your monitor has already been registered assigning a specific monitor serial # to you.   Applying the monitor   Shave hair from upper left chest.   Hold abrader disc by orange tab.  Rub abrader in 40 strokes over left upper chest as indicated in your monitor instructions.    Clean area with 4 enclosed alcohol  pads .  Use all pads to assure are is cleaned thoroughly.  Let dry.   Apply patch as indicated in monitor instructions.  Patch will be place under collarbone on left side of chest with arrow pointing upward.   Rub patch adhesive wings for 2 minutes.Remove white label marked 1.  Remove white label marked 2.  Rub patch adhesive wings for 2 additional minutes.   While looking in a mirror, press and release button in center of patch.  A small green light will flash 3-4 times .  This will be your only indicator the monitor has been turned on.     Do not shower for the first 24 hours.  You may shower after the first 24 hours.   Press button if you feel a symptom. You will hear a small click.  Record Date, Time and Symptom in the Patient Log Book.   When you are ready to remove patch, follow instructions on last 2 pages of Patient Log Book.  Stick patch monitor onto last page of Patient Log Book.   Place Patient Log Book in [place] box.  Use locking tab on box and tape box closed securely.  The [employer] and [employer] has [employer] on it.  Please place in mailbox as soon as possible.  Your physician should have your test results approximately 7 days after the monitor has been  mailed back to [facility]   Call [facility] Customer Care at [phone] if you have questions regarding your ZIO XT patch monitor.  Call them immediately if you see an orange light blinking on your monitor.   If your monitor falls off in less than 4 days contact our Monitor department at [phone].  If your monitor becomes loose or falls off after 4 days call [employer] at [phone] for suggestions on securing your monitor.      Provider:   Aline Door, PA-C           Follow-Up: At [facility], you and your health needs are our priority.  As part of our continuing mission to provide you with exceptional heart care, we have created designated Provider  Care Teams.  These Care Teams include your primary Cardiologist (physician) and Advanced Practice Providers (APPs -  Physician Assistants and Nurse Practitioners) who all work together to provide you with the care you need, when you need it. We recommend signing up for the patient portal called MyChart.  Sign up information is provided on this After Visit Summary.  MyChart is used to connect with patients for Virtual Visits (Telemedicine).  Patients are able to view lab/test results, encounter notes, upcoming appointments, etc.  Non-urgent messages can be sent to your provider as well.   To learn more about what you can do with MyChart, go to [email or web address].

## 2023-12-29 NOTE — Progress Notes (Unsigned)
 Patient unable to stay to have ZIO XT applied. Order and appointment cancelled. New order for home enrollment entered.  Enrolled for [employer] to mail a ZIO XT long term holter monitor to the patients address on file.   Dr. Elmira to read.

## 2023-12-29 NOTE — Progress Notes (Signed)
 Cardiology Office Note:    Date:  12/29/2023   ID:  [name], DOB 1980-12-08, MRN [id number]  PCP:  [name], PA  Cardiologist:  Newman JINNY [name], MD     Referring MD: [name], [place]   Chief Complaint: chest pain  History of Present Illness:    [name] is a 43 y.o. female with a history of CAD with NSTEMI in 04/2021 s/p PTCA/ DES to proximal LCX and then recent PTCA of in-stent restenosis of LCX and PTCA of ostial OM1, hypertension, hyperlipidemia, GERD, anemia, and anxiety/ depression who is followed by Dr. [name] and presents today for evaluation of chest pain.  Patient has a history of CAD with prior NSTEMI in 04/2021.  Echo at that time showed LVEF of 55 that was 80% with mild hypokinesis of the basal inferolateral wall and no significant valvular disease.  LHC showed 80% stenosis of proximal LCx and 70% stenoses of both the OM1 and OM 3 branches.  She underwent successful PCI with PTCA/DES to the LCx lesion at that time.  Subsequent Myoview in 08/2021 for recurrent chest pain showed no evidence of ischemia.  He was lost to follow-up for couple years but saw Dr. [name] in 10/2023 at which time she reported chest pain that she described as someone squeezing her heart both at rest and with exertion although it was worse with exertion.  Echo and outpatient cardiac catheterization were arranged.  Echo showed LVEF of 55-60% with normal wall motion, normal RV function, and mild AI.  LHC on 11/02/2023 showed 80% in-stent restenosis of LCx was a large trifurcating OM1 with 70% ostial stenosis.  She underwent successful PTCA with a drug-coated balloon to the LCx lesion and PTCA to the ostial OM1 lesion.  There was suspicion for a small thrombus at the ostium of OM1; therefore, further intervention was not performed in an effort to avoid further manipulation of the vessel.  She was placed on Aggrastat  for 6 hours and monitored overnight.  She was last seen by  Jackee Alberts, NP, on 11/09/2023 at which time she reported occasional episodes of chest discomfort but overall improvement.  She had not required any use of her sublingual Nitroglycerin .  Patient presented to cardiac rehab today for orientation and developed some chest tightness while walking there.  EKG showed normal sinus rhythm with no acute ischemic changes.  Session was stopped and she was added onto my schedule for today for further evaluation.  She describes 2 types of chest pain.  She describes the first type of chest pain as a sharp sensation that is sometimes on the left side of her chest and sometimes on the right and more pinpoint areas.  This pain normally occurs at rest and is typically brief but can last up to 1 to 2 minutes at a time.  The second type of pain is more of a tightness in the center of her chest.  This pain usually occurs with ambulating but again typically only last 1 to 2 minutes at a time.  Pain resolves when she takes a few deep breaths.  She estimates she has had 2-3 episodes of chest pain per week since her most recent PCI in 10/2023. Overall, symptoms are better than before recent PCI and she has not required any sublingual nitroglycerin .  She also describes some newer dyspnea on exertion over the last couple weeks but no shortness of breath at rest.  No orthopnea.  She does describe  occasionally waking up gasping for air but suspect this may be more due to untreated sleep apnea than true PND.  He reports previously being diagnosed with sleep apnea but has not been started on CPAP.  She describes snoring, apneic episodes, and fatigue.  She has been referred to a provider at [facility] to help initiate CPAP but is still waiting on this.  She describes some fluctuating lower extremity edema that sometimes improves with elevation.  She also reports palpitations that she describes as heart racing that can last for couple minutes at a time.  These episodes occur both at rest and with  exertion.  They are not always related to episodes of chest pain.  She describes some dizziness with quick position changes that sounds like orthostasis but denies any syncope.  She is compliant with her medications, specifically her DAPT.   EKGs/Labs/Other Studies Reviewed:    The following studies were reviewed:  Monitor 12/11/2019 to 12/19/2019: Dominant rhythm: Sinus. HR 52-174 bpm. Avg HR 98 bpm. No SVT/VT 3% isolated SVE, <1% couplet, notriplets. <1% isolated VE, couplet, no triplets. No atrial fibrillation/atrial flutter/SVT/VT/high grade AV block, sinus pause >3sec noted. 5 patient triggered events, correlate with supraventricular ectopy.  _______________  Echocardiogram 10/28/2023: Impressions:  1. Left ventricular ejection fraction, by estimation, is 55 to 60%. The  left ventricle has normal function. The left ventricle has no regional  wall motion abnormalities. Left ventricular diastolic parameters were  normal.   2. Right ventricular systolic function is normal. The right ventricular  size is normal. Tricuspid regurgitation signal is inadequate for assessing  PA pressure.   3. The mitral valve is grossly normal. Trivial mitral valve  regurgitation. No evidence of mitral stenosis.   4. The aortic valve is tricuspid. Aortic valve regurgitation is mild. No  aortic stenosis is present.   5. The inferior vena cava is normal in size with greater than 50%  respiratory variability, suggesting right atrial pressure of 3 mmHg.  _______________  Left Cardiac Catheterization 11/02/2023: LM: Normal LAD: No significant disease Lcx: Large, dominant         Prox stent with 80% restenosis         Large trifurcating OM1 with ostial 70% stenosis RCA: Small, non-dominant vessel. No significant disease   LVEDP 23 mmHg   Successful percutaneous coronary intervention prox Lcx/OM      Intravascular ultrasound (IVUS)      DCB PTCA prox Lcx 3.0 X 30 mm Agent drug coated balloon       PTCA OM1 with 3.5 X 8 mm balloon      Suspicion for small thrombus at ostium of OM1.  Given TIMI-3 flow, no chest pain, no EKG changes, and pressures of DCB and left circumflex stent, I wanted to avoid further manipulation of the vessel.  Therefore, I did not perform any further intervention, including IVUS.  I will place the patient on 6 hours of Aggrastat  and monitor overnight.   Patient was previously on aspirin  and Brilinta .  I will continue the same for another 1 year. Will make sure that patient's Repatha  is refilled, and emphasize compliance with   Unusually delayed procedure time due to use of multiple wires, abrupt vessel closure necessitating additional treatment to OM1 vessel, and eventually was after coated balloon.  EKG:  EKG from Cardiac Rehab earlier today was personally reviewed and demonstrates normal sinus rhythm, rate 81 bpm, with LVH but no acute ischemic changes compared to prior tracings.  Recent Labs: 11/03/2023: BUN  15; Creatinine, Ser 1.00; Potassium 3.9; Sodium 137 11/18/2023: Hemoglobin 10.7; Platelets 265  Recent Lipid Panel    Component Value Date/Time   CHOL 169 10/27/2023 1547   TRIG 114 10/27/2023 1547   HDL 55 10/27/2023 1547   CHOLHDL 3.1 10/27/2023 1547   CHOLHDL 5.2 04/23/2021 0638   VLDL 16 04/23/2021 0638   LDLCALC 94 10/27/2023 1547   LDLDIRECT 172.9 (H) 04/23/2021 0638    Physical Exam:    Vital Signs: BP 115/77 (BP Location: Right Arm, Patient Position: Sitting, Cuff Size: Normal)   Pulse 87   Ht 5' 3 (1.6 m)   Wt 281 lb (127.5 kg)   SpO2 98%   BMI 49.78 kg/m     Wt Readings from Last 3 Encounters:  12/29/23 281 lb (127.5 kg)  12/29/23 281 lb 12 oz (127.8 kg)  11/09/23 285 lb 12.8 oz (129.6 kg)     General: 43 y.o. African-American female in no acute distress. HEENT: Normocephalic and atraumatic. Sclera clear.  Neck: Supple. No carotid bruits. No JVD. Heart: RRR. Distinct S1 and S2. No murmurs, gallops, or rubs.  Lungs: No  increased work of breathing. Clear to ausculation bilaterally. No wheezes, rhonchi, or rales.  Extremities: No lower extremity edema.  Skin: Warm and dry. Neuro: No focal deficits. Psych: Normal affect. Responds appropriately.   Assessment:    1. Chest pain of uncertain etiology   2. Coronary artery disease of native artery of native heart with stable angina pectoris   3. Palpitations   4. Primary hypertension   5. Hyperlipidemia, unspecified hyperlipidemia type   6. Obstructive sleep apnea     Plan:    Chest Pain CAD History of NSTEMI in 2023 s/p DES to LCx and then recent PTCA to in-stent restenosis of LCx as well as PTCA to ostial OM1 in 10/2023.  She has had some continued intermittent chest pain since most recent PCI but it is overall improved.  She was seen at cardiac rehab earlier today for orientation and had some chest tightness while walking there.  EKG showed normal sinus rhythm with no acute ischemic changes. - Currently chest pain-free in the office. - Will start Imdur  15 mg once daily.  She does a history of headaches so she is going to try to take this at night after she takes her she does have a history of headaches that she is going to try to take this at night after she takes her nortriptyline as this has worked well for her when she has required sublingual Nitroglycerin . - Continue DAPT with Aspirin  1 mg daily and Brilinta  90 mg twice daily. - Recommended trying Imdur  for about a week before going back to Cardiac Rehab.  Will arrange follow-up with me in 2 weeks.  Palpitations She has a history of palpitations.  Prior monitor in 2021 showed PACs (3% burden) but no significant arrhythmias. - She she reports intermittent palpitations that she describes as heart racing that can last for couple minutes at a time. - Continue Diltiazem  120 mg daily and Lopressor  12.5 mg twice daily. - Will order 2 weeks ZIO monitor. - Also discussed checking electrolytes and TSH but she  would prefer not to have blood work drawn she does not like needles.  Can wait for monitor results.  Hypertension History of hypertension but it looks like BP typically runs on the softer side.  She also describes some dizziness with quick position changes which sounds like orthostasis. - BP stable at 115/77 today.  -  Current medications: Lisinopril  2.5mg  daily, Diltiazem  120mg  daily, and Lopressor  12.5mg  twice daily.  - Will add Imdur  15mg  daily for antianginal benefits.  - Will stop Lisinopril  given addition of Imdur  and soft BP in the past with orthostasis. - Asked patient to keep a BP/ HR log for 2 weeks and then send this to us .  - Recommended changing positions slowly and wearing compression stockings to help with orthostasis.   Hyperlipidemia Lipid panel in 10/2023: Total Cholesterol 169, Triglycerides 114, HDL 55, LDL 94. Lipoprotein (a) 199 in 10/2023.  - Continue Crestor  20mg  daily.  - Continue Repatha . This was restarted around time of last lipid check after she had been off of this for a few months.  - Recommend repeat lipid panel and LFTs in about 1 month. Can order this at follow-up visit.   Obstructive Sleep Apnea Patient reports previously being diagnosed with obstructive sleep apnea but has not been on CPAP before.  She describes loud snoring, apneic episodes, and fatigue.  She has been referred to a provider at [facility] to help initiate CPAP but is still waiting to hear from them.   Disposition: Follow up with me in 2 weeks.    Signed, Aline FORBES Door, PA-C  12/29/2023 3:01 PM    [facility]

## 2023-12-29 NOTE — Progress Notes (Addendum)
 Cardiac Rehab Medication Review by a Nurse  Does the patient  feel that his/her medications are working for him/her?  yes  Has the patient been experiencing any side effects to the medications prescribed?  no  Does the patient measure his/her own blood pressure or blood glucose at home?  no   Does the patient have any problems obtaining medications due to transportation or finances?   no  Understanding of regimen: good Understanding of indications: good Potential of compliance: good    Nurse comments: [name] is taking her medications as prescribed and has a good understanding of what her medications are for. [name] has a BP cuff/monitor. [name] does not take her blood pressure on a regular basis. [name] said she did not have pre authorization. [facility] [address] location called and notified. [name]'s repatha  has been authorized. [name] will go and pick her prescription after  cardiac rehab orientation.    [name] [facility] RN 12/29/2023 10:45 AM

## 2023-12-29 NOTE — Progress Notes (Signed)
 Cardiac Individual Treatment Plan  Patient Details  Name: [name] MRN: [id number] Date of Birth: 17-Mar-1980 Referring Provider:   Flowsheet Row CARDIAC REHAB PHASE II ORIENTATION from 12/29/2023 in [facility]  Referring Provider Newman [name], MD    Initial Encounter Date:  Flowsheet Row CARDIAC REHAB PHASE II ORIENTATION from 12/29/2023 in [facility]  Date 12/29/23    Visit Diagnosis: 11/02/23 S/P PTCA CFX, OM1  Chest pain, unspecified type - Plan: EKG - 12 lead, EKG - 12 lead  Patient's Home Medications on Admission:  Current Outpatient Medications:    ARIPiprazole (ABILIFY) 5 MG tablet, Take 5 mg by mouth at bedtime., Disp: , Rfl:    aspirin  EC 81 MG tablet, Take 1 tablet (81 mg total) by mouth daily. Swallow whole., Disp: 90 tablet, Rfl: 3   diltiazem  (CARDIZEM  CD) 120 MG 24 hr capsule, Take 1 capsule (120 mg total) by mouth daily., Disp: 90 capsule, Rfl: 2   fluticasone (FLONASE) 50 MCG/ACT nasal spray, Place 2 sprays into both nostrils daily as needed for allergies., Disp: , Rfl:    linaclotide (LINZESS) 72 MCG capsule, Take 72 mcg by mouth daily as needed (for constipation)., Disp: , Rfl:    metoCLOPramide  (REGLAN ) 10 MG tablet, Take 10 mg by mouth every 8 (eight) hours as needed for nausea., Disp: , Rfl:    metoprolol  tartrate (LOPRESSOR ) 25 MG tablet, Take 1/2 tablet (12.5 mg total) by mouth 2 (two) times daily., Disp: 180 tablet, Rfl: 3   Multiple Vitamin (MULTIVITAMIN) capsule, Take 1 capsule by mouth daily with breakfast., Disp: , Rfl:    nitroGLYCERIN  (NITROSTAT ) 0.4 MG SL tablet, Place 1 tablet (0.4 mg total) under the tongue every 5 (five) minutes as needed., Disp: 25 tablet, Rfl: 3   nortriptyline (PAMELOR) 50 MG capsule, Take 50 mg by mouth at bedtime., Disp: , Rfl:    omeprazole  (PRILOSEC) 40 MG capsule, Take 40 mg by mouth daily.,  Disp: , Rfl:    promethazine  (PHENERGAN ) 25 MG tablet, Take 1 tablet (25 mg total) by mouth every 6 (six) hours as needed for nausea or vomiting., Disp: 30 tablet, Rfl: 0   rosuvastatin  (CRESTOR ) 20 MG tablet, Take 1 tablet (20 mg total) by mouth daily., Disp: 90 tablet, Rfl: 3   sertraline  (ZOLOFT ) 25 MG tablet, Take 25 mg by mouth at bedtime., Disp: , Rfl:    ticagrelor  (BRILINTA ) 90 MG TABS tablet, Take 1 tablet (90 mg total) by mouth 2 (two) times daily., Disp: 60 tablet, Rfl: 11   VENTOLIN HFA 108 (90 Base) MCG/ACT inhaler, Inhale 2 puffs into the lungs every 6 (six) hours as needed for wheezing or shortness of breath., Disp: , Rfl:    VISINE 0.025-0.3 % ophthalmic solution, Place 1 drop into both eyes daily as needed for eye irritation., Disp: , Rfl:    Evolocumab  (REPATHA  SURECLICK) 140 MG/ML SOAJ, Inject 140 mg into the skin every 14 (fourteen) days., Disp: 6 mL, Rfl: 3   isosorbide  mononitrate (IMDUR ) 30 MG 24 hr tablet, Take 0.5 tablets (15 mg total) by mouth at bedtime., Disp: 30 tablet, Rfl: 3  Past Medical History: Past Medical History:  Diagnosis Date   Acid reflux    Alcohol  abuse    Anemia    Coronary artery disease    Depression    GERD (gastroesophageal reflux disease)    H/O suicide attempt  Hyperlipidemia    Hypertension    Vaginal Pap smear, abnormal     Tobacco Use: Social History   Tobacco Use  Smoking Status Former   Current packs/day: 0.00   Average packs/day: 0.3 packs/day for 1 year (0.3 ttl pk-yrs)   Types: Cigarettes   Start date: 12/07/1998   Quit date: 12/07/1999   Years since quitting: 24.0  Smokeless Tobacco Never  Tobacco Comments   smoked 3-4 years    Labs: Review Flowsheet  More data exists      Latest Ref Rng & Units 04/22/2021 04/23/2021 06/05/2021 10/13/2021 10/27/2023  Labs for ITP Cardiac and Pulmonary Rehab  Cholestrol 100 - 199 mg/dL - 760  808  873  830   LDL (calc) 0 - 99 mg/dL - 822  882  44  94   Direct LDL 0 - 99 mg/dL - 827.0   - - -  HDL-C >39 mg/dL - 46  54  61  55   Trlycerides 0 - 149 mg/dL - 79  887  881  885   Hemoglobin A1c 4.8 - 5.6 % 6.0  - - - -  TCO2 22 - 32 mmol/L 23  - - - -    Capillary Blood Glucose: Lab Results  Component Value Date   GLUCAP 110 (H) 12/21/2013   GLUCAP 103 (H) 02/29/2008   GLUCAP 109 (H) 02/29/2008     Exercise Target Goals: Exercise Program Goal: Individual exercise prescription set using results from initial 6 min walk test and THRR while considering  patient's activity barriers and safety.   Exercise Prescription Goal: Initial exercise prescription builds to 30-45 minutes a day of aerobic activity, 2-3 days per week.  Home exercise guidelines will be given to patient during program as part of exercise prescription that the participant will acknowledge.  Activity Barriers & Risk Stratification:  Activity Barriers & Cardiac Risk Stratification - 12/29/23 1351       Activity Barriers & Cardiac Risk Stratification   Activity Barriers Balance Concerns;History of Falls;Deconditioning;Muscular Weakness;Chest Pain/Angina;Joint Problems;Arthritis    Cardiac Risk Stratification High          6 Minute Walk:  6 Minute Walk     Row Name 12/29/23 1347         6 Minute Walk   Phase Initial     Distance 840 feet     Walk Time 6 minutes     # of Rest Breaks 1  5:41-6:00 (chest tightness, dizziness)     MPH 1.6     METS 2.5     RPE 11     Perceived Dyspnea  0     VO2 Peak 8.75     Symptoms Yes (comment)     Comments Chest tightness (4/10), lightheadedness at end of walk. Pt vocies that it actually started around 3 minutes but did not stop and inform staff as requested in the explaination of the .     Resting HR 84 bpm     Resting BP 118/70     Resting Oxygen Saturation  96 %     Exercise Oxygen Saturation  during 6 min walk 100 %     Max Ex. HR 120 bpm     Max Ex. BP 120/70     2 Minute Post BP 118/72        Oxygen Initial Assessment:   Oxygen  Re-Evaluation:   Oxygen Discharge (Final Oxygen Re-Evaluation):   Initial Exercise Prescription:  Initial Exercise Prescription -  12/29/23 1300       Date of Initial Exercise RX and Referring Provider   Date 12/29/23    Referring Provider Newman [name], MD    Expected Discharge Date 02/08/24      NuStep   Level 1    SPM 75    Minutes 15    METs 2.5      Track   Laps 8    Minutes 15    METs 2.5      Prescription Details   Frequency (times per week) 3    Duration Progress to 30 minutes of continuous aerobic without signs/symptoms of physical distress      Intensity   THRR 40-80% of Max Heartrate 71-142    Ratings of Perceived Exertion 11-13    Perceived Dyspnea 0-4      Progression   Progression Continue progressive overload as per policy without signs/symptoms or physical distress.      Resistance Training   Training Prescription Yes    Weight 3lbs    Reps 10-15          Perform Capillary Blood Glucose checks as needed.  Exercise Prescription Changes:   Exercise Comments:   Exercise Goals and Review:   Exercise Goals     Row Name 12/29/23 1351             Exercise Goals   Increase Physical Activity Yes       Intervention Provide advice, education, support and counseling about physical activity/exercise needs.;Develop an individualized exercise prescription for aerobic and resistive training based on initial evaluation findings, risk stratification, comorbidities and participant's personal goals.       Expected Outcomes Long Term: Exercising regularly at least 3-5 days a week.;Short Term: Attend rehab on a regular basis to increase amount of physical activity.;Long Term: Add in home exercise to make exercise part of routine and to increase amount of physical activity.       Increase Strength and Stamina Yes       Intervention Provide advice, education, support and counseling about physical activity/exercise needs.;Develop an individualized  exercise prescription for aerobic and resistive training based on initial evaluation findings, risk stratification, comorbidities and participant's personal goals.       Expected Outcomes Short Term: Increase workloads from initial exercise prescription for resistance, speed, and METs.;Short Term: Perform resistance training exercises routinely during rehab and add in resistance training at home;Long Term: Improve cardiorespiratory fitness, muscular endurance and strength as measured by increased METs and functional capacity ( )       Able to understand and use rate of perceived exertion (RPE) scale Yes       Intervention Provide education and explanation on how to use RPE scale       Expected Outcomes Short Term: Able to use RPE daily in rehab to express subjective intensity level;Long Term:  Able to use RPE to guide intensity level when exercising independently       Knowledge and understanding of Target Heart Rate Range (THRR) Yes       Intervention Provide education and explanation of THRR including how the numbers were predicted and where they are located for reference       Expected Outcomes Short Term: Able to state/look up THRR;Long Term: Able to use THRR to govern intensity when exercising independently;Short Term: Able to use daily as guideline for intensity in rehab       Understanding of Exercise Prescription Yes       Intervention Provide  education, explanation, and written materials on patient's individual exercise prescription       Expected Outcomes Short Term: Able to explain program exercise prescription;Long Term: Able to explain home exercise prescription to exercise independently          Exercise Goals Re-Evaluation :   Discharge Exercise Prescription (Final Exercise Prescription Changes):   Nutrition:  Target Goals: Understanding of nutrition guidelines, daily intake of sodium 1500mg , cholesterol 200mg , calories 30% from fat and 7% or less from saturated fats, daily  to have 5 or more servings of fruits and vegetables.  Biometrics:  Pre Biometrics - 12/29/23 1100       Pre Biometrics   Waist Circumference 53.25 inches    Hip Circumference 57.5 inches    Waist to Hip Ratio 0.93 %    Triceps Skinfold 60 mm    % Body Fat 60.4 %    Grip Strength 32 kg    Flexibility 11 in    Single Leg Stand 12.8 seconds           Nutrition Therapy Plan and Nutrition Goals:   Nutrition Assessments:  MEDIFICTS Score Key: >=70 Need to make dietary changes  40-70 Heart Healthy Diet <= 40 Therapeutic Level Cholesterol Diet    Picture Your Plate Scores: <59 Unhealthy dietary pattern with much room for improvement. 41-50 Dietary pattern unlikely to meet recommendations for good health and room for improvement. 51-60 More healthful dietary pattern, with some room for improvement.  >60 Healthy dietary pattern, although there may be some specific behaviors that could be improved.    Nutrition Goals Re-Evaluation:   Nutrition Goals Re-Evaluation:   Nutrition Goals Discharge (Final Nutrition Goals Re-Evaluation):   Psychosocial: Target Goals: Acknowledge presence or absence of significant depression and/or stress, maximize coping skills, provide positive support system. Participant is able to verbalize types and ability to use techniques and skills needed for reducing stress and depression.  Initial Review & Psychosocial Screening:  Initial Psych Review & Screening - 12/29/23 1208       Initial Review   Current issues with History of Depression;Current Stress Concerns    Source of Stress Concerns Financial;Chronic Illness    Comments Geral has a history of depression. Zennie is currently taking anitdepressants. Heide is receiving counselling one a week. Fany says she can get counseling more frequently if she needs to. 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 Candace says she has financial stressor as she does  not have income coming in when she does not have clients.      Family Dynamics   Good Support System? Yes   Nakira has her signifigant other Koren and her 3 children ages 65, 23 and 105 for support. Caryle says that she is her mother's caregiver.     Barriers   Psychosocial barriers to participate in program The patient should benefit from training in stress management and relaxation.      Screening Interventions   Interventions Encouraged to exercise;To provide support and resources with identified psychosocial needs;Provide feedback about the scores to participant    Expected Outcomes Long Term Goal: Stressors or current issues are controlled or eliminated.;Short Term goal: Identification and review with participant of any Quality of Life or Depression concerns found by scoring the questionnaire.;Long Term goal: The participant improves quality of Life and PHQ9 Scores as seen by post scores and/or verbalization of changes          Quality of Life Scores:  Quality of  Life - 12/29/23 1346       Quality of Life   Select Quality of Life      Quality of Life Scores   Health/Function Pre 25.6 %    Socioeconomic Pre 25.71 %    Psych/Spiritual Pre 26.57 %    Family Pre 24 %    GLOBAL Pre 25.59 %         Scores of 19 and below usually indicate a poorer quality of life in these areas.  A difference of  2-3 points is a clinically meaningful difference.  A difference of 2-3 points in the total score of the Quality of Life Index has been associated with significant improvement in overall quality of life, self-image, physical symptoms, and general health in studies assessing change in quality of life.  PHQ-9: Review Flowsheet  More data exists      12/29/2023 02/20/2014 01/23/2014 12/12/2013 11/28/2013  Depression screen PHQ 2/9  Decreased Interest 0 0 0 0 0  Down, Depressed, Hopeless 0 0 0 0 0  PHQ - 2 Score 0 0 0 0 0  Altered sleeping 3 - - - -  Tired, decreased energy 1 - - - -   Change in appetite 2 - - - -  Feeling bad or failure about yourself  1 - - - -  Trouble concentrating 1 - - - -  Moving slowly or fidgety/restless 0 - - - -  Suicidal thoughts 0 - - - -  PHQ-9 Score 8 - - - -  Difficult doing work/chores Not difficult at all - - - -   Interpretation of Total Score  Total Score Depression Severity:  1-4 = Minimal depression, 5-9 = Mild depression, 10-14 = Moderate depression, 15-19 = Moderately severe depression, 20-27 = Severe depression   Psychosocial Evaluation and Intervention:   Psychosocial Re-Evaluation:   Psychosocial Discharge (Final Psychosocial Re-Evaluation):   Vocational Rehabilitation: Provide vocational rehab assistance to qualifying candidates.   Vocational Rehab Evaluation & Intervention:  Vocational Rehab - 12/29/23 1216       Initial Vocational Rehab Evaluation & Intervention   Assessment shows need for Vocational Rehabilitation No   Keshana works and does not need vocational rehab at this time.         Education: Education Goals: Education classes will be provided on a weekly basis, covering required topics. Participant will state understanding/return demonstration of topics presented.     Core Videos: Exercise    Move It!  Clinical staff conducted group or individual video education with verbal and written material and guidebook.  Patient learns the recommended Pritikin exercise program. Exercise with the goal of living a long, healthy life. Some of the health benefits of exercise include controlled diabetes, healthier blood pressure levels, improved cholesterol levels, improved heart and lung capacity, improved sleep, and better body composition. Everyone should speak with their doctor before starting or changing an exercise routine.  Biomechanical Limitations Clinical staff conducted group or individual video education with verbal and written material and guidebook.  Patient learns how biomechanical limitations  can impact exercise and how we can mitigate and possibly overcome limitations to have an impactful and balanced exercise routine.  Body Composition Clinical staff conducted group or individual video education with verbal and written material and guidebook.  Patient learns that body composition (ratio of muscle mass to fat mass) is a key component to assessing overall fitness, rather than body weight alone. Increased fat mass, especially visceral belly fat, can put us  at  increased risk for metabolic syndrome, type 2 diabetes, heart disease, and even death. It is recommended to combine diet and exercise (cardiovascular and resistance training) to improve your body composition. Seek guidance from your physician and exercise physiologist before implementing an exercise routine.  Exercise Action Plan Clinical staff conducted group or individual video education with verbal and written material and guidebook.  Patient learns the recommended strategies to achieve and enjoy long-term exercise adherence, including variety, self-motivation, self-efficacy, and positive decision making. Benefits of exercise include fitness, good health, weight management, more energy, better sleep, less stress, and overall well-being.  Medical   Heart Disease Risk Reduction Clinical staff conducted group or individual video education with verbal and written material and guidebook.  Patient learns our heart is our most vital organ as it circulates oxygen, nutrients, white blood cells, and hormones throughout the entire body, and carries waste away. Data supports a plant-based eating plan like the Pritikin Program for its effectiveness in slowing progression of and reversing heart disease. The video provides a number of recommendations to address heart disease.   Metabolic Syndrome and Belly Fat  Clinical staff conducted group or individual video education with verbal and written material and guidebook.  Patient learns what  metabolic syndrome is, how it leads to heart disease, and how one can reverse it and keep it from coming back. You have metabolic syndrome if you have 3 of the following 5 criteria: abdominal obesity, high blood pressure, high triglycerides, low HDL cholesterol, and high blood sugar.  Hypertension and Heart Disease Clinical staff conducted group or individual video education with verbal and written material and guidebook.  Patient learns that high blood pressure, or hypertension, is very common in the [place] . Hypertension is largely due to excessive salt intake, but other important risk factors include being overweight, physical inactivity, drinking too much alcohol , smoking, and not eating enough potassium from fruits and vegetables. High blood pressure is a leading risk factor for heart attack, stroke, congestive heart failure, dementia, kidney failure, and premature death. Long-term effects of excessive salt intake include stiffening of the arteries and thickening of heart muscle and organ damage. Recommendations include ways to reduce hypertension and the risk of heart disease.  Diseases of Our Time - Focusing on Diabetes Clinical staff conducted group or individual video education with verbal and written material and guidebook.  Patient learns why the best way to stop diseases of our time is prevention, through food and other lifestyle changes. Medicine (such as prescription pills and surgeries) is often only a Band-Aid on the problem, not a long-term solution. Most common diseases of our time include obesity, type 2 diabetes, hypertension, heart disease, and cancer. The Pritikin Program is recommended and has been proven to help reduce, reverse, and/or prevent the damaging effects of metabolic syndrome.  Nutrition   Overview of the Pritikin Eating Plan  Clinical staff conducted group or individual video education with verbal and written material and guidebook.  Patient learns about the  Pritikin Eating Plan for disease risk reduction. The Pritikin Eating Plan emphasizes a wide variety of unrefined, minimally-processed carbohydrates, like fruits, vegetables, whole grains, and legumes. Go, Caution, and Stop food choices are explained. Plant-based and lean animal proteins are emphasized. Rationale provided for low sodium intake for blood pressure control, low added sugars for blood sugar stabilization, and low added fats and oils for coronary artery disease risk reduction and weight management.  Calorie Density  Clinical staff conducted group or individual video education with verbal  and written material and guidebook.  Patient learns about calorie density and how it impacts the Pritikin Eating Plan. Knowing the characteristics of the food you choose will help you decide whether those foods will lead to weight gain or weight loss, and whether you want to consume more or less of them. Weight loss is usually a side effect of the Pritikin Eating Plan because of its focus on low calorie-dense foods.  Label Reading  Clinical staff conducted group or individual video education with verbal and written material and guidebook.  Patient learns about the Pritikin recommended label reading guidelines and corresponding recommendations regarding calorie density, added sugars, sodium content, and whole grains.  Dining Out - Part 1  Clinical staff conducted group or individual video education with verbal and written material and guidebook.  Patient learns that restaurant meals can be sabotaging because they can be so high in calories, fat, sodium, and/or sugar. Patient learns recommended strategies on how to positively address this and avoid unhealthy pitfalls.  Facts on Fats  Clinical staff conducted group or individual video education with verbal and written material and guidebook.  Patient learns that lifestyle modifications can be just as effective, if not more so, as many medications for lowering  your risk of heart disease. A Pritikin lifestyle can help to reduce your risk of inflammation and atherosclerosis (cholesterol build-up, or plaque, in the artery walls). Lifestyle interventions such as dietary choices and physical activity address the cause of atherosclerosis. A review of the types of fats and their impact on blood cholesterol levels, along with dietary recommendations to reduce fat intake is also included.  Nutrition Action Plan  Clinical staff conducted group or individual video education with verbal and written material and guidebook.  Patient learns how to incorporate Pritikin recommendations into their lifestyle. Recommendations include planning and keeping personal health goals in mind as an important part of their success.  Healthy Mind-Set    Healthy Minds, Bodies, Hearts  Clinical staff conducted group or individual video education with verbal and written material and guidebook.  Patient learns how to identify when they are stressed. Video will discuss the impact of that stress, as well as the many benefits of stress management. Patient will also be introduced to stress management techniques. The way we think, act, and feel has an impact on our hearts.  How Our Thoughts Can Heal Our Hearts  Clinical staff conducted group or individual video education with verbal and written material and guidebook.  Patient learns that negative thoughts can cause depression and anxiety. This can result in negative lifestyle behavior and serious health problems. Cognitive behavioral therapy is an effective method to help control our thoughts in order to change and improve our emotional outlook.  Additional Videos:  Exercise    Improving Performance  Clinical staff conducted group or individual video education with verbal and written material and guidebook.  Patient learns to use a non-linear approach by alternating intensity levels and lengths of time spent exercising to help burn more  calories and lose more body fat. Cardiovascular exercise helps improve heart health, metabolism, hormonal balance, blood sugar control, and recovery from fatigue. Resistance training improves strength, endurance, balance, coordination, reaction time, metabolism, and muscle mass. Flexibility exercise improves circulation, posture, and balance. Seek guidance from your physician and exercise physiologist before implementing an exercise routine and learn your capabilities and proper form for all exercise.  Introduction to Yoga  Clinical staff conducted group or individual video education with verbal and written material  and guidebook.  Patient learns about yoga, a discipline of the coming together of mind, breath, and body. The benefits of yoga include improved flexibility, improved range of motion, better posture and core strength, increased lung function, weight loss, and positive self-image. Yoga's heart health benefits include lowered blood pressure, healthier heart rate, decreased cholesterol and triglyceride levels, improved immune function, and reduced stress. Seek guidance from your physician and exercise physiologist before implementing an exercise routine and learn your capabilities and proper form for all exercise.  Medical   Aging: Enhancing Your Quality of Life  Clinical staff conducted group or individual video education with verbal and written material and guidebook.  Patient learns key strategies and recommendations to stay in good physical health and enhance quality of life, such as prevention strategies, having an advocate, securing a Health Care Proxy and Power of Attorney, and keeping a list of medications and system for tracking them. It also discusses how to avoid risk for bone loss.  Biology of Weight Control  Clinical staff conducted group or individual video education with verbal and written material and guidebook.  Patient learns that weight gain occurs because we consume more  calories than we burn (eating more, moving less). Even if your body weight is normal, you may have higher ratios of fat compared to muscle mass. Too much body fat puts you at increased risk for cardiovascular disease, heart attack, stroke, type 2 diabetes, and obesity-related cancers. In addition to exercise, following the Pritikin Eating Plan can help reduce your risk.  Decoding Lab Results  Clinical staff conducted group or individual video education with verbal and written material and guidebook.  Patient learns that lab test reflects one measurement whose values change over time and are influenced by many factors, including medication, stress, sleep, exercise, food, hydration, pre-existing medical conditions, and more. It is recommended to use the knowledge from this video to become more involved with your lab results and evaluate your numbers to speak with your doctor.   Diseases of Our Time - Overview  Clinical staff conducted group or individual video education with verbal and written material and guidebook.  Patient learns that according to the CDC, 50% to 70% of chronic diseases (such as obesity, type 2 diabetes, elevated lipids, hypertension, and heart disease) are avoidable through lifestyle improvements including healthier food choices, listening to satiety cues, and increased physical activity.  Sleep Disorders Clinical staff conducted group or individual video education with verbal and written material and guidebook.  Patient learns how good quality and duration of sleep are important to overall health and well-being. Patient also learns about sleep disorders and how they impact health along with recommendations to address them, including discussing with a physician.  Nutrition  Dining Out - Part 2 Clinical staff conducted group or individual video education with verbal and written material and guidebook.  Patient learns how to plan ahead and communicate in order to maximize their  dining experience in a healthy and nutritious manner. Included are recommended food choices based on the type of restaurant the patient is visiting.   Fueling a Banker conducted group or individual video education with verbal and written material and guidebook.  There is a strong connection between our food choices and our health. Diseases like obesity and type 2 diabetes are very prevalent and are in large-part due to lifestyle choices. The Pritikin Eating Plan provides plenty of food and hunger-curbing satisfaction. It is easy to follow, affordable, and helps reduce health  risks.  Menu Workshop  Clinical staff conducted group or individual video education with verbal and written material and guidebook.  Patient learns that restaurant meals can sabotage health goals because they are often packed with calories, fat, sodium, and sugar. Recommendations include strategies to plan ahead and to communicate with the manager, chef, or server to help order a healthier meal.  Planning Your Eating Strategy  Clinical staff conducted group or individual video education with verbal and written material and guidebook.  Patient learns about the Pritikin Eating Plan and its benefit of reducing the risk of disease. The Pritikin Eating Plan does not focus on calories. Instead, it emphasizes high-quality, nutrient-rich foods. By knowing the characteristics of the foods, we choose, we can determine their calorie density and make informed decisions.  Targeting Your Nutrition Priorities  Clinical staff conducted group or individual video education with verbal and written material and guidebook.  Patient learns that lifestyle habits have a tremendous impact on disease risk and progression. This video provides eating and physical activity recommendations based on your personal health goals, such as reducing LDL cholesterol, losing weight, preventing or controlling type 2 diabetes, and reducing high  blood pressure.  Vitamins and Minerals  Clinical staff conducted group or individual video education with verbal and written material and guidebook.  Patient learns different ways to obtain key vitamins and minerals, including through a recommended healthy diet. It is important to discuss all supplements you take with your doctor.   Healthy Mind-Set    Smoking Cessation  Clinical staff conducted group or individual video education with verbal and written material and guidebook.  Patient learns that cigarette smoking and tobacco addiction pose a serious health risk which affects millions of people. Stopping smoking will significantly reduce the risk of heart disease, lung disease, and many forms of cancer. Recommended strategies for quitting are covered, including working with your doctor to develop a successful plan.  Culinary   Becoming a Set designer conducted group or individual video education with verbal and written material and guidebook.  Patient learns that cooking at home can be healthy, cost-effective, quick, and puts them in control. Keys to cooking healthy recipes will include looking at your recipe, assessing your equipment needs, planning ahead, making it simple, choosing cost-effective seasonal ingredients, and limiting the use of added fats, salts, and sugars.  Cooking - Breakfast and Snacks  Clinical staff conducted group or individual video education with verbal and written material and guidebook.  Patient learns how important breakfast is to satiety and nutrition through the entire day. Recommendations include key foods to eat during breakfast to help stabilize blood sugar levels and to prevent overeating at meals later in the day. Planning ahead is also a key component.  Cooking - Educational psychologist conducted group or individual video education with verbal and written material and guidebook.  Patient learns eating strategies to improve overall  health, including an approach to cook more at home. Recommendations include thinking of animal protein as a side on your plate rather than center stage and focusing instead on lower calorie dense options like vegetables, fruits, whole grains, and plant-based proteins, such as beans. Making sauces in large quantities to freeze for later and leaving the skin on your vegetables are also recommended to maximize your experience.  Cooking - Healthy Salads and Dressing Clinical staff conducted group or individual video education with verbal and written material and guidebook.  Patient learns that vegetables, fruits, whole grains,  and legumes are the foundations of the Pritikin Eating Plan. Recommendations include how to incorporate each of these in flavorful and healthy salads, and how to create homemade salad dressings. Proper handling of ingredients is also covered. Cooking - Soups and [employer] - Soups and Desserts Clinical staff conducted group or individual video education with verbal and written material and guidebook.  Patient learns that Pritikin soups and desserts make for easy, nutritious, and delicious snacks and meal components that are low in sodium, fat, sugar, and calorie density, while high in vitamins, minerals, and filling fiber. Recommendations include simple and healthy ideas for soups and desserts.   Overview     The Pritikin Solution Program Overview Clinical staff conducted group or individual video education with verbal and written material and guidebook.  Patient learns that the results of the Pritikin Program have been documented in more than 100 articles published in peer-reviewed journals, and the benefits include reducing risk factors for (and, in some cases, even reversing) high cholesterol, high blood pressure, type 2 diabetes, obesity, and more! An overview of the three key pillars of the Pritikin Program will be covered: eating well, doing regular exercise, and having a  healthy mind-set.  WORKSHOPS  Exercise: Exercise Basics: Building Your Action Plan Clinical staff led group instruction and group discussion with PowerPoint presentation and patient guidebook. To enhance the learning environment the use of posters, models and videos may be added. At the conclusion of this workshop, patients will comprehend the difference between physical activity and exercise, as well as the benefits of incorporating both, into their routine. Patients will understand the FITT (Frequency, Intensity, Time, and Type) principle and how to use it to build an exercise action plan. In addition, safety concerns and other considerations for exercise and cardiac rehab will be addressed by the presenter. The purpose of this lesson is to promote a comprehensive and effective weekly exercise routine in order to improve patients' overall level of fitness.   Managing Heart Disease: Your Path to a Healthier Heart Clinical staff led group instruction and group discussion with PowerPoint presentation and patient guidebook. To enhance the learning environment the use of posters, models and videos may be added.At the conclusion of this workshop, patients will understand the anatomy and physiology of the heart. Additionally, they will understand how Pritikin's three pillars impact the risk factors, the progression, and the management of heart disease.  The purpose of this lesson is to provide a high-level overview of the heart, heart disease, and how the Pritikin lifestyle positively impacts risk factors.  Exercise Biomechanics Clinical staff led group instruction and group discussion with PowerPoint presentation and patient guidebook. To enhance the learning environment the use of posters, models and videos may be added. Patients will learn how the structural parts of their bodies function and how these functions impact their daily activities, movement, and exercise. Patients will learn how to  promote a neutral spine, learn how to manage pain, and identify ways to improve their physical movement in order to promote healthy living. The purpose of this lesson is to expose patients to common physical limitations that impact physical activity. Participants will learn practical ways to adapt and manage aches and pains, and to minimize their effect on regular exercise. Patients will learn how to maintain good posture while sitting, walking, and lifting.  Balance Training and Fall Prevention  Clinical staff led group instruction and group discussion with PowerPoint presentation and patient guidebook. To enhance the learning environment the use  of posters, models and videos may be added. At the conclusion of this workshop, patients will understand the importance of their sensorimotor skills (vision, proprioception, and the vestibular system) in maintaining their ability to balance as they age. Patients will apply a variety of balancing exercises that are appropriate for their current level of function. Patients will understand the common causes for poor balance, possible solutions to these problems, and ways to modify their physical environment in order to minimize their fall risk. The purpose of this lesson is to teach patients about the importance of maintaining balance as they age and ways to minimize their risk of falling.  WORKSHOPS   Nutrition:  Fueling a Ship broker led group instruction and group discussion with PowerPoint presentation and patient guidebook. To enhance the learning environment the use of posters, models and videos may be added. Patients will review the foundational principles of the Pritikin Eating Plan and understand what constitutes a serving size in each of the food groups. Patients will also learn Pritikin-friendly foods that are better choices when away from home and review make-ahead meal and snack options. Calorie density will be reviewed and  applied to three nutrition priorities: weight maintenance, weight loss, and weight gain. The purpose of this lesson is to reinforce (in a group setting) the key concepts around what patients are recommended to eat and how to apply these guidelines when away from home by planning and selecting Pritikin-friendly options. Patients will understand how calorie density may be adjusted for different weight management goals.  Mindful Eating  Clinical staff led group instruction and group discussion with PowerPoint presentation and patient guidebook. To enhance the learning environment the use of posters, models and videos may be added. Patients will briefly review the concepts of the Pritikin Eating Plan and the importance of low-calorie dense foods. The concept of mindful eating will be introduced as well as the importance of paying attention to internal hunger signals. Triggers for non-hunger eating and techniques for dealing with triggers will be explored. The purpose of this lesson is to provide patients with the opportunity to review the basic principles of the Pritikin Eating Plan, discuss the value of eating mindfully and how to measure internal cues of hunger and fullness using the Hunger Scale. Patients will also discuss reasons for non-hunger eating and learn strategies to use for controlling emotional eating.  Targeting Your Nutrition Priorities Clinical staff led group instruction and group discussion with PowerPoint presentation and patient guidebook. To enhance the learning environment the use of posters, models and videos may be added. Patients will learn how to determine their genetic susceptibility to disease by reviewing their family history. Patients will gain insight into the importance of diet as part of an overall healthy lifestyle in mitigating the impact of genetics and other environmental insults. The purpose of this lesson is to provide patients with the opportunity to assess their personal  nutrition priorities by looking at their family history, their own health history and current risk factors. Patients will also be able to discuss ways of prioritizing and modifying the Pritikin Eating Plan for their highest risk areas  Menu  Clinical staff led group instruction and group discussion with PowerPoint presentation and patient guidebook. To enhance the learning environment the use of posters, models and videos may be added. Using menus brought in from [employer], or printed from [employer], patients will apply the Pritikin dining out guidelines that were presented in the [employer] video. Patients  will also be able to practice these guidelines in a variety of provided scenarios. The purpose of this lesson is to provide patients with the opportunity to practice hands-on learning of the Pritikin Dining Out guidelines with actual menus and practice scenarios.  Label Reading Clinical staff led group instruction and group discussion with PowerPoint presentation and patient guidebook. To enhance the learning environment the use of posters, models and videos may be added. Patients will review and discuss the Pritikin label reading guidelines presented in Pritikin's Label Reading Educational series video. Using fool labels brought in from local grocery stores and markets, patients will apply the label reading guidelines and determine if the packaged food meet the Pritikin guidelines. The purpose of this lesson is to provide patients with the opportunity to review, discuss, and practice hands-on learning of the Pritikin Label Reading guidelines with actual packaged food labels. Cooking School  Pritikin's [employer] are designed to teach patients ways to prepare quick, simple, and affordable recipes at home. The importance of nutrition's role in chronic disease risk reduction is reflected in its emphasis in the overall Pritikin program. By learning how to prepare  essential core Pritikin Eating Plan recipes, patients will increase control over what they eat; be able to customize the flavor of foods without the use of added salt, sugar, or fat; and improve the quality of the food they consume. By learning a set of core recipes which are easily assembled, quickly prepared, and affordable, patients are more likely to prepare more healthy foods at home. These workshops focus on convenient breakfasts, simple entres, side dishes, and desserts which can be prepared with minimal effort and are consistent with nutrition recommendations for cardiovascular risk reduction. Cooking [employer] are taught by a Armed forces logistics/support/administrative officer (RD) who has been trained by the [employer]. The chef or RD has a clear understanding of the importance of minimizing - if not completely eliminating - added fat, sugar, and sodium in recipes. Throughout the series of Cooking School Workshop sessions, patients will learn about healthy ingredients and efficient methods of cooking to build confidence in their capability to prepare    Cooking School weekly topics:  Adding Flavor- Sodium-Free  Fast and Healthy Breakfasts  Powerhouse Plant-Based Proteins  Satisfying Salads and Dressings  Simple Sides and Sauces  International Cuisine-Spotlight on the [employer] Zones  Delicious Desserts  Savory Soups  [employer] - Meals in a Astronomer Appetizers and Snacks  Comforting Weekend Breakfasts  One-Pot Wonders   Fast Evening Meals  Landscape architect Your Pritikin Plate  WORKSHOPS   Healthy Mindset (Psychosocial):  Focused Goals, Sustainable Changes Clinical staff led group instruction and group discussion with PowerPoint presentation and patient guidebook. To enhance the learning environment the use of posters, models and videos may be added. Patients will be able to apply effective goal setting strategies to establish at least one personal goal, and  then take consistent, meaningful action toward that goal. They will learn to identify common barriers to achieving personal goals and develop strategies to overcome them. Patients will also gain an understanding of how our mind-set can impact our ability to achieve goals and the importance of cultivating a positive and growth-oriented mind-set. The purpose of this lesson is to provide patients with a deeper understanding of how to set and achieve personal goals, as well as the tools and strategies needed to overcome common obstacles which may arise along the way.  From Head to Heart:  The Power of a Healthy Outlook  Clinical staff led group instruction and group discussion with PowerPoint presentation and patient guidebook. To enhance the learning environment the use of posters, models and videos may be added. Patients will be able to recognize and describe the impact of emotions and mood on physical health. They will discover the importance of self-care and explore self-care practices which may work for them. Patients will also learn how to utilize the 4 C's to cultivate a healthier outlook and better manage stress and challenges. The purpose of this lesson is to demonstrate to patients how a healthy outlook is an essential part of maintaining good health, especially as they continue their cardiac rehab journey.  Healthy Sleep for a Healthy Heart Clinical staff led group instruction and group discussion with PowerPoint presentation and patient guidebook. To enhance the learning environment the use of posters, models and videos may be added. At the conclusion of this workshop, patients will be able to demonstrate knowledge of the importance of sleep to overall health, well-being, and quality of life. They will understand the symptoms of, and treatments for, common sleep disorders. Patients will also be able to identify daytime and nighttime behaviors which impact sleep, and they will be able to apply these  tools to help manage sleep-related challenges. The purpose of this lesson is to provide patients with a general overview of sleep and outline the importance of quality sleep. Patients will learn about a few of the most common sleep disorders. Patients will also be introduced to the concept of "sleep hygiene," and discover ways to self-manage certain sleeping problems through simple daily behavior changes. Finally, the workshop will motivate patients by clarifying the links between quality sleep and their goals of heart-healthy living.   Recognizing and Reducing Stress Clinical staff led group instruction and group discussion with PowerPoint presentation and patient guidebook. To enhance the learning environment the use of posters, models and videos may be added. At the conclusion of this workshop, patients will be able to understand the types of stress reactions, differentiate between acute and chronic stress, and recognize the impact that chronic stress has on their health. They will also be able to apply different coping mechanisms, such as reframing negative self-talk. Patients will have the opportunity to practice a variety of stress management techniques, such as deep abdominal breathing, progressive muscle relaxation, and/or guided imagery.  The purpose of this lesson is to educate patients on the role of stress in their lives and to provide healthy techniques for coping with it.  Learning Barriers/Preferences:  Learning Barriers/Preferences - 12/29/23 1346       Learning Barriers/Preferences   Learning Barriers Exercise Concerns    Learning Preferences Computer/Internet;Audio;Group Instruction;Individual Instruction;Pictoral;Skilled Demonstration;Verbal Instruction;Video;Written Material          Education Topics:  Knowledge Questionnaire Score:  Knowledge Questionnaire Score - 12/29/23 1342       Knowledge Questionnaire Score   Pre Score 20/24          Core Components/Risk  Factors/Patient Goals at Admission:  Personal Goals and Risk Factors at Admission - 12/29/23 1344       Core Components/Risk Factors/Patient Goals on Admission    Weight Management Yes;Obesity;Weight Loss    Intervention Weight Management: Develop a combined nutrition and exercise program designed to reach desired caloric intake, while maintaining appropriate intake of nutrient and fiber, sodium and fats, and appropriate energy expenditure required for the weight goal.;Weight Management: Provide education and appropriate resources to help participant work  on and attain dietary goals.;Weight Management/Obesity: Establish reasonable short term and long term weight goals.;Obesity: Provide education and appropriate resources to help participant work on and attain dietary goals.    Admit Weight 281 lb 12 oz (127.8 kg)    Goal Weight: Long Term 271 lb (122.9 kg)    Expected Outcomes Short Term: Continue to assess and modify interventions until short term weight is achieved;Long Term: Adherence to nutrition and physical activity/exercise program aimed toward attainment of established weight goal;Weight Loss: Understanding of general recommendations for a balanced deficit meal plan, which promotes 1-2 lb weight loss per week and includes a negative energy balance of [id number] kcal/d;Understanding recommendations for meals to include 15-35% energy as protein, 25-35% energy from fat, 35-60% energy from carbohydrates, less than 200mg  of dietary cholesterol, 20-35 gm of total fiber daily;Understanding of distribution of calorie intake throughout the day with the consumption of 4-5 meals/snacks    Hypertension Yes    Intervention Provide education on lifestyle modifcations including regular physical activity/exercise, weight management, moderate sodium restriction and increased consumption of fresh fruit, vegetables, and low fat dairy, alcohol  moderation, and smoking cessation.;Monitor prescription use compliance.     Expected Outcomes Short Term: Continued assessment and intervention until BP is < 140/69mm HG in hypertensive participants. < 130/10mm HG in hypertensive participants with diabetes, heart failure or chronic kidney disease.;Long Term: Maintenance of blood pressure at goal levels.    Lipids Yes    Intervention Provide education and support for participant on nutrition & aerobic/resistive exercise along with prescribed medications to achieve LDL 70mg , HDL >40mg .    Expected Outcomes Short Term: Participant states understanding of desired cholesterol values and is compliant with medications prescribed. Participant is following exercise prescription and nutrition guidelines.;Long Term: Cholesterol controlled with medications as prescribed, with individualized exercise RX and with personalized nutrition plan. Value goals: LDL < 70mg , HDL > 40 mg.    Stress Yes    Intervention Offer individual and/or small group education and counseling on adjustment to heart disease, stress management and health-related lifestyle change. Teach and support self-help strategies.;Refer participants experiencing significant psychosocial distress to appropriate mental health specialists for further evaluation and treatment. When possible, include family members and significant others in education/counseling sessions.    Expected Outcomes Short Term: Participant demonstrates changes in health-related behavior, relaxation and other stress management skills, ability to obtain effective social support, and compliance with psychotropic medications if prescribed.;Long Term: Emotional wellbeing is indicated by absence of clinically significant psychosocial distress or social isolation.          Core Components/Risk Factors/Patient Goals Review:    Core Components/Risk Factors/Patient Goals at Discharge (Final Review):    ITP Comments:  ITP Comments     Row Name 12/29/23 1206           ITP Comments Introduction to [facility]  Education Program/Intensive Cardiac Rehab. Initial Orientation Packet reviewed with the patient          Comments: Participant attended orientation for the cardiac rehabilitation program on  12/29/2023  to perform initial intake and exercise walk test. Patient introduced to the [facility] education and orientation packet was reviewed. Completed 6-minute walk test, measurements, initial ITP, and exercise prescription. Vital signs stable. Telemetry-normal sinus rhythm, symptomatic. Patient experienced and reported chest tightness during 6 minute walk test. See previous documentation as patient was taken to the treatment room 12 lead ECG was obtained onsite provider notified. Chest tightness resolved after rest and oxygen administration. Will hold starting exercise until  cleared by [facility] cardiology. Hadassah Elpidio Quan RN BSN   Service time was from 1032 to 1311.

## 2024-01-02 ENCOUNTER — Encounter (HOSPITAL_COMMUNITY): Payer: MEDICAID

## 2024-01-04 ENCOUNTER — Encounter (HOSPITAL_COMMUNITY): Payer: MEDICAID

## 2024-01-05 NOTE — Progress Notes (Signed)
 Cardiology Office Note:    Date:  01/12/2024   ID:  [name], DOB 10-May-1980, MRN [id number]  PCP:  [name], PA  Cardiologist:  Newman JINNY [name], MD     Referring MD: [name], [place]   Chief Complaint: follow-up of chest pain   History of Present Illness:    [name] is a 43 y.o. female with a history of history of CAD with NSTEMI in 04/2021 s/p PTCA/ DES to proximal LCX and then recent PTCA of in-stent restenosis of LCX and PTCA of ostial OM1, hypertension, hyperlipidemia, GERD, anemia, and anxiety/ depression who is followed by Dr. [name] and presents today for follow-up of chest pain.   Patient has a history of CAD with prior NSTEMI in 04/2021.  Echo at that time showed LVEF of 55 that was 80% with mild hypokinesis of the basal inferolateral wall and no significant valvular disease.  LHC showed 80% stenosis of proximal LCx and 70% stenoses of both the OM1 and OM 3 branches.  She underwent successful PCI with PTCA/DES to the LCx lesion at that time.  Subsequent Myoview in 08/2021 for recurrent chest pain showed no evidence of ischemia.  He was lost to follow-up for couple years but saw Dr. [name] in 10/2023 at which time she reported chest pain that she described as someone squeezing her heart both at rest and with exertion although it was worse with exertion.  Echo and outpatient cardiac catheterization were arranged.  Echo showed LVEF of 55-60% with normal wall motion, normal RV function, and mild AI.  LHC on 11/02/2023 showed 80% in-stent restenosis of LCx was a large trifurcating OM1 with 70% ostial stenosis.  She underwent successful PTCA with a drug-coated balloon to the LCx lesion and PTCA to the ostial OM1 lesion.  There was suspicion for a small thrombus at the ostium of OM1; therefore, further intervention was not performed in an effort to avoid further manipulation of the vessel.  She was placed on Aggrastat  for 6 hours and monitored overnight.    She was last seen by me on 12/29/2023 after having chest pain while at cardiac rehab orientation. She reported some occasional sharp chest pain as well as some chest tightness. Overall symptoms had improved since PCI but she was still having 2-3 episodes per week. She was started on Imdur  15mg  daily. BP was on the softer side so Lisinopril  was stopped to give room for Imdur . She also reported intermittent palpitations that she described as heart racing for a couple of minutes at time. 2 week monitor was ordered for further evaluation of this.   Patient presents today for follow-up. She is till having some occasional sharp pinpoint chest pain that last for at most 2 minutes but states it is getting better. She states she is only having this about once a week. However, she denies any recurrent chest tightness and no chest pain like what she was having prior to recent PCI. She reports mild dyspnea on exertion when going upstairs or when walking for longer periods. This is unchanged and is stable. She reports some difficult breathing at night which she attributes to her sleep apnea - it does not sounds like true orthopnea or PND. No edema. Palpitations have improved but she reports some worsening lightheadedness/ dizziness with positions changes as well as worsening headaches with the Imdur . No syncope.   She reports abnormal menstrual bleeding.  She states her current period has lasted one month and  she is still having heavy bleeding with some clots.  She is wondering if this could be due to the Imdur .  She is still wearing her Zio monitor. However, the monitor is not in contact with her skin and she states it has had an intermittent red blinking light which suggests it is not functioning appropriately.  EKGs/Labs/Other Studies Reviewed:    The following studies were reviewed:  Monitor 12/11/2019 to 12/19/2019: Dominant rhythm: Sinus. HR 52-174 bpm. Avg HR 98 bpm. No SVT/VT 3% isolated SVE, <1%  couplet, notriplets. <1% isolated VE, couplet, no triplets. No atrial fibrillation/atrial flutter/SVT/VT/high grade AV block, sinus pause >3sec noted. 5 patient triggered events, correlate with supraventricular ectopy.  _______________   Echocardiogram 10/28/2023: Impressions:  1. Left ventricular ejection fraction, by estimation, is 55 to 60%. The  left ventricle has normal function. The left ventricle has no regional  wall motion abnormalities. Left ventricular diastolic parameters were  normal.   2. Right ventricular systolic function is normal. The right ventricular  size is normal. Tricuspid regurgitation signal is inadequate for assessing  PA pressure.   3. The mitral valve is grossly normal. Trivial mitral valve  regurgitation. No evidence of mitral stenosis.   4. The aortic valve is tricuspid. Aortic valve regurgitation is mild. No  aortic stenosis is present.   5. The inferior vena cava is normal in size with greater than 50%  respiratory variability, suggesting right atrial pressure of 3 mmHg.  _______________   Left Cardiac Catheterization 11/02/2023: LM: Normal LAD: No significant disease Lcx: Large, dominant         Prox stent with 80% restenosis         Large trifurcating OM1 with ostial 70% stenosis RCA: Small, non-dominant vessel. No significant disease   LVEDP 23 mmHg   Successful percutaneous coronary intervention prox Lcx/OM      Intravascular ultrasound (IVUS)      DCB PTCA prox Lcx 3.0 X 30 mm Agent drug coated balloon      PTCA OM1 with 3.5 X 8 mm balloon      Suspicion for small thrombus at ostium of OM1.  Given TIMI-3 flow, no chest pain, no EKG changes, and pressures of DCB and left circumflex stent, I wanted to avoid further manipulation of the vessel.  Therefore, I did not perform any further intervention, including IVUS.  I will place the patient on 6 hours of Aggrastat  and monitor overnight.   Patient was previously on aspirin  and Brilinta .  I will  continue the same for another 1 year. Will make sure that patient's Repatha  is refilled, and emphasize compliance with   Unusually delayed procedure time due to use of multiple wires, abrupt vessel closure necessitating additional treatment to OM1 vessel, and eventually was after coated balloon.  EKG:  EKG not ordered today.   Recent Labs: 11/03/2023: BUN 15; Creatinine, Ser 1.00; Potassium 3.9; Sodium 137 11/18/2023: Hemoglobin 10.7; Platelets 265  Recent Lipid Panel    Component Value Date/Time   CHOL 169 10/27/2023 1547   TRIG 114 10/27/2023 1547   HDL 55 10/27/2023 1547   CHOLHDL 3.1 10/27/2023 1547   CHOLHDL 5.2 04/23/2021 0638   VLDL 16 04/23/2021 0638   LDLCALC 94 10/27/2023 1547   LDLDIRECT 172.9 (H) 04/23/2021 0638    Physical Exam:    Vital Signs: BP 96/62   Pulse 90   Ht 5' 3 (1.6 m)   Wt 278 lb (126.1 kg)   SpO2 97%   BMI 49.25  kg/m     Orthostatic VS for the past 24 hrs:  BP- Lying Pulse- Lying BP- Sitting Pulse- Sitting BP- Standing at 0 minutes Pulse- Standing at 0 minutes  01/12/24 1141 106/72 86 110/79 85 115/77 98      Wt Readings from Last 3 Encounters:  01/12/24 278 lb (126.1 kg)  12/29/23 281 lb 12 oz (127.8 kg)  12/29/23 281 lb (127.5 kg)     General: 43 y.o. obese African-American female in no acute distress. HEENT: Normocephalic and atraumatic. Sclera clear.  Neck: Supple. No JVD. Heart:  RRR. Distinct S1 and S2. No murmurs, gallops, or rubs.  Lungs: No increased work of breathing. Clear to ausculation bilaterally. No wheezes, rhonchi, or rales.  Extremities: No lower extremity edema.   Skin: Warm and dry. Neuro: No focal deficits. Psych: Normal affect. Responds appropriately.  Assessment:    1. Atypical chest pain   2. Coronary artery disease involving native coronary artery of native heart without angina pectoris   3. Palpitations   4. Primary hypertension   5. Hyperlipidemia, unspecified hyperlipidemia type   6. Obstructive  sleep apnea   7. Abnormal bleeding in menstrual cycle     Plan:    Atypical Chest Pain CAD History of NSTEMI in 2023 s/p DES to LCx and then recent PTCA to in-stent restenosis of LCx as well as PTCA to ostial OM1 in 10/2023.  She has had some continued intermittent chest pain since most recent PCI but it is overall improved.  - She was doing better since last visit.  She still describes occasional episodes of atypical sharp pinpoint chest pain that occur about once a week and last for up to 2 minutes but this does not sound like angina.  She denies any pain like what she had prior to recent PCI. - Will stop Imdur  given worsening headaches and lightheadedness/dizziness consistent with orthostasis.   - Continue DAPT with Aspirin  81 mg daily and Brilinta  90 mg twice daily.  - Continue Crestor  20mg  daily and Repatha .  - Patient to notify us  if she has any new or worsening symptoms. May need to consider switching Diltiazem  to Amlodipine or adding Ranexa if she has worsening symptoms. - Okay to resume Cardiac Rehab.   Palpitations She has a history of palpitations. Prior monitor in 2021 showed PACs (3% burden) but no significant arrhythmias. At last visit, she reported intermittent palpitations that she described as heart racing for a couple of minutes at a time. Zio monitor was ordered and is still pending. - Palpitations have improved since last visit. - Continue Diltiazem  120 mg daily and Lopressor  12.5 mg twice daily.  - She is still wearing her Zio monitor but it does not sound like it is functioning appropriately.  She was advised to call the company and have them send her a new monitor so that she does not get charged twice.  Hypertension BP soft in the office at 96/62.  She also describes worsening lightheadedness/dizziness with position changes that sounds like orthostasis. - Orthostatic vital signs negative in the office today. - Current medications: Diltiazem  120mg  daily, Lopressor   12.5mg  twice daily, and Imdur  15mg  daily.  Will stop Imdur  as above. - Recommended staying well-hydrated, changing positions slowly, and and wearing compression stockings help with orthostasis. - Suspect her home BP cuff is not accurate. Home readings are consistently higher than what we typically get in the office.  Recommended she purchase a new BP cuff.  Hyperlipidemia Lipid panel in 10/2023: Total  Cholesterol 169, Triglycerides 114, HDL 55, LDL 94. Lipoprotein (a) 199 in 10/2023.  - Continue Crestor  20mg  daily.  - Continue Repatha . This was restarted around time of last lipid check after she had been off of this for a few months.  - Will repeat lipid panel and LFTs. She is not fasting today so she will return for these.  Obstructive Sleep Apnea Patient reports previously being diagnosed with obstructive sleep apnea but has not been on CPAP before.  She described loud snoring, apneic episodes, and fatigue at visit last month.  Her PCP is actively working on referring her to a sleep specialist.  Abnormal Menstrual Bleeding Patient reports her current period has lasted one month and she is still having heavy bleeding with some clots.   - Patient is wondering if this could be due to the Imdur .  I do not think this is the cause but we are stopping this anyway due to worsening headaches and lightheadedness/dizziness since starting it.  She is on DAPT with aspirin  and Brilinta  but has been on this since her MI in 2023 and we are not able to safely stop this given recent PCI in 10/2023. - Will check a CBC today.  - Advised patient to follow up with her gynecologist.  Disposition: Patient already has an appointment with Dr. Bruna Rao in 02/2024.  Will keep this.   Signed, Aline FORBES Door, PA-C  01/12/2024 1:32 PM    [facility]

## 2024-01-05 NOTE — Progress Notes (Deleted)
 Cardiology Office Note:    Date:  01/05/2024   ID:  [name], DOB Nov 09, 1980, MRN [id number]  PCP:  [name], PA  Cardiologist:  Newman JINNY [name], MD { Click to update primary MD,subspecialty MD or APP then REFRESH:1}    Referring MD: [name], PA   Chief Complaint: follow-up of chest pain   History of Present Illness:    [name] is a 43 y.o. female with a history of history of CAD with NSTEMI in 04/2021 s/p PTCA/ DES to proximal LCX and then recent PTCA of in-stent restenosis of LCX and PTCA of ostial OM1, hypertension, hyperlipidemia, GERD, anemia, and anxiety/ depression who is followed by Dr. [name] and presents today for follow-up of chest pain.   Patient has a history of CAD with prior NSTEMI in 04/2021.  Echo at that time showed LVEF of 55 that was 80% with mild hypokinesis of the basal inferolateral wall and no significant valvular disease.  LHC showed 80% stenosis of proximal LCx and 70% stenoses of both the OM1 and OM 3 branches.  She underwent successful PCI with PTCA/DES to the LCx lesion at that time.  Subsequent Myoview in 08/2021 for recurrent chest pain showed no evidence of ischemia.  He was lost to follow-up for couple years but saw Dr. [name] in 10/2023 at which time she reported chest pain that she described as someone squeezing her heart both at rest and with exertion although it was worse with exertion.  Echo and outpatient cardiac catheterization were arranged.  Echo showed LVEF of 55-60% with normal wall motion, normal RV function, and mild AI.  LHC on 11/02/2023 showed 80% in-stent restenosis of LCx was a large trifurcating OM1 with 70% ostial stenosis.  She underwent successful PTCA with a drug-coated balloon to the LCx lesion and PTCA to the ostial OM1 lesion.  There was suspicion for a small thrombus at the ostium of OM1; therefore, further intervention was not performed in an effort to avoid further manipulation of the  vessel.  She was placed on Aggrastat  for 6 hours and monitored overnight.   She was last seen by me on 12/29/2023 after having chest pain while at cardiac rehab orientation. She reported some occasional sharp chest pain as well as some chest tightness. Overall symptoms had improved since PCI but she was still having 2-3 episodes per week. She was started on Imdur  15mg  daily. BP was on the softer side so Lisinopril  was stopped to give room for Imdur . She also reported intermittent palpitations that she described as heart racing for a couple of minutes at time. 2 week monitor was ordered for further evaluation of this.   Patient presents today for follow-up. ***  CAD History of NSTEMI in 2023 s/p DES to LCx and then recent PTCA to in-stent restenosis of LCx as well as PTCA to ostial OM1 in 10/2023.  She has had some continued intermittent chest pain since most recent PCI but it is overall improved.  - *** - Continue Imdur  15mg  daily.  - Continue DAPT with Aspirin  81 mg daily and Brilinta  90 mg twice daily.  - Continue Crestor  20mg  daily and Repatha .   Palpitations She has a history of palpitations. Prior monitor in 2021 showed PACs (3% burden) but no significant arrhythmias. At last visit, she reported intermittent palpitations that she described as heart racing for a couple of minutes at a time. Zio monitor was ordered.  - *** - Continue Diltiazem  120  mg daily and Lopressor  12.5 mg twice daily.  - Monitor ***  Hypertension BP *** - Continue current medications: Diltiazem  120mg  daily, Lopressor  12.5mg  twice daily, and Imdur  15mg  daily.   Hyperlipidemia Lipid panel in 10/2023: Total Cholesterol 169, Triglycerides 114, HDL 55, LDL 94. Lipoprotein (a) 199 in 10/2023.  - Continue Crestor  20mg  daily.  - Continue Repatha . This was restarted around time of last lipid check after she had been off of this for a few months.  - Will repeat lipid panel and LFTs. ***  Obstructive Sleep Apnea Patient  reports previously being diagnosed with obstructive sleep apnea but has not been on CPAP before.  She describes loud snoring, apneic episodes, and fatigue.  She has been referred to a provider at [facility] to help initiate CPAP but is still waiting to hear from them. ***   EKGs/Labs/Other Studies Reviewed:    The following studies were reviewed:  Monitor 12/11/2019 to 12/19/2019: Dominant rhythm: Sinus. HR 52-174 bpm. Avg HR 98 bpm. No SVT/VT 3% isolated SVE, <1% couplet, notriplets. <1% isolated VE, couplet, no triplets. No atrial fibrillation/atrial flutter/SVT/VT/high grade AV block, sinus pause >3sec noted. 5 patient triggered events, correlate with supraventricular ectopy.  _______________   Echocardiogram 10/28/2023: Impressions:  1. Left ventricular ejection fraction, by estimation, is 55 to 60%. The  left ventricle has normal function. The left ventricle has no regional  wall motion abnormalities. Left ventricular diastolic parameters were  normal.   2. Right ventricular systolic function is normal. The right ventricular  size is normal. Tricuspid regurgitation signal is inadequate for assessing  PA pressure.   3. The mitral valve is grossly normal. Trivial mitral valve  regurgitation. No evidence of mitral stenosis.   4. The aortic valve is tricuspid. Aortic valve regurgitation is mild. No  aortic stenosis is present.   5. The inferior vena cava is normal in size with greater than 50%  respiratory variability, suggesting right atrial pressure of 3 mmHg.  _______________   Left Cardiac Catheterization 11/02/2023: LM: Normal LAD: No significant disease Lcx: Large, dominant         Prox stent with 80% restenosis         Large trifurcating OM1 with ostial 70% stenosis RCA: Small, non-dominant vessel. No significant disease   LVEDP 23 mmHg   Successful percutaneous coronary intervention prox Lcx/OM      Intravascular ultrasound (IVUS)      DCB PTCA prox Lcx 3.0 X 30 mm  Agent drug coated balloon      PTCA OM1 with 3.5 X 8 mm balloon      Suspicion for small thrombus at ostium of OM1.  Given TIMI-3 flow, no chest pain, no EKG changes, and pressures of DCB and left circumflex stent, I wanted to avoid further manipulation of the vessel.  Therefore, I did not perform any further intervention, including IVUS.  I will place the patient on 6 hours of Aggrastat  and monitor overnight.   Patient was previously on aspirin  and Brilinta .  I will continue the same for another 1 year. Will make sure that patient's Repatha  is refilled, and emphasize compliance with   Unusually delayed procedure time due to use of multiple wires, abrupt vessel closure necessitating additional treatment to OM1 vessel, and eventually was after coated balloon.  EKG:  EKG not ordered today.   Recent Labs: 11/03/2023: BUN 15; Creatinine, Ser 1.00; Potassium 3.9; Sodium 137 11/18/2023: Hemoglobin 10.7; Platelets 265  Recent Lipid Panel    Component Value Date/Time  CHOL 169 10/27/2023 1547   TRIG 114 10/27/2023 1547   HDL 55 10/27/2023 1547   CHOLHDL 3.1 10/27/2023 1547   CHOLHDL 5.2 04/23/2021 0638   VLDL 16 04/23/2021 0638   LDLCALC 94 10/27/2023 1547   LDLDIRECT 172.9 (H) 04/23/2021 9361    Physical Exam:    Vital Signs: There were no vitals taken for this visit.    Wt Readings from Last 3 Encounters:  12/29/23 281 lb 12 oz (127.8 kg)  12/29/23 281 lb (127.5 kg)  11/09/23 285 lb 12.8 oz (129.6 kg)     General: 43 y.o. female in no acute distress. HEENT: Normocephalic and atraumatic. Sclera clear.  Neck: Supple. No carotid bruits. No JVD. Heart: *** RRR. Distinct S1 and S2. No murmurs, gallops, or rubs.  Lungs: No increased work of breathing. Clear to ausculation bilaterally. No wheezes, rhonchi, or rales.  Abdomen: Soft, non-distended, and non-tender to palpation.  Extremities: No lower extremity edema.  Radial and distal pedal pulses 2+ and equal bilaterally. Skin: Warm  and dry. Neuro: No focal deficits. Psych: Normal affect. Responds appropriately.   Assessment:    No diagnosis found.  Plan:     Disposition: Follow up in ***   Signed, Aline FORBES Door, PA-C  01/05/2024 12:14 PM     [facility]

## 2024-01-06 ENCOUNTER — Encounter (HOSPITAL_COMMUNITY): Payer: MEDICAID

## 2024-01-09 ENCOUNTER — Encounter (HOSPITAL_COMMUNITY): Admission: RE | Admit: 2024-01-09 | Payer: MEDICAID | Source: Ambulatory Visit

## 2024-01-09 NOTE — Progress Notes (Signed)
 Cardiac Individual Treatment Plan  Patient Details  Name: [name] MRN: [id number] Date of Birth: 1980/04/13 Referring Provider:   Flowsheet Row CARDIAC REHAB PHASE II ORIENTATION from 12/29/2023 in [facility]  Referring Provider Newman [name], MD    Initial Encounter Date:  Flowsheet Row CARDIAC REHAB PHASE II ORIENTATION from 12/29/2023 in [facility]  Date 12/29/23    Visit Diagnosis: 11/02/23 S/P PTCA CFX, OM1  Chest pain, unspecified type - Plan: EKG - 12 lead, EKG - 12 lead  Patient's Home Medications on Admission:  Current Outpatient Medications:    ARIPiprazole (ABILIFY) 5 MG tablet, Take 5 mg by mouth at bedtime., Disp: , Rfl:    aspirin  EC 81 MG tablet, Take 1 tablet (81 mg total) by mouth daily. Swallow whole., Disp: 90 tablet, Rfl: 3   diltiazem  (CARDIZEM  CD) 120 MG 24 hr capsule, Take 1 capsule (120 mg total) by mouth daily., Disp: 90 capsule, Rfl: 2   fluticasone (FLONASE) 50 MCG/ACT nasal spray, Place 2 sprays into both nostrils daily as needed for allergies., Disp: , Rfl:    linaclotide (LINZESS) 72 MCG capsule, Take 72 mcg by mouth daily as needed (for constipation)., Disp: , Rfl:    metoCLOPramide  (REGLAN ) 10 MG tablet, Take 10 mg by mouth every 8 (eight) hours as needed for nausea., Disp: , Rfl:    metoprolol  tartrate (LOPRESSOR ) 25 MG tablet, Take 1/2 tablet (12.5 mg total) by mouth 2 (two) times daily., Disp: 180 tablet, Rfl: 3   Multiple Vitamin (MULTIVITAMIN) capsule, Take 1 capsule by mouth daily with breakfast., Disp: , Rfl:    nitroGLYCERIN  (NITROSTAT ) 0.4 MG SL tablet, Place 1 tablet (0.4 mg total) under the tongue every 5 (five) minutes as needed., Disp: 25 tablet, Rfl: 3   nortriptyline (PAMELOR) 50 MG capsule, Take 50 mg by mouth at bedtime., Disp: , Rfl:    omeprazole  (PRILOSEC) 40 MG capsule, Take 40 mg by mouth daily.,  Disp: , Rfl:    promethazine  (PHENERGAN ) 25 MG tablet, Take 1 tablet (25 mg total) by mouth every 6 (six) hours as needed for nausea or vomiting., Disp: 30 tablet, Rfl: 0   rosuvastatin  (CRESTOR ) 20 MG tablet, Take 1 tablet (20 mg total) by mouth daily., Disp: 90 tablet, Rfl: 3   sertraline  (ZOLOFT ) 25 MG tablet, Take 25 mg by mouth at bedtime., Disp: , Rfl:    ticagrelor  (BRILINTA ) 90 MG TABS tablet, Take 1 tablet (90 mg total) by mouth 2 (two) times daily., Disp: 60 tablet, Rfl: 11   VENTOLIN HFA 108 (90 Base) MCG/ACT inhaler, Inhale 2 puffs into the lungs every 6 (six) hours as needed for wheezing or shortness of breath., Disp: , Rfl:    VISINE 0.025-0.3 % ophthalmic solution, Place 1 drop into both eyes daily as needed for eye irritation., Disp: , Rfl:    Evolocumab  (REPATHA  SURECLICK) 140 MG/ML SOAJ, Inject 140 mg into the skin every 14 (fourteen) days., Disp: 6 mL, Rfl: 3   isosorbide  mononitrate (IMDUR ) 30 MG 24 hr tablet, Take 0.5 tablets (15 mg total) by mouth at bedtime., Disp: 30 tablet, Rfl: 3  Past Medical History: Past Medical History:  Diagnosis Date   Acid reflux    Alcohol  abuse    Anemia    Coronary artery disease    Depression    GERD (gastroesophageal reflux disease)    H/O suicide attempt  Hyperlipidemia    Hypertension    Vaginal Pap smear, abnormal     Tobacco Use: Social History   Tobacco Use  Smoking Status Former   Current packs/day: 0.00   Average packs/day: 0.3 packs/day for 1 year (0.3 ttl pk-yrs)   Types: Cigarettes   Start date: 12/07/1998   Quit date: 12/07/1999   Years since quitting: 24.1  Smokeless Tobacco Never  Tobacco Comments   smoked 3-4 years    Labs: Review Flowsheet  More data exists      Latest Ref Rng & Units 04/22/2021 04/23/2021 06/05/2021 10/13/2021 10/27/2023  Labs for ITP Cardiac and Pulmonary Rehab  Cholestrol 100 - 199 mg/dL - 760  808  873  830   LDL (calc) 0 - 99 mg/dL - 822  882  44  94   Direct LDL 0 - 99 mg/dL - 827.0   - - -  HDL-C >39 mg/dL - 46  54  61  55   Trlycerides 0 - 149 mg/dL - 79  887  881  885   Hemoglobin A1c 4.8 - 5.6 % 6.0  - - - -  TCO2 22 - 32 mmol/L 23  - - - -    Capillary Blood Glucose: Lab Results  Component Value Date   GLUCAP 110 (H) 12/21/2013   GLUCAP 103 (H) 02/29/2008   GLUCAP 109 (H) 02/29/2008     Exercise Target Goals: Exercise Program Goal: Individual exercise prescription set using results from initial 6 min walk test and THRR while considering  patient's activity barriers and safety.   Exercise Prescription Goal: Initial exercise prescription builds to 30-45 minutes a day of aerobic activity, 2-3 days per week.  Home exercise guidelines will be given to patient during program as part of exercise prescription that the participant will acknowledge.  Activity Barriers & Risk Stratification:  Activity Barriers & Cardiac Risk Stratification - 12/29/23 1351       Activity Barriers & Cardiac Risk Stratification   Activity Barriers Balance Concerns;History of Falls;Deconditioning;Muscular Weakness;Chest Pain/Angina;Joint Problems;Arthritis    Cardiac Risk Stratification High          6 Minute Walk:  6 Minute Walk     Row Name 12/29/23 1347         6 Minute Walk   Phase Initial     Distance 840 feet     Walk Time 6 minutes     # of Rest Breaks 1  5:41-6:00 (chest tightness, dizziness)     MPH 1.6     METS 2.5     RPE 11     Perceived Dyspnea  0     VO2 Peak 8.75     Symptoms Yes (comment)     Comments Chest tightness (4/10), lightheadedness at end of walk. Pt vocies that it actually started around 3 minutes but did not stop and inform staff as requested in the explaination of the .     Resting HR 84 bpm     Resting BP 118/70     Resting Oxygen Saturation  96 %     Exercise Oxygen Saturation  during 6 min walk 100 %     Max Ex. HR 120 bpm     Max Ex. BP 120/70     2 Minute Post BP 118/72        Oxygen Initial Assessment:   Oxygen  Re-Evaluation:   Oxygen Discharge (Final Oxygen Re-Evaluation):   Initial Exercise Prescription:  Initial Exercise Prescription -  12/29/23 1300       Date of Initial Exercise RX and Referring Provider   Date 12/29/23    Referring Provider Newman [name], MD    Expected Discharge Date 02/08/24      NuStep   Level 1    SPM 75    Minutes 15    METs 2.5      Track   Laps 8    Minutes 15    METs 2.5      Prescription Details   Frequency (times per week) 3    Duration Progress to 30 minutes of continuous aerobic without signs/symptoms of physical distress      Intensity   THRR 40-80% of Max Heartrate 71-142    Ratings of Perceived Exertion 11-13    Perceived Dyspnea 0-4      Progression   Progression Continue progressive overload as per policy without signs/symptoms or physical distress.      Resistance Training   Training Prescription Yes    Weight 3lbs    Reps 10-15          Perform Capillary Blood Glucose checks as needed.  Exercise Prescription Changes:   Exercise Comments:   Exercise Goals and Review:   Exercise Goals     Row Name 12/29/23 1351             Exercise Goals   Increase Physical Activity Yes       Intervention Provide advice, education, support and counseling about physical activity/exercise needs.;Develop an individualized exercise prescription for aerobic and resistive training based on initial evaluation findings, risk stratification, comorbidities and participant's personal goals.       Expected Outcomes Long Term: Exercising regularly at least 3-5 days a week.;Short Term: Attend rehab on a regular basis to increase amount of physical activity.;Long Term: Add in home exercise to make exercise part of routine and to increase amount of physical activity.       Increase Strength and Stamina Yes       Intervention Provide advice, education, support and counseling about physical activity/exercise needs.;Develop an individualized  exercise prescription for aerobic and resistive training based on initial evaluation findings, risk stratification, comorbidities and participant's personal goals.       Expected Outcomes Short Term: Increase workloads from initial exercise prescription for resistance, speed, and METs.;Short Term: Perform resistance training exercises routinely during rehab and add in resistance training at home;Long Term: Improve cardiorespiratory fitness, muscular endurance and strength as measured by increased METs and functional capacity ( )       Able to understand and use rate of perceived exertion (RPE) scale Yes       Intervention Provide education and explanation on how to use RPE scale       Expected Outcomes Short Term: Able to use RPE daily in rehab to express subjective intensity level;Long Term:  Able to use RPE to guide intensity level when exercising independently       Knowledge and understanding of Target Heart Rate Range (THRR) Yes       Intervention Provide education and explanation of THRR including how the numbers were predicted and where they are located for reference       Expected Outcomes Short Term: Able to state/look up THRR;Long Term: Able to use THRR to govern intensity when exercising independently;Short Term: Able to use daily as guideline for intensity in rehab       Understanding of Exercise Prescription Yes       Intervention Provide  education, explanation, and written materials on patient's individual exercise prescription       Expected Outcomes Short Term: Able to explain program exercise prescription;Long Term: Able to explain home exercise prescription to exercise independently          Exercise Goals Re-Evaluation :   Discharge Exercise Prescription (Final Exercise Prescription Changes):   Nutrition:  Target Goals: Understanding of nutrition guidelines, daily intake of sodium 1500mg , cholesterol 200mg , calories 30% from fat and 7% or less from saturated fats, daily  to have 5 or more servings of fruits and vegetables.  Biometrics:  Pre Biometrics - 12/29/23 1100       Pre Biometrics   Waist Circumference 53.25 inches    Hip Circumference 57.5 inches    Waist to Hip Ratio 0.93 %    Triceps Skinfold 60 mm    % Body Fat 60.4 %    Grip Strength 32 kg    Flexibility 11 in    Single Leg Stand 12.8 seconds           Nutrition Therapy Plan and Nutrition Goals:   Nutrition Assessments:  MEDIFICTS Score Key: >=70 Need to make dietary changes  40-70 Heart Healthy Diet <= 40 Therapeutic Level Cholesterol Diet    Picture Your Plate Scores: <59 Unhealthy dietary pattern with much room for improvement. 41-50 Dietary pattern unlikely to meet recommendations for good health and room for improvement. 51-60 More healthful dietary pattern, with some room for improvement.  >60 Healthy dietary pattern, although there may be some specific behaviors that could be improved.    Nutrition Goals Re-Evaluation:   Nutrition Goals Re-Evaluation:   Nutrition Goals Discharge (Final Nutrition Goals Re-Evaluation):   Psychosocial: Target Goals: Acknowledge presence or absence of significant depression and/or stress, maximize coping skills, provide positive support system. Participant is able to verbalize types and ability to use techniques and skills needed for reducing stress and depression.  Initial Review & Psychosocial Screening:  Initial Psych Review & Screening - 12/29/23 1208       Initial Review   Current issues with History of Depression;Current Stress Concerns    Source of Stress Concerns Financial;Chronic Illness    Comments Tryniti has a history of depression. Janeese is currently taking anitdepressants. Heide is receiving counselling one a week. Tiah says she can get counseling more frequently if she needs to. 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 Darcee says she has financial stressor as she does  not have income coming in when she does not have clients.      Family Dynamics   Good Support System? Yes   Morene has her signifigant other Koren and her 3 children ages 40, 85 and 15 for support. Caryle says that she is her mother's caregiver.     Barriers   Psychosocial barriers to participate in program The patient should benefit from training in stress management and relaxation.      Screening Interventions   Interventions Encouraged to exercise;To provide support and resources with identified psychosocial needs;Provide feedback about the scores to participant    Expected Outcomes Long Term Goal: Stressors or current issues are controlled or eliminated.;Short Term goal: Identification and review with participant of any Quality of Life or Depression concerns found by scoring the questionnaire.;Long Term goal: The participant improves quality of Life and PHQ9 Scores as seen by post scores and/or verbalization of changes          Quality of Life Scores:  Quality of  Life - 12/29/23 1346       Quality of Life   Select Quality of Life      Quality of Life Scores   Health/Function Pre 25.6 %    Socioeconomic Pre 25.71 %    Psych/Spiritual Pre 26.57 %    Family Pre 24 %    GLOBAL Pre 25.59 %         Scores of 19 and below usually indicate a poorer quality of life in these areas.  A difference of  2-3 points is a clinically meaningful difference.  A difference of 2-3 points in the total score of the Quality of Life Index has been associated with significant improvement in overall quality of life, self-image, physical symptoms, and general health in studies assessing change in quality of life.  PHQ-9: Review Flowsheet  More data exists      12/29/2023 02/20/2014 01/23/2014 12/12/2013 11/28/2013  Depression screen PHQ 2/9  Decreased Interest 0 0 0 0 0  Down, Depressed, Hopeless 0 0 0 0 0  PHQ - 2 Score 0 0 0 0 0  Altered sleeping 3 - - - -  Tired, decreased energy 1 - - - -   Change in appetite 2 - - - -  Feeling bad or failure about yourself  1 - - - -  Trouble concentrating 1 - - - -  Moving slowly or fidgety/restless 0 - - - -  Suicidal thoughts 0 - - - -  PHQ-9 Score 8 - - - -  Difficult doing work/chores Not difficult at all - - - -   Interpretation of Total Score  Total Score Depression Severity:  1-4 = Minimal depression, 5-9 = Mild depression, 10-14 = Moderate depression, 15-19 = Moderately severe depression, 20-27 = Severe depression   Psychosocial Evaluation and Intervention:   Psychosocial Re-Evaluation:   Psychosocial Discharge (Final Psychosocial Re-Evaluation):   Vocational Rehabilitation: Provide vocational rehab assistance to qualifying candidates.   Vocational Rehab Evaluation & Intervention:  Vocational Rehab - 12/29/23 1216       Initial Vocational Rehab Evaluation & Intervention   Assessment shows need for Vocational Rehabilitation No   Dedria works and does not need vocational rehab at this time.         Education: Education Goals: Education classes will be provided on a weekly basis, covering required topics. Participant will state understanding/return demonstration of topics presented.     Core Videos: Exercise    Move It!  Clinical staff conducted group or individual video education with verbal and written material and guidebook.  Patient learns the recommended Pritikin exercise program. Exercise with the goal of living a long, healthy life. Some of the health benefits of exercise include controlled diabetes, healthier blood pressure levels, improved cholesterol levels, improved heart and lung capacity, improved sleep, and better body composition. Everyone should speak with their doctor before starting or changing an exercise routine.  Biomechanical Limitations Clinical staff conducted group or individual video education with verbal and written material and guidebook.  Patient learns how biomechanical limitations  can impact exercise and how we can mitigate and possibly overcome limitations to have an impactful and balanced exercise routine.  Body Composition Clinical staff conducted group or individual video education with verbal and written material and guidebook.  Patient learns that body composition (ratio of muscle mass to fat mass) is a key component to assessing overall fitness, rather than body weight alone. Increased fat mass, especially visceral belly fat, can put us  at  increased risk for metabolic syndrome, type 2 diabetes, heart disease, and even death. It is recommended to combine diet and exercise (cardiovascular and resistance training) to improve your body composition. Seek guidance from your physician and exercise physiologist before implementing an exercise routine.  Exercise Action Plan Clinical staff conducted group or individual video education with verbal and written material and guidebook.  Patient learns the recommended strategies to achieve and enjoy long-term exercise adherence, including variety, self-motivation, self-efficacy, and positive decision making. Benefits of exercise include fitness, good health, weight management, more energy, better sleep, less stress, and overall well-being.  Medical   Heart Disease Risk Reduction Clinical staff conducted group or individual video education with verbal and written material and guidebook.  Patient learns our heart is our most vital organ as it circulates oxygen, nutrients, white blood cells, and hormones throughout the entire body, and carries waste away. Data supports a plant-based eating plan like the Pritikin Program for its effectiveness in slowing progression of and reversing heart disease. The video provides a number of recommendations to address heart disease.   Metabolic Syndrome and Belly Fat  Clinical staff conducted group or individual video education with verbal and written material and guidebook.  Patient learns what  metabolic syndrome is, how it leads to heart disease, and how one can reverse it and keep it from coming back. You have metabolic syndrome if you have 3 of the following 5 criteria: abdominal obesity, high blood pressure, high triglycerides, low HDL cholesterol, and high blood sugar.  Hypertension and Heart Disease Clinical staff conducted group or individual video education with verbal and written material and guidebook.  Patient learns that high blood pressure, or hypertension, is very common in the [place] . Hypertension is largely due to excessive salt intake, but other important risk factors include being overweight, physical inactivity, drinking too much alcohol , smoking, and not eating enough potassium from fruits and vegetables. High blood pressure is a leading risk factor for heart attack, stroke, congestive heart failure, dementia, kidney failure, and premature death. Long-term effects of excessive salt intake include stiffening of the arteries and thickening of heart muscle and organ damage. Recommendations include ways to reduce hypertension and the risk of heart disease.  Diseases of Our Time - Focusing on Diabetes Clinical staff conducted group or individual video education with verbal and written material and guidebook.  Patient learns why the best way to stop diseases of our time is prevention, through food and other lifestyle changes. Medicine (such as prescription pills and surgeries) is often only a Band-Aid on the problem, not a long-term solution. Most common diseases of our time include obesity, type 2 diabetes, hypertension, heart disease, and cancer. The Pritikin Program is recommended and has been proven to help reduce, reverse, and/or prevent the damaging effects of metabolic syndrome.  Nutrition   Overview of the Pritikin Eating Plan  Clinical staff conducted group or individual video education with verbal and written material and guidebook.  Patient learns about the  Pritikin Eating Plan for disease risk reduction. The Pritikin Eating Plan emphasizes a wide variety of unrefined, minimally-processed carbohydrates, like fruits, vegetables, whole grains, and legumes. Go, Caution, and Stop food choices are explained. Plant-based and lean animal proteins are emphasized. Rationale provided for low sodium intake for blood pressure control, low added sugars for blood sugar stabilization, and low added fats and oils for coronary artery disease risk reduction and weight management.  Calorie Density  Clinical staff conducted group or individual video education with verbal  and written material and guidebook.  Patient learns about calorie density and how it impacts the Pritikin Eating Plan. Knowing the characteristics of the food you choose will help you decide whether those foods will lead to weight gain or weight loss, and whether you want to consume more or less of them. Weight loss is usually a side effect of the Pritikin Eating Plan because of its focus on low calorie-dense foods.  Label Reading  Clinical staff conducted group or individual video education with verbal and written material and guidebook.  Patient learns about the Pritikin recommended label reading guidelines and corresponding recommendations regarding calorie density, added sugars, sodium content, and whole grains.  Dining Out - Part 1  Clinical staff conducted group or individual video education with verbal and written material and guidebook.  Patient learns that restaurant meals can be sabotaging because they can be so high in calories, fat, sodium, and/or sugar. Patient learns recommended strategies on how to positively address this and avoid unhealthy pitfalls.  Facts on Fats  Clinical staff conducted group or individual video education with verbal and written material and guidebook.  Patient learns that lifestyle modifications can be just as effective, if not more so, as many medications for lowering  your risk of heart disease. A Pritikin lifestyle can help to reduce your risk of inflammation and atherosclerosis (cholesterol build-up, or plaque, in the artery walls). Lifestyle interventions such as dietary choices and physical activity address the cause of atherosclerosis. A review of the types of fats and their impact on blood cholesterol levels, along with dietary recommendations to reduce fat intake is also included.  Nutrition Action Plan  Clinical staff conducted group or individual video education with verbal and written material and guidebook.  Patient learns how to incorporate Pritikin recommendations into their lifestyle. Recommendations include planning and keeping personal health goals in mind as an important part of their success.  Healthy Mind-Set    Healthy Minds, Bodies, Hearts  Clinical staff conducted group or individual video education with verbal and written material and guidebook.  Patient learns how to identify when they are stressed. Video will discuss the impact of that stress, as well as the many benefits of stress management. Patient will also be introduced to stress management techniques. The way we think, act, and feel has an impact on our hearts.  How Our Thoughts Can Heal Our Hearts  Clinical staff conducted group or individual video education with verbal and written material and guidebook.  Patient learns that negative thoughts can cause depression and anxiety. This can result in negative lifestyle behavior and serious health problems. Cognitive behavioral therapy is an effective method to help control our thoughts in order to change and improve our emotional outlook.  Additional Videos:  Exercise    Improving Performance  Clinical staff conducted group or individual video education with verbal and written material and guidebook.  Patient learns to use a non-linear approach by alternating intensity levels and lengths of time spent exercising to help burn more  calories and lose more body fat. Cardiovascular exercise helps improve heart health, metabolism, hormonal balance, blood sugar control, and recovery from fatigue. Resistance training improves strength, endurance, balance, coordination, reaction time, metabolism, and muscle mass. Flexibility exercise improves circulation, posture, and balance. Seek guidance from your physician and exercise physiologist before implementing an exercise routine and learn your capabilities and proper form for all exercise.  Introduction to Yoga  Clinical staff conducted group or individual video education with verbal and written material  and guidebook.  Patient learns about yoga, a discipline of the coming together of mind, breath, and body. The benefits of yoga include improved flexibility, improved range of motion, better posture and core strength, increased lung function, weight loss, and positive self-image. Yoga's heart health benefits include lowered blood pressure, healthier heart rate, decreased cholesterol and triglyceride levels, improved immune function, and reduced stress. Seek guidance from your physician and exercise physiologist before implementing an exercise routine and learn your capabilities and proper form for all exercise.  Medical   Aging: Enhancing Your Quality of Life  Clinical staff conducted group or individual video education with verbal and written material and guidebook.  Patient learns key strategies and recommendations to stay in good physical health and enhance quality of life, such as prevention strategies, having an advocate, securing a Health Care Proxy and Power of Attorney, and keeping a list of medications and system for tracking them. It also discusses how to avoid risk for bone loss.  Biology of Weight Control  Clinical staff conducted group or individual video education with verbal and written material and guidebook.  Patient learns that weight gain occurs because we consume more  calories than we burn (eating more, moving less). Even if your body weight is normal, you may have higher ratios of fat compared to muscle mass. Too much body fat puts you at increased risk for cardiovascular disease, heart attack, stroke, type 2 diabetes, and obesity-related cancers. In addition to exercise, following the Pritikin Eating Plan can help reduce your risk.  Decoding Lab Results  Clinical staff conducted group or individual video education with verbal and written material and guidebook.  Patient learns that lab test reflects one measurement whose values change over time and are influenced by many factors, including medication, stress, sleep, exercise, food, hydration, pre-existing medical conditions, and more. It is recommended to use the knowledge from this video to become more involved with your lab results and evaluate your numbers to speak with your doctor.   Diseases of Our Time - Overview  Clinical staff conducted group or individual video education with verbal and written material and guidebook.  Patient learns that according to the CDC, 50% to 70% of chronic diseases (such as obesity, type 2 diabetes, elevated lipids, hypertension, and heart disease) are avoidable through lifestyle improvements including healthier food choices, listening to satiety cues, and increased physical activity.  Sleep Disorders Clinical staff conducted group or individual video education with verbal and written material and guidebook.  Patient learns how good quality and duration of sleep are important to overall health and well-being. Patient also learns about sleep disorders and how they impact health along with recommendations to address them, including discussing with a physician.  Nutrition  Dining Out - Part 2 Clinical staff conducted group or individual video education with verbal and written material and guidebook.  Patient learns how to plan ahead and communicate in order to maximize their  dining experience in a healthy and nutritious manner. Included are recommended food choices based on the type of restaurant the patient is visiting.   Fueling a Banker conducted group or individual video education with verbal and written material and guidebook.  There is a strong connection between our food choices and our health. Diseases like obesity and type 2 diabetes are very prevalent and are in large-part due to lifestyle choices. The Pritikin Eating Plan provides plenty of food and hunger-curbing satisfaction. It is easy to follow, affordable, and helps reduce health  risks.  Menu Workshop  Clinical staff conducted group or individual video education with verbal and written material and guidebook.  Patient learns that restaurant meals can sabotage health goals because they are often packed with calories, fat, sodium, and sugar. Recommendations include strategies to plan ahead and to communicate with the manager, chef, or server to help order a healthier meal.  Planning Your Eating Strategy  Clinical staff conducted group or individual video education with verbal and written material and guidebook.  Patient learns about the Pritikin Eating Plan and its benefit of reducing the risk of disease. The Pritikin Eating Plan does not focus on calories. Instead, it emphasizes high-quality, nutrient-rich foods. By knowing the characteristics of the foods, we choose, we can determine their calorie density and make informed decisions.  Targeting Your Nutrition Priorities  Clinical staff conducted group or individual video education with verbal and written material and guidebook.  Patient learns that lifestyle habits have a tremendous impact on disease risk and progression. This video provides eating and physical activity recommendations based on your personal health goals, such as reducing LDL cholesterol, losing weight, preventing or controlling type 2 diabetes, and reducing high  blood pressure.  Vitamins and Minerals  Clinical staff conducted group or individual video education with verbal and written material and guidebook.  Patient learns different ways to obtain key vitamins and minerals, including through a recommended healthy diet. It is important to discuss all supplements you take with your doctor.   Healthy Mind-Set    Smoking Cessation  Clinical staff conducted group or individual video education with verbal and written material and guidebook.  Patient learns that cigarette smoking and tobacco addiction pose a serious health risk which affects millions of people. Stopping smoking will significantly reduce the risk of heart disease, lung disease, and many forms of cancer. Recommended strategies for quitting are covered, including working with your doctor to develop a successful plan.  Culinary   Becoming a Set Designer conducted group or individual video education with verbal and written material and guidebook.  Patient learns that cooking at home can be healthy, cost-effective, quick, and puts them in control. Keys to cooking healthy recipes will include looking at your recipe, assessing your equipment needs, planning ahead, making it simple, choosing cost-effective seasonal ingredients, and limiting the use of added fats, salts, and sugars.  Cooking - Breakfast and Snacks  Clinical staff conducted group or individual video education with verbal and written material and guidebook.  Patient learns how important breakfast is to satiety and nutrition through the entire day. Recommendations include key foods to eat during breakfast to help stabilize blood sugar levels and to prevent overeating at meals later in the day. Planning ahead is also a key component.  Cooking - Educational Psychologist conducted group or individual video education with verbal and written material and guidebook.  Patient learns eating strategies to improve overall  health, including an approach to cook more at home. Recommendations include thinking of animal protein as a side on your plate rather than center stage and focusing instead on lower calorie dense options like vegetables, fruits, whole grains, and plant-based proteins, such as beans. Making sauces in large quantities to freeze for later and leaving the skin on your vegetables are also recommended to maximize your experience.  Cooking - Healthy Salads and Dressing Clinical staff conducted group or individual video education with verbal and written material and guidebook.  Patient learns that vegetables, fruits, whole grains,  and legumes are the foundations of the Pritikin Eating Plan. Recommendations include how to incorporate each of these in flavorful and healthy salads, and how to create homemade salad dressings. Proper handling of ingredients is also covered. Cooking - Soups and [employer] - Soups and Desserts Clinical staff conducted group or individual video education with verbal and written material and guidebook.  Patient learns that Pritikin soups and desserts make for easy, nutritious, and delicious snacks and meal components that are low in sodium, fat, sugar, and calorie density, while high in vitamins, minerals, and filling fiber. Recommendations include simple and healthy ideas for soups and desserts.   Overview     The Pritikin Solution Program Overview Clinical staff conducted group or individual video education with verbal and written material and guidebook.  Patient learns that the results of the Pritikin Program have been documented in more than 100 articles published in peer-reviewed journals, and the benefits include reducing risk factors for (and, in some cases, even reversing) high cholesterol, high blood pressure, type 2 diabetes, obesity, and more! An overview of the three key pillars of the Pritikin Program will be covered: eating well, doing regular exercise, and having a  healthy mind-set.  WORKSHOPS  Exercise: Exercise Basics: Building Your Action Plan Clinical staff led group instruction and group discussion with PowerPoint presentation and patient guidebook. To enhance the learning environment the use of posters, models and videos may be added. At the conclusion of this workshop, patients will comprehend the difference between physical activity and exercise, as well as the benefits of incorporating both, into their routine. Patients will understand the FITT (Frequency, Intensity, Time, and Type) principle and how to use it to build an exercise action plan. In addition, safety concerns and other considerations for exercise and cardiac rehab will be addressed by the presenter. The purpose of this lesson is to promote a comprehensive and effective weekly exercise routine in order to improve patients' overall level of fitness.   Managing Heart Disease: Your Path to a Healthier Heart Clinical staff led group instruction and group discussion with PowerPoint presentation and patient guidebook. To enhance the learning environment the use of posters, models and videos may be added.At the conclusion of this workshop, patients will understand the anatomy and physiology of the heart. Additionally, they will understand how Pritikin's three pillars impact the risk factors, the progression, and the management of heart disease.  The purpose of this lesson is to provide a high-level overview of the heart, heart disease, and how the Pritikin lifestyle positively impacts risk factors.  Exercise Biomechanics Clinical staff led group instruction and group discussion with PowerPoint presentation and patient guidebook. To enhance the learning environment the use of posters, models and videos may be added. Patients will learn how the structural parts of their bodies function and how these functions impact their daily activities, movement, and exercise. Patients will learn how to  promote a neutral spine, learn how to manage pain, and identify ways to improve their physical movement in order to promote healthy living. The purpose of this lesson is to expose patients to common physical limitations that impact physical activity. Participants will learn practical ways to adapt and manage aches and pains, and to minimize their effect on regular exercise. Patients will learn how to maintain good posture while sitting, walking, and lifting.  Balance Training and Fall Prevention  Clinical staff led group instruction and group discussion with PowerPoint presentation and patient guidebook. To enhance the learning environment the use  of posters, models and videos may be added. At the conclusion of this workshop, patients will understand the importance of their sensorimotor skills (vision, proprioception, and the vestibular system) in maintaining their ability to balance as they age. Patients will apply a variety of balancing exercises that are appropriate for their current level of function. Patients will understand the common causes for poor balance, possible solutions to these problems, and ways to modify their physical environment in order to minimize their fall risk. The purpose of this lesson is to teach patients about the importance of maintaining balance as they age and ways to minimize their risk of falling.  WORKSHOPS   Nutrition:  Fueling a Ship Broker led group instruction and group discussion with PowerPoint presentation and patient guidebook. To enhance the learning environment the use of posters, models and videos may be added. Patients will review the foundational principles of the Pritikin Eating Plan and understand what constitutes a serving size in each of the food groups. Patients will also learn Pritikin-friendly foods that are better choices when away from home and review make-ahead meal and snack options. Calorie density will be reviewed and  applied to three nutrition priorities: weight maintenance, weight loss, and weight gain. The purpose of this lesson is to reinforce (in a group setting) the key concepts around what patients are recommended to eat and how to apply these guidelines when away from home by planning and selecting Pritikin-friendly options. Patients will understand how calorie density may be adjusted for different weight management goals.  Mindful Eating  Clinical staff led group instruction and group discussion with PowerPoint presentation and patient guidebook. To enhance the learning environment the use of posters, models and videos may be added. Patients will briefly review the concepts of the Pritikin Eating Plan and the importance of low-calorie dense foods. The concept of mindful eating will be introduced as well as the importance of paying attention to internal hunger signals. Triggers for non-hunger eating and techniques for dealing with triggers will be explored. The purpose of this lesson is to provide patients with the opportunity to review the basic principles of the Pritikin Eating Plan, discuss the value of eating mindfully and how to measure internal cues of hunger and fullness using the Hunger Scale. Patients will also discuss reasons for non-hunger eating and learn strategies to use for controlling emotional eating.  Targeting Your Nutrition Priorities Clinical staff led group instruction and group discussion with PowerPoint presentation and patient guidebook. To enhance the learning environment the use of posters, models and videos may be added. Patients will learn how to determine their genetic susceptibility to disease by reviewing their family history. Patients will gain insight into the importance of diet as part of an overall healthy lifestyle in mitigating the impact of genetics and other environmental insults. The purpose of this lesson is to provide patients with the opportunity to assess their personal  nutrition priorities by looking at their family history, their own health history and current risk factors. Patients will also be able to discuss ways of prioritizing and modifying the Pritikin Eating Plan for their highest risk areas  Menu  Clinical staff led group instruction and group discussion with PowerPoint presentation and patient guidebook. To enhance the learning environment the use of posters, models and videos may be added. Using menus brought in from [employer], or printed from [employer], patients will apply the Pritikin dining out guidelines that were presented in the [employer] video. Patients  will also be able to practice these guidelines in a variety of provided scenarios. The purpose of this lesson is to provide patients with the opportunity to practice hands-on learning of the Pritikin Dining Out guidelines with actual menus and practice scenarios.  Label Reading Clinical staff led group instruction and group discussion with PowerPoint presentation and patient guidebook. To enhance the learning environment the use of posters, models and videos may be added. Patients will review and discuss the Pritikin label reading guidelines presented in Pritikin's Label Reading Educational series video. Using fool labels brought in from local grocery stores and markets, patients will apply the label reading guidelines and determine if the packaged food meet the Pritikin guidelines. The purpose of this lesson is to provide patients with the opportunity to review, discuss, and practice hands-on learning of the Pritikin Label Reading guidelines with actual packaged food labels. Cooking School  Pritikin's [employer] are designed to teach patients ways to prepare quick, simple, and affordable recipes at home. The importance of nutrition's role in chronic disease risk reduction is reflected in its emphasis in the overall Pritikin program. By learning how to prepare  essential core Pritikin Eating Plan recipes, patients will increase control over what they eat; be able to customize the flavor of foods without the use of added salt, sugar, or fat; and improve the quality of the food they consume. By learning a set of core recipes which are easily assembled, quickly prepared, and affordable, patients are more likely to prepare more healthy foods at home. These workshops focus on convenient breakfasts, simple entres, side dishes, and desserts which can be prepared with minimal effort and are consistent with nutrition recommendations for cardiovascular risk reduction. Cooking [employer] are taught by a armed forces logistics/support/administrative officer (RD) who has been trained by the [employer]. The chef or RD has a clear understanding of the importance of minimizing - if not completely eliminating - added fat, sugar, and sodium in recipes. Throughout the series of Cooking School Workshop sessions, patients will learn about healthy ingredients and efficient methods of cooking to build confidence in their capability to prepare    Cooking School weekly topics:  Adding Flavor- Sodium-Free  Fast and Healthy Breakfasts  Powerhouse Plant-Based Proteins  Satisfying Salads and Dressings  Simple Sides and Sauces  International Cuisine-Spotlight on the [employer] Zones  Delicious Desserts  Savory Soups  [employer] - Meals in a Astronomer Appetizers and Snacks  Comforting Weekend Breakfasts  One-Pot Wonders   Fast Evening Meals  Landscape Architect Your Pritikin Plate  WORKSHOPS   Healthy Mindset (Psychosocial):  Focused Goals, Sustainable Changes Clinical staff led group instruction and group discussion with PowerPoint presentation and patient guidebook. To enhance the learning environment the use of posters, models and videos may be added. Patients will be able to apply effective goal setting strategies to establish at least one personal goal, and  then take consistent, meaningful action toward that goal. They will learn to identify common barriers to achieving personal goals and develop strategies to overcome them. Patients will also gain an understanding of how our mind-set can impact our ability to achieve goals and the importance of cultivating a positive and growth-oriented mind-set. The purpose of this lesson is to provide patients with a deeper understanding of how to set and achieve personal goals, as well as the tools and strategies needed to overcome common obstacles which may arise along the way.  From Head to Heart:  The Power of a Healthy Outlook  Clinical staff led group instruction and group discussion with PowerPoint presentation and patient guidebook. To enhance the learning environment the use of posters, models and videos may be added. Patients will be able to recognize and describe the impact of emotions and mood on physical health. They will discover the importance of self-care and explore self-care practices which may work for them. Patients will also learn how to utilize the 4 C's to cultivate a healthier outlook and better manage stress and challenges. The purpose of this lesson is to demonstrate to patients how a healthy outlook is an essential part of maintaining good health, especially as they continue their cardiac rehab journey.  Healthy Sleep for a Healthy Heart Clinical staff led group instruction and group discussion with PowerPoint presentation and patient guidebook. To enhance the learning environment the use of posters, models and videos may be added. At the conclusion of this workshop, patients will be able to demonstrate knowledge of the importance of sleep to overall health, well-being, and quality of life. They will understand the symptoms of, and treatments for, common sleep disorders. Patients will also be able to identify daytime and nighttime behaviors which impact sleep, and they will be able to apply these  tools to help manage sleep-related challenges. The purpose of this lesson is to provide patients with a general overview of sleep and outline the importance of quality sleep. Patients will learn about a few of the most common sleep disorders. Patients will also be introduced to the concept of "sleep hygiene," and discover ways to self-manage certain sleeping problems through simple daily behavior changes. Finally, the workshop will motivate patients by clarifying the links between quality sleep and their goals of heart-healthy living.   Recognizing and Reducing Stress Clinical staff led group instruction and group discussion with PowerPoint presentation and patient guidebook. To enhance the learning environment the use of posters, models and videos may be added. At the conclusion of this workshop, patients will be able to understand the types of stress reactions, differentiate between acute and chronic stress, and recognize the impact that chronic stress has on their health. They will also be able to apply different coping mechanisms, such as reframing negative self-talk. Patients will have the opportunity to practice a variety of stress management techniques, such as deep abdominal breathing, progressive muscle relaxation, and/or guided imagery.  The purpose of this lesson is to educate patients on the role of stress in their lives and to provide healthy techniques for coping with it.  Learning Barriers/Preferences:  Learning Barriers/Preferences - 12/29/23 1346       Learning Barriers/Preferences   Learning Barriers Exercise Concerns    Learning Preferences Computer/Internet;Audio;Group Instruction;Individual Instruction;Pictoral;Skilled Demonstration;Verbal Instruction;Video;Written Material          Education Topics:  Knowledge Questionnaire Score:  Knowledge Questionnaire Score - 12/29/23 1342       Knowledge Questionnaire Score   Pre Score 20/24          Core Components/Risk  Factors/Patient Goals at Admission:  Personal Goals and Risk Factors at Admission - 12/29/23 1344       Core Components/Risk Factors/Patient Goals on Admission    Weight Management Yes;Obesity;Weight Loss    Intervention Weight Management: Develop a combined nutrition and exercise program designed to reach desired caloric intake, while maintaining appropriate intake of nutrient and fiber, sodium and fats, and appropriate energy expenditure required for the weight goal.;Weight Management: Provide education and appropriate resources to help participant work  on and attain dietary goals.;Weight Management/Obesity: Establish reasonable short term and long term weight goals.;Obesity: Provide education and appropriate resources to help participant work on and attain dietary goals.    Admit Weight 281 lb 12 oz (127.8 kg)    Goal Weight: Long Term 271 lb (122.9 kg)    Expected Outcomes Short Term: Continue to assess and modify interventions until short term weight is achieved;Long Term: Adherence to nutrition and physical activity/exercise program aimed toward attainment of established weight goal;Weight Loss: Understanding of general recommendations for a balanced deficit meal plan, which promotes 1-2 lb weight loss per week and includes a negative energy balance of [phone] kcal/d;Understanding recommendations for meals to include 15-35% energy as protein, 25-35% energy from fat, 35-60% energy from carbohydrates, less than 200mg  of dietary cholesterol, 20-35 gm of total fiber daily;Understanding of distribution of calorie intake throughout the day with the consumption of 4-5 meals/snacks    Hypertension Yes    Intervention Provide education on lifestyle modifcations including regular physical activity/exercise, weight management, moderate sodium restriction and increased consumption of fresh fruit, vegetables, and low fat dairy, alcohol  moderation, and smoking cessation.;Monitor prescription use compliance.     Expected Outcomes Short Term: Continued assessment and intervention until BP is < 140/31mm HG in hypertensive participants. < 130/18mm HG in hypertensive participants with diabetes, heart failure or chronic kidney disease.;Long Term: Maintenance of blood pressure at goal levels.    Lipids Yes    Intervention Provide education and support for participant on nutrition & aerobic/resistive exercise along with prescribed medications to achieve LDL 70mg , HDL >40mg .    Expected Outcomes Short Term: Participant states understanding of desired cholesterol values and is compliant with medications prescribed. Participant is following exercise prescription and nutrition guidelines.;Long Term: Cholesterol controlled with medications as prescribed, with individualized exercise RX and with personalized nutrition plan. Value goals: LDL < 70mg , HDL > 40 mg.    Stress Yes    Intervention Offer individual and/or small group education and counseling on adjustment to heart disease, stress management and health-related lifestyle change. Teach and support self-help strategies.;Refer participants experiencing significant psychosocial distress to appropriate mental health specialists for further evaluation and treatment. When possible, include family members and significant others in education/counseling sessions.    Expected Outcomes Short Term: Participant demonstrates changes in health-related behavior, relaxation and other stress management skills, ability to obtain effective social support, and compliance with psychotropic medications if prescribed.;Long Term: Emotional wellbeing is indicated by absence of clinically significant psychosocial distress or social isolation.          Core Components/Risk Factors/Patient Goals Review:    Core Components/Risk Factors/Patient Goals at Discharge (Final Review):    ITP Comments:  ITP Comments     Row Name 12/29/23 1206 01/09/24 1721         ITP Comments Introduction to  [facility]/Intensive Cardiac Rehab. Initial Orientation Packet reviewed with the patient 30 Day ITP Review. Carine attended cardiac rehab orientation on 12/29/23 and has not started exercise yet.         Comments: See ITP comments.Hadassah Elpidio Quan RN BSN

## 2024-01-11 ENCOUNTER — Encounter (HOSPITAL_COMMUNITY): Admission: RE | Admit: 2024-01-11 | Payer: MEDICAID

## 2024-01-12 ENCOUNTER — Ambulatory Visit: Payer: MEDICAID | Admitting: Cardiology

## 2024-01-12 ENCOUNTER — Ambulatory Visit: Payer: MEDICAID | Admitting: Student

## 2024-01-12 ENCOUNTER — Ambulatory Visit: Payer: MEDICAID | Attending: Student | Admitting: Student

## 2024-01-12 ENCOUNTER — Other Ambulatory Visit: Payer: Self-pay

## 2024-01-12 ENCOUNTER — Encounter: Payer: Self-pay | Admitting: Student

## 2024-01-12 VITALS — BP 96/62 | HR 90 | Ht 63.0 in | Wt 278.0 lb

## 2024-01-12 DIAGNOSIS — G4733 Obstructive sleep apnea (adult) (pediatric): Secondary | ICD-10-CM | POA: Insufficient documentation

## 2024-01-12 DIAGNOSIS — R0789 Other chest pain: Secondary | ICD-10-CM | POA: Diagnosis not present

## 2024-01-12 DIAGNOSIS — N926 Irregular menstruation, unspecified: Secondary | ICD-10-CM | POA: Diagnosis present

## 2024-01-12 DIAGNOSIS — I10 Essential (primary) hypertension: Secondary | ICD-10-CM | POA: Insufficient documentation

## 2024-01-12 DIAGNOSIS — I2510 Atherosclerotic heart disease of native coronary artery without angina pectoris: Secondary | ICD-10-CM

## 2024-01-12 DIAGNOSIS — R002 Palpitations: Secondary | ICD-10-CM | POA: Diagnosis present

## 2024-01-12 DIAGNOSIS — E785 Hyperlipidemia, unspecified: Secondary | ICD-10-CM | POA: Diagnosis present

## 2024-01-12 NOTE — Patient Instructions (Addendum)
 Medication Instructions:  STOP Imdur  (isosorbide ) *If you need a refill on your cardiac medications before your next appointment, please call your pharmacy*  Lab Work: [employer] AT YOUR CONVENIENCE FASTING LIPIDS & LFTS If you have labs (blood work) drawn today and your tests are completely normal, you will receive your results only by: MyChart Message (if you have MyChart) OR A paper copy in the mail If you have any lab test that is abnormal or we need to change your treatment, we will call you to review the results.  Testing/Procedures: NONE ORDERED  Follow-Up: At [facility], you and your health needs are our priority.  As part of our continuing mission to provide you with exceptional heart care, our providers are all part of one team.  This team includes your primary Cardiologist (physician) and Advanced Practice Providers or APPs (Physician Assistants and Nurse Practitioners) who all work together to provide you with the care you need, when you need it.  Your next appointment:    Follow up as scheduled   Provider:   Newman JINNY [name], MD   We recommend signing up for the patient portal called MyChart.  Sign up information is provided on this After Visit Summary.  MyChart is used to connect with patients for Virtual Visits (Telemedicine).  Patients are able to view lab/test results, encounter notes, upcoming appointments, etc.  Non-urgent messages can be sent to your provider as well.   To learn more about what you can do with MyChart, go to [email or web address].   Other Instructions FOLLOW UP WITH YOUR GYN about your bleeding

## 2024-01-13 ENCOUNTER — Encounter (HOSPITAL_COMMUNITY): Payer: MEDICAID

## 2024-01-13 ENCOUNTER — Other Ambulatory Visit (HOSPITAL_COMMUNITY): Payer: Self-pay

## 2024-01-13 ENCOUNTER — Telehealth (HOSPITAL_COMMUNITY): Payer: Self-pay | Admitting: *Deleted

## 2024-01-13 MED ORDER — OMRON 3 SERIES BP MONITOR DEVI
1.0000 | Freq: Every day | 0 refills | Status: AC
  Filled 2024-01-13: qty 1, 30d supply, fill #0

## 2024-01-13 NOTE — Telephone Encounter (Signed)
 Spoke with [name] she plans to begin exercise on Monday.Hadassah Elpidio Quan RN BSN

## 2024-01-13 NOTE — Addendum Note (Signed)
 Addended by: SEBASTIAN JANESE GRADE on: 01/13/2024 08:28 AM   Modules accepted: Orders

## 2024-01-16 ENCOUNTER — Encounter (HOSPITAL_COMMUNITY)
Admission: RE | Admit: 2024-01-16 | Discharge: 2024-01-16 | Disposition: A | Discharge status: Home / Self Care | Payer: MEDICAID | Source: Ambulatory Visit | Attending: Cardiology | Admitting: Cardiology

## 2024-01-16 DIAGNOSIS — Z9861 Coronary angioplasty status: Secondary | ICD-10-CM | POA: Insufficient documentation

## 2024-01-16 NOTE — Progress Notes (Signed)
 Daily Session Note  Patient Details  Name: [name] MRN: [id number] Date of Birth: 1981/01/19 Referring Provider:   Flowsheet Row CARDIAC REHAB PHASE II ORIENTATION from 12/29/2023 in [facility]  Referring Provider Newman [name], MD    Encounter Date: 01/16/2024  Check In:  Session Check In - 01/16/24 1007       Check-In   Supervising physician immediately available to respond to emergencies [facility] MD immediately available    Physician(s) Barnie Hila, NP    Location [facility]    Staff Present Hadassah Quan, RN, Mallory Parkins, MS, ACSM-CEP, CCRP, Exercise Physiologist;Jetta Vannie BS, ACSM-CEP, Exercise Physiologist;Olinty Valere, MS, ACSM-CEP, Exercise Physiologist;Joseph Lennon, RN, BSN    Virtual Visit No    Medication changes reported     No    Fall or balance concerns reported    No    Tobacco Cessation No Change    Current number of cigarettes/nicotine per day     0    Warm-up and Cool-down Performed as group-led instruction    Resistance Training Performed Yes    VAD Patient? No    PAD/SET Patient? No      Pain Assessment   Currently in Pain? No/denies    Pain Score 0-No pain    Multiple Pain Sites No          Capillary Blood Glucose: No results found for this or any previous visit (from the past 24 hours).   Exercise Prescription Changes - 01/16/24 1039       Response to Exercise   Blood Pressure (Admit) 102/68    Blood Pressure (Exercise) 108/72    Blood Pressure (Exit) 108/78    Heart Rate (Admit) 87 bpm    Heart Rate (Exercise) 111 bpm    Heart Rate (Exit) 96 bpm    Rating of Perceived Exertion (Exercise) 11    Symptoms None    Comments Off to a good start with exercise.    Duration Continue with 30 min of aerobic exercise without signs/symptoms of physical distress.    Intensity THRR unchanged      Progression   Progression Continue to progress  workloads to maintain intensity without signs/symptoms of physical distress.    Average METs 1.4      Resistance Training   Training Prescription Yes    Weight 3lbs    Reps 10-15    Time 5 Minutes      Interval Training   Interval Training No      NuStep   Level 1    SPM 64    Minutes 30    METs 1.4          Social History   Tobacco Use  Smoking Status Former   Current packs/day: 0.00   Average packs/day: 0.3 packs/day for 1 year (0.3 ttl pk-yrs)   Types: Cigarettes   Start date: 12/07/1998   Quit date: 12/07/1999   Years since quitting: 24.1  Smokeless Tobacco Never  Tobacco Comments   smoked 3-4 years    Goals Met:  Exercise tolerated well No report of concerns or symptoms today Strength training completed today  Goals Unmet:  Not Applicable  Comments: Pt started cardiac rehab today.  Pt tolerated light exercise without difficulty. VSS, telemetry-Sinus Rhythm, asymptomatic.  Medication list reconciled. Pt denies barriers to medicaiton compliance.  PSYCHOSOCIAL ASSESSMENT:  PHQ-8. Pt exhibits positive coping skills, hopeful outlook with supportive family. No psychosocial  needs identified at this time, no psychosocial interventions necessary.    Pt enjoys spending time with children,games, singing and poetry writing.   Pt oriented to exercise equipment and routine.    Understanding verbalized. Patient is deconditioned. Notes that took Imdur  had to stop due to complaints of a headache. Steffi did not have any reports of chest pain during exercise today. Hadassah Elpidio Quan RN BSN

## 2024-01-17 LAB — CBC
Hematocrit: 33.9 % — ABNORMAL LOW (ref 34.0–46.6)
Hemoglobin: 10.9 g/dL — ABNORMAL LOW (ref 11.1–15.9)
MCH: 28.7 pg (ref 26.6–33.0)
MCHC: 32.2 g/dL (ref 31.5–35.7)
MCV: 89 fL (ref 79–97)
Platelets: 344 x10E3/uL (ref 150–450)
RBC: 3.8 x10E6/uL (ref 3.77–5.28)
RDW: 13.3 % (ref 11.7–15.4)
WBC: 6.1 x10E3/uL (ref 3.4–10.8)

## 2024-01-17 LAB — LIPID PANEL
Chol/HDL Ratio: 1.9 ratio (ref 0.0–4.4)
Cholesterol, Total: 95 mg/dL — ABNORMAL LOW (ref 100–199)
HDL: 49 mg/dL (ref >39)
LDL Chol Calc (NIH): 31 mg/dL (ref 0–99)
Triglycerides: 70 mg/dL (ref 0–149)
VLDL Cholesterol Cal: 15 mg/dL (ref 5–40)

## 2024-01-17 LAB — HEPATIC FUNCTION PANEL
ALT: 24 IU/L (ref 0–32)
AST: 22 IU/L (ref 0–40)
Albumin: 4.1 g/dL (ref 3.9–4.9)
Alkaline Phosphatase: 85 IU/L (ref 41–116)
Bilirubin Total: <0.2 mg/dL (ref 0.0–1.2)
Bilirubin, Direct: <0.08 mg/dL (ref 0.00–0.40)
Total Protein: 6.8 g/dL (ref 6.0–8.5)

## 2024-01-18 ENCOUNTER — Encounter (HOSPITAL_COMMUNITY)
Admission: RE | Admit: 2024-01-18 | Discharge: 2024-01-18 | Disposition: A | Discharge status: Home / Self Care | Payer: MEDICAID | Source: Ambulatory Visit | Attending: Cardiology | Admitting: Cardiology

## 2024-01-18 ENCOUNTER — Ambulatory Visit: Payer: Self-pay | Admitting: Student

## 2024-01-18 VITALS — BP 98/70 | HR 89 | Wt 281.1 lb

## 2024-01-18 DIAGNOSIS — Z9861 Coronary angioplasty status: Secondary | ICD-10-CM | POA: Diagnosis not present

## 2024-01-18 NOTE — Telephone Encounter (Signed)
 Pt returning call regarding results. Please advise

## 2024-01-18 NOTE — Progress Notes (Signed)
 Left message for cb regarding lab results

## 2024-01-20 ENCOUNTER — Encounter (HOSPITAL_COMMUNITY): Admission: RE | Admit: 2024-01-20 | Payer: MEDICAID | Source: Ambulatory Visit

## 2024-01-23 ENCOUNTER — Encounter (HOSPITAL_COMMUNITY): Payer: MEDICAID

## 2024-01-24 DIAGNOSIS — R002 Palpitations: Secondary | ICD-10-CM

## 2024-01-25 ENCOUNTER — Encounter (HOSPITAL_COMMUNITY): Payer: MEDICAID

## 2024-01-25 ENCOUNTER — Telehealth (HOSPITAL_COMMUNITY): Payer: Self-pay

## 2024-01-25 NOTE — Telephone Encounter (Signed)
 Attempted to call patient regarding 3x no call, no shows in a row for 10:15 CR class- no answer, left message asking patient to call us  back.

## 2024-01-26 ENCOUNTER — Ambulatory Visit: Payer: Self-pay | Admitting: Student

## 2024-01-27 ENCOUNTER — Encounter (HOSPITAL_COMMUNITY): Payer: MEDICAID

## 2024-01-27 NOTE — Progress Notes (Signed)
 Spoke with patient regarding heart monitor results. She will monitor the bp and submit readings to mychart. She does not need assistance with mychart access. No other cardiac concerns at this time.

## 2024-01-30 ENCOUNTER — Encounter (HOSPITAL_COMMUNITY): Payer: MEDICAID

## 2024-02-01 ENCOUNTER — Encounter (HOSPITAL_COMMUNITY): Payer: Self-pay | Admitting: *Deleted

## 2024-02-01 ENCOUNTER — Encounter (HOSPITAL_COMMUNITY): Admission: RE | Admit: 2024-02-01 | Payer: MEDICAID | Source: Ambulatory Visit

## 2024-02-01 DIAGNOSIS — Z9861 Coronary angioplasty status: Secondary | ICD-10-CM

## 2024-02-01 NOTE — Progress Notes (Signed)
 Discharge Progress Report  Patient Details  Name: [name] MRN: [id number] Date of Birth: January 30, 1981 Referring Provider:   Flowsheet Row CARDIAC REHAB PHASE II ORIENTATION from 12/29/2023 in [facility]  Referring Provider Newman [name], MD     Number of Visits: 4  Reason for Discharge:  Early Exit:  Lack of attendance  Smoking History:  Social History   Tobacco Use  Smoking Status Former   Current packs/day: 0.00   Average packs/day: 0.3 packs/day for 1 year (0.3 ttl pk-yrs)   Types: Cigarettes   Start date: 12/07/1998   Quit date: 12/07/1999   Years since quitting: 24.1  Smokeless Tobacco Never  Tobacco Comments   smoked 3-4 years    Diagnosis:  No diagnosis found.  ADL UCSD:   Initial Exercise Prescription:  Initial Exercise Prescription - 12/29/23 1300       Date of Initial Exercise RX and Referring Provider   Date 12/29/23    Referring Provider Newman [name], MD    Expected Discharge Date 02/08/24      NuStep   Level 1    SPM 75    Minutes 15    METs 2.5      Track   Laps 8    Minutes 15    METs 2.5      Prescription Details   Frequency (times per week) 3    Duration Progress to 30 minutes of continuous aerobic without signs/symptoms of physical distress      Intensity   THRR 40-80% of Max Heartrate 71-142    Ratings of Perceived Exertion 11-13    Perceived Dyspnea 0-4      Progression   Progression Continue progressive overload as per policy without signs/symptoms or physical distress.      Resistance Training   Training Prescription Yes    Weight 3lbs    Reps 10-15          Discharge Exercise Prescription (Final Exercise Prescription Changes):  Exercise Prescription Changes - 01/16/24 1039       Response to Exercise   Blood Pressure (Admit) 102/68    Blood Pressure (Exercise) 108/72    Blood Pressure (Exit) 108/78    Heart Rate (Admit) 87 bpm    Heart  Rate (Exercise) 111 bpm    Heart Rate (Exit) 96 bpm    Rating of Perceived Exertion (Exercise) 11    Symptoms None    Comments Off to a good start with exercise.    Duration Continue with 30 min of aerobic exercise without signs/symptoms of physical distress.    Intensity THRR unchanged      Progression   Progression Continue to progress workloads to maintain intensity without signs/symptoms of physical distress.    Average METs 1.4      Resistance Training   Training Prescription Yes    Weight 3lbs    Reps 10-15    Time 5 Minutes      Interval Training   Interval Training No      NuStep   Level 1    SPM 64    Minutes 30    METs 1.4          Functional Capacity:  6 Minute Walk     Row Name 12/29/23 1347         6 Minute Walk   Phase Initial     Distance 840 feet     Walk Time 6 minutes     #  of Rest Breaks 1  5:41-6:00 (chest tightness, dizziness)     MPH 1.6     METS 2.5     RPE 11     Perceived Dyspnea  0     VO2 Peak 8.75     Symptoms Yes (comment)     Comments Chest tightness (4/10), lightheadedness at end of walk. Pt vocies that it actually started around 3 minutes but did not stop and inform staff as requested in the explaination of the .     Resting HR 84 bpm     Resting BP 118/70     Resting Oxygen Saturation  96 %     Exercise Oxygen Saturation  during 6 min walk 100 %     Max Ex. HR 120 bpm     Max Ex. BP 120/70     2 Minute Post BP 118/72        Psychological, QOL, Others - Outcomes: PHQ 2/9:    12/29/2023    1:56 PM 02/20/2014    8:28 AM 01/23/2014    9:05 AM 12/12/2013    8:42 AM 11/28/2013    8:49 AM  Depression screen PHQ 2/9  Decreased Interest 0 0 0 0 0  Down, Depressed, Hopeless 0 0 0 0 0  PHQ - 2 Score 0 0 0 0 0  Altered sleeping 3      Tired, decreased energy 1      Change in appetite 2      Feeling bad or failure about yourself  1      Trouble concentrating 1      Moving slowly or fidgety/restless 0      Suicidal  thoughts 0      PHQ-9 Score 8       Difficult doing work/chores Not difficult at all         Data saved with a previous flowsheet row definition    Quality of Life:  Quality of Life - 12/29/23 1346       Quality of Life   Select Quality of Life      Quality of Life Scores   Health/Function Pre 25.6 %    Socioeconomic Pre 25.71 %    Psych/Spiritual Pre 26.57 %    Family Pre 24 %    GLOBAL Pre 25.59 %          Personal Goals: Goals established at orientation with interventions provided to work toward goal.  Personal Goals and Risk Factors at Admission - 12/29/23 1344       Core Components/Risk Factors/Patient Goals on Admission    Weight Management Yes;Obesity;Weight Loss    Intervention Weight Management: Develop a combined nutrition and exercise program designed to reach desired caloric intake, while maintaining appropriate intake of nutrient and fiber, sodium and fats, and appropriate energy expenditure required for the weight goal.;Weight Management: Provide education and appropriate resources to help participant work on and attain dietary goals.;Weight Management/Obesity: Establish reasonable short term and long term weight goals.;Obesity: Provide education and appropriate resources to help participant work on and attain dietary goals.    Admit Weight 281 lb 12 oz (127.8 kg)    Goal Weight: Long Term 271 lb (122.9 kg)    Expected Outcomes Short Term: Continue to assess and modify interventions until short term weight is achieved;Long Term: Adherence to nutrition and physical activity/exercise program aimed toward attainment of established weight goal;Weight Loss: Understanding of general recommendations for a balanced deficit meal plan, which promotes 1-2  lb weight loss per week and includes a negative energy balance of [phone] kcal/d;Understanding recommendations for meals to include 15-35% energy as protein, 25-35% energy from fat, 35-60% energy from carbohydrates, less than  200mg  of dietary cholesterol, 20-35 gm of total fiber daily;Understanding of distribution of calorie intake throughout the day with the consumption of 4-5 meals/snacks    Hypertension Yes    Intervention Provide education on lifestyle modifcations including regular physical activity/exercise, weight management, moderate sodium restriction and increased consumption of fresh fruit, vegetables, and low fat dairy, alcohol  moderation, and smoking cessation.;Monitor prescription use compliance.    Expected Outcomes Short Term: Continued assessment and intervention until BP is < 140/53mm HG in hypertensive participants. < 130/97mm HG in hypertensive participants with diabetes, heart failure or chronic kidney disease.;Long Term: Maintenance of blood pressure at goal levels.    Lipids Yes    Intervention Provide education and support for participant on nutrition & aerobic/resistive exercise along with prescribed medications to achieve LDL 70mg , HDL >40mg .    Expected Outcomes Short Term: Participant states understanding of desired cholesterol values and is compliant with medications prescribed. Participant is following exercise prescription and nutrition guidelines.;Long Term: Cholesterol controlled with medications as prescribed, with individualized exercise RX and with personalized nutrition plan. Value goals: LDL < 70mg , HDL > 40 mg.    Stress Yes    Intervention Offer individual and/or small group education and counseling on adjustment to heart disease, stress management and health-related lifestyle change. Teach and support self-help strategies.;Refer participants experiencing significant psychosocial distress to appropriate mental health specialists for further evaluation and treatment. When possible, include family members and significant others in education/counseling sessions.    Expected Outcomes Short Term: Participant demonstrates changes in health-related behavior, relaxation and other stress  management skills, ability to obtain effective social support, and compliance with psychotropic medications if prescribed.;Long Term: Emotional wellbeing is indicated by absence of clinically significant psychosocial distress or social isolation.           Personal Goals Discharge:  Goals and Risk Factor Review     Row Name 01/16/24 1642             Core Components/Risk Factors/Patient Goals Review   Personal Goals Review Weight Management/Obesity;Hypertension;Lipids;Stress       Review Emil started cardiac rehab on 01/16/24. Shakema did fair with exercise. Vital sig s were stable.       Expected Outcomes Shakema will continue to participate in cardiac rehab for exercise, nutrition and lifestyle modifications          Exercise Goals and Review:  Exercise Goals     Row Name 12/29/23 1351             Exercise Goals   Increase Physical Activity Yes       Intervention Provide advice, education, support and counseling about physical activity/exercise needs.;Develop an individualized exercise prescription for aerobic and resistive training based on initial evaluation findings, risk stratification, comorbidities and participant's personal goals.       Expected Outcomes Long Term: Exercising regularly at least 3-5 days a week.;Short Term: Attend rehab on a regular basis to increase amount of physical activity.;Long Term: Add in home exercise to make exercise part of routine and to increase amount of physical activity.       Increase Strength and Stamina Yes       Intervention Provide advice, education, support and counseling about physical activity/exercise needs.;Develop an individualized exercise prescription for aerobic and resistive training based on initial evaluation  findings, risk stratification, comorbidities and participant's personal goals.       Expected Outcomes Short Term: Increase workloads from initial exercise prescription for resistance, speed, and METs.;Short Term:  Perform resistance training exercises routinely during rehab and add in resistance training at home;Long Term: Improve cardiorespiratory fitness, muscular endurance and strength as measured by increased METs and functional capacity ( )       Able to understand and use rate of perceived exertion (RPE) scale Yes       Intervention Provide education and explanation on how to use RPE scale       Expected Outcomes Short Term: Able to use RPE daily in rehab to express subjective intensity level;Long Term:  Able to use RPE to guide intensity level when exercising independently       Knowledge and understanding of Target Heart Rate Range (THRR) Yes       Intervention Provide education and explanation of THRR including how the numbers were predicted and where they are located for reference       Expected Outcomes Short Term: Able to state/look up THRR;Long Term: Able to use THRR to govern intensity when exercising independently;Short Term: Able to use daily as guideline for intensity in rehab       Understanding of Exercise Prescription Yes       Intervention Provide education, explanation, and written materials on patient's individual exercise prescription       Expected Outcomes Short Term: Able to explain program exercise prescription;Long Term: Able to explain home exercise prescription to exercise independently          Exercise Goals Re-Evaluation:  Exercise Goals Re-Evaluation     Row Name 01/16/24 1134             Exercise Goal Re-Evaluation   Exercise Goals Review Increase Physical Activity;Increase Strength and Stamina;Able to understand and use rate of perceived exertion (RPE) scale       Comments Kaleesi was able to understand and use RPE scale appropriately.       Expected Outcomes Progress workloads as tolerated to help increase cardiorespiratory fitness.          Nutrition & Weight - Outcomes:  Pre Biometrics - 12/29/23 1100       Pre Biometrics   Waist Circumference 53.25  inches    Hip Circumference 57.5 inches    Waist to Hip Ratio 0.93 %    Triceps Skinfold 60 mm    % Body Fat 60.4 %    Grip Strength 32 kg    Flexibility 11 in    Single Leg Stand 12.8 seconds           Nutrition:  Nutrition Therapy & Goals - 01/16/24 1142       Nutrition Therapy   Diet Heart Healthy    Drug/Food Interactions Statins/Certain Fruits      Personal Nutrition Goals   Nutrition Goal Patient to improve diet quality by using the plate method as a guide for meal planning to include lean protein/plant protein, fruits, vegetables, whole grains, nonfat dairy as part of a well-balanced diet.    Comments Pt with PMH of CAD s/p NSTEMI 2023 with PCI to proximal left circumflex and PTCA of proximal left circumflex/OM on 11/02/2023, HLD, HTN. Pt verbalizes desire to increase lean protein intake at meals as well as to practice portion control. Most recent LDL levels elevated to 94 mg/dL on 1/78/74. Current BMI > 40kg/m2.      Intervention Plan   Intervention  Prescribe, educate and counsel regarding individualized specific dietary modifications aiming towards targeted core components such as weight, hypertension, lipid management, diabetes, heart failure and other comorbidities.;Nutrition handout(s) given to patient.   Handout: MyPlate for Meal Planning   Expected Outcomes Short Term Goal: Understand basic principles of dietary content, such as calories, fat, sodium, cholesterol and nutrients.;Long Term Goal: Adherence to prescribed nutrition plan.          Nutrition Discharge:   Education Questionnaire Score:  Knowledge Questionnaire Score - 12/29/23 1342       Knowledge Questionnaire Score   Pre Score 20/24          Darcell attended 4 exercise and education classes between 12/29/23- 01/18/24. Aylssa has returned to work and has been discharged due to nonattendance.Hadassah Elpidio Quan RN BSN

## 2024-02-03 ENCOUNTER — Encounter (HOSPITAL_COMMUNITY): Payer: MEDICAID

## 2024-02-06 ENCOUNTER — Encounter (HOSPITAL_COMMUNITY): Payer: MEDICAID

## 2024-02-08 ENCOUNTER — Ambulatory Visit: Payer: MEDICAID | Attending: Cardiology | Admitting: Cardiology

## 2024-02-08 ENCOUNTER — Encounter (HOSPITAL_COMMUNITY): Payer: MEDICAID

## 2024-02-13 ENCOUNTER — Ambulatory Visit (INDEPENDENT_AMBULATORY_CARE_PROVIDER_SITE_OTHER): Payer: MEDICAID

## 2024-02-13 ENCOUNTER — Ambulatory Visit: Payer: MEDICAID | Admitting: Podiatry

## 2024-02-13 ENCOUNTER — Encounter: Payer: Self-pay | Admitting: Podiatry

## 2024-02-13 ENCOUNTER — Other Ambulatory Visit: Payer: Self-pay | Admitting: Physician Assistant

## 2024-02-13 VITALS — Ht 63.0 in | Wt 281.0 lb

## 2024-02-13 DIAGNOSIS — L97529 Non-pressure chronic ulcer of other part of left foot with unspecified severity: Secondary | ICD-10-CM | POA: Diagnosis not present

## 2024-02-13 DIAGNOSIS — M2042 Other hammer toe(s) (acquired), left foot: Secondary | ICD-10-CM | POA: Diagnosis not present

## 2024-02-13 DIAGNOSIS — Z1231 Encounter for screening mammogram for malignant neoplasm of breast: Secondary | ICD-10-CM

## 2024-02-13 DIAGNOSIS — M2041 Other hammer toe(s) (acquired), right foot: Secondary | ICD-10-CM

## 2024-02-13 DIAGNOSIS — L97519 Non-pressure chronic ulcer of other part of right foot with unspecified severity: Secondary | ICD-10-CM

## 2024-02-13 DIAGNOSIS — D492 Neoplasm of unspecified behavior of bone, soft tissue, and skin: Secondary | ICD-10-CM | POA: Diagnosis not present

## 2024-02-13 NOTE — Progress Notes (Signed)
 Subjective:   Patient ID: [name], female   DOB: 43 y.o.   MRN: [id number]   HPI Patient presents stating that she has a lot of pain underneath both feet and it makes it hard to walk.  States that this has been going on for a while and she has had history of warts in other areas.  Patient also is concerned about digital deformity with elevated lesser toes and does not smoke likes to be active   Review of Systems  All other systems reviewed and are negative.       Objective:  Physical Exam Vitals and nursing note reviewed.  Constitutional:      Appearance: She is well-developed.  Pulmonary:     Effort: Pulmonary effort is normal.  Musculoskeletal:        General: Normal range of motion.  Skin:    General: Skin is warm.  Neurological:     Mental Status: She is alert.     Neurovascular status found to be intact muscle strength found to be adequate range of motion within normal limits with patient found to have significant keratotic lesions subsecond metatarsal bilateral with pain to pressure and pinpoint bleeding upon debridement around the second metatarsal with elevated lesser digits right and left second toes left being worse than right.  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  I did today do sharp sterile debridement of lesions exposed tissue and applied chemical agent to create immune response with sterile dressings and explained what to do if blistering were to occur.  If these come back quickly we may have to consider primary resection of neoplasm with digital fusion and elevating osteotomy and that decision will be made based on the response to conservative treatment done today  X-rays indicate lesions are around second metatarsal head with elevated lesser digits bilateral

## 2024-03-09 ENCOUNTER — Telehealth: Payer: Self-pay | Admitting: Cardiology

## 2024-03-09 NOTE — Telephone Encounter (Signed)
" °*  STAT* If patient is at the pharmacy, call can be transferred to refill team.   1. Which medications need to be refilled? (please list name of each medication and dose if known)   ticagrelor  (BRILINTA ) 90 MG TABS tablet     2. Would you like to learn more about the convenience, safety, & potential cost savings by using the [facility]?    3. Are you open to using the [facility] (Type [facility].    4. Which pharmacy/location (including street and city if local pharmacy) is medication to be sent to? [facility] [phone] - [place], [place] - [address]    5. Do they need a 30 day or 90 day supply? 90  PT is out of meds  "

## 2024-03-12 DIAGNOSIS — I2510 Atherosclerotic heart disease of native coronary artery without angina pectoris: Secondary | ICD-10-CM

## 2024-03-12 MED ORDER — TICAGRELOR 90 MG PO TABS: 90.0000 mg | ORAL_TABLET | Freq: Two times a day (BID) | ORAL | 11 refills | Status: AC

## 2024-03-13 ENCOUNTER — Ambulatory Visit: Payer: MEDICAID

## 2024-03-14 ENCOUNTER — Ambulatory Visit
Admission: RE | Admit: 2024-03-14 | Discharge: 2024-03-14 | Disposition: A | Discharge status: Home / Self Care | Payer: MEDICAID | Source: Ambulatory Visit | Attending: Physician Assistant | Admitting: Physician Assistant

## 2024-03-14 DIAGNOSIS — Z1231 Encounter for screening mammogram for malignant neoplasm of breast: Secondary | ICD-10-CM

## 2024-03-15 ENCOUNTER — Encounter: Payer: Self-pay | Admitting: Cardiology

## 2024-03-15 ENCOUNTER — Other Ambulatory Visit (HOSPITAL_COMMUNITY): Payer: Self-pay

## 2024-03-15 ENCOUNTER — Ambulatory Visit: Payer: MEDICAID | Attending: Cardiology | Admitting: Cardiology

## 2024-03-15 VITALS — BP 94/62 | HR 92 | Ht 63.0 in | Wt 287.9 lb

## 2024-03-15 DIAGNOSIS — I493 Ventricular premature depolarization: Secondary | ICD-10-CM | POA: Diagnosis present

## 2024-03-15 DIAGNOSIS — I471 Supraventricular tachycardia, unspecified: Secondary | ICD-10-CM | POA: Insufficient documentation

## 2024-03-15 DIAGNOSIS — I2510 Atherosclerotic heart disease of native coronary artery without angina pectoris: Secondary | ICD-10-CM | POA: Diagnosis present

## 2024-03-15 DIAGNOSIS — E782 Mixed hyperlipidemia: Secondary | ICD-10-CM | POA: Diagnosis present

## 2024-03-15 MED ORDER — PANTOPRAZOLE SODIUM 40 MG PO TBEC
40.0000 mg | DELAYED_RELEASE_TABLET | Freq: Every day | ORAL | 1 refills | Status: AC
  Filled 2024-03-15: qty 90, 90d supply, fill #0

## 2024-03-15 MED ORDER — CLOPIDOGREL BISULFATE 75 MG PO TABS
75.0000 mg | ORAL_TABLET | Freq: Every day | ORAL | 3 refills | Status: AC
  Filled 2024-03-15: qty 90, 90d supply, fill #0

## 2024-03-15 MED ORDER — REPATHA SURECLICK 140 MG/ML ~~LOC~~ SOAJ
1.0000 mL | SUBCUTANEOUS | 3 refills | Status: AC
  Filled 2024-03-15: qty 6, 84d supply, fill #0

## 2024-03-15 MED ORDER — DILTIAZEM HCL 30 MG PO TABS
30.0000 mg | ORAL_TABLET | Freq: Three times a day (TID) | ORAL | 1 refills | Status: AC | PRN
  Filled 2024-03-15: qty 90, 30d supply, fill #0

## 2024-03-15 NOTE — Progress Notes (Signed)
 " Cardiology Office Note:  .   Date:  03/15/2024  ID:  [name], DOB 1981/02/12, MRN [id number] PCP: [name], PA  [facility] Providers Cardiologist:  Newman [name], MD PCP: [name], [place]  Chief Complaint  Patient presents with   Coronary Artery Disease     [name] is a 44 y.o. female with hypertension, hyperlipidemia, prediabetes, obesity, elevated Lp(a), CAD, PSVT, VE  Discussed the use of AI scribe software for clinical note transcription with the patient, who gave verbal consent to proceed.  History of Present Illness [name] is a 44 year old female who presents with lightheadedness and dizziness following a cardiac procedure.  She underwent a drug-coated balloon procedure in August and has had palpitations and episodes of rapid heartbeat since.  Since the last visit she has had persistent lightheadedness and dizziness. Her recent blood pressure at her primary doctor's office has been about 116/86.  She takes diltiazem  and metoprolol  for rapid heartbeat episodes.  She is scheduled for a dental procedure pending medical clearance and is taking aspirin  and Brilinta  for blood thinning after her cardiac procedure.  She has had no recent chest pain or atrial fibrillation.      Vitals:   03/15/24 1036  BP: 94/62  Pulse: 92  SpO2: 98%      Review of Systems  Cardiovascular:  Negative for chest pain, dyspnea on exertion, leg swelling, palpitations and syncope.  Neurological:  Positive for light-headedness.        Studies Reviewed: [name]        Zio patch monitor 7 days 01/04/2024 - 01/12/2024: Dominant rhythm: Sinus. HR 54-155 bpm. Avg HR 104 bpm. 7 episodes of SVT/atrial tachycardia, fastest at 117 bpm for 9 beats, longest for 13 beats at 107 bpm. <1% isolated SVE,  couplet/triplets. 0 episodes of VT. 5.1% isolated VE, <1% couplet/triplets. No atrial fibrillation/atrial flutter/VT/high grade AV block,  sinus pause >3sec noted. 7 patient triggered events, correlated with VE.   Echocardiogram 10/2023:  1. Left ventricular ejection fraction, by estimation, is 55 to 60%. The  left ventricle has normal function. The left ventricle has no regional  wall motion abnormalities. Left ventricular diastolic parameters were  normal.   2. Right ventricular systolic function is normal. The right ventricular  size is normal. Tricuspid regurgitation signal is inadequate for assessing  PA pressure.   3. The mitral valve is grossly normal. Trivial mitral valve  regurgitation. No evidence of mitral stenosis.   4. The aortic valve is tricuspid. Aortic valve regurgitation is mild. No  aortic stenosis is present.   5. The inferior vena cava is normal in size with greater than 50%  respiratory variability, suggesting right atrial pressure of 3 mmHg.    Coronary angiography & intervention 11/02/2023: LM: Normal LAD: No significant disease Lcx: Large, dominant         Prox stent with 80% restenosis         Large trifurcating OM1 with ostial 70% stenosis RCA: Small, non-dominant vessel. No significant disease   LVEDP 23 mmHg   Successful percutaneous coronary intervention prox Lcx/OM      Intravascular ultrasound (IVUS)      DCB PTCA prox Lcx 3.0 X 30 mm Agent drug coated balloon      PTCA OM1 with 3.5 X 8 mm balloon      Suspicion for small thrombus at ostium of OM1.  Given TIMI-3 flow, no chest pain, no EKG changes, and  pressures of DCB and left circumflex stent, I wanted to avoid further manipulation of the vessel.  Therefore, I did not perform any further intervention, including IVUS.  I will place the patient on 6 hours of Aggrastat  and monitor overnight.   Patient was previously on aspirin  and Brilinta .  I will continue the same for another 1 year. Will make sure that patient's Repatha  is refilled, and emphasize compliance with     Labs 01/2024: Chol 95, TG 70, HDL 49, LDL 31 Hb 10.9 Cr  1.0    Physical Exam Vitals and nursing note reviewed.  Constitutional:      General: She is not in acute distress. Neck:     Vascular: No JVD.  Cardiovascular:     Rate and Rhythm: Normal rate and regular rhythm.     Heart sounds: Normal heart sounds. No murmur heard. Pulmonary:     Effort: Pulmonary effort is normal.     Breath sounds: Normal breath sounds. No wheezing or rales.  Musculoskeletal:     Right lower leg: No edema.     Left lower leg: No edema.      VISIT DIAGNOSES:   ICD-10-CM   1. Coronary artery disease involving native coronary artery of native heart without angina pectoris  I25.10     2. Mixed hyperlipidemia  E78.2     3. PVC (premature ventricular contraction)  I49.3     4. PSVT (paroxysmal supraventricular tachycardia)  I47.10        [name] is a 44 y.o. female with hypertension, hyperlipidemia, prediabetes, obesity, elevated Lp(a), CAD, PSVT, VE  Assessment & Plan Paroxysmal supraventricular tachycardia and ventricular premature beats Palpitations and rapid heartbeat likely due to medications. No recent atrial fibrillation. - Discontinued diltiazem  and metoprolol  due to low blood pressure and lightheadedness. - Prescribed short-acting diltiazem  as needed for rapid heartbeat.  CAD: No cardiac balloon angioplasty for severe in-stent restenosis in 10/2023. No chest pain symptoms at this time. She has been on nearly 5 months of DAPT at this time.  Okay to stop aspirin  and Brilinta  5 days before upcoming dental procedure, and then start Plavix  75 mg daily. Given interaction with omeprazole , switching to pantoprazole  40 mg daily. If patient continues to have GERD symptoms on pantoprazole , recommend follow-up with PCP and/for gastroenterology. Even though her hemoglobin is low, fortunately she denies any melena, hematochezia at this time.  Mixed hyperlipidemia Cholesterol levels well controlled. - Refilled Repatha  prescription.          Meds ordered this encounter  Medications   Evolocumab  (REPATHA  SURECLICK) 140 MG/ML SOAJ    Sig: Inject 140 mg into the skin every 14 (fourteen) days.    Dispense:  6 mL    Refill:  3    Prior auth approved 07/23/2021 until further notice.   diltiazem  (CARDIZEM ) 30 MG tablet    Sig: Take 1 tablet (30 mg total) by mouth 3 (three) times daily as needed.    Dispense:  90 tablet    Refill:  1   clopidogrel  (PLAVIX ) 75 MG tablet    Sig: Take 1 tablet (75 mg total) by mouth daily.    Dispense:  90 tablet    Refill:  3    Plavix  to be started 1 day after dental procedure and Aspirin  and Brilinta  to be stopped 5 days before dental procedure.   pantoprazole  (PROTONIX ) 40 MG tablet    Sig: Take 1 tablet (40 mg total) by mouth daily.  Dispense:  90 tablet    Refill:  1     F/u in 6 months  Signed, Newman JINNY [name], MD  "

## 2024-03-15 NOTE — Patient Instructions (Addendum)
 Medication Instructions:  STOP Diltiazem  120 mg  STOP metoprolol   STOP omeprazole    START Plavix  75 mg daily one day after dental procedure  START diltiazem  30 mg three times a day as needed  START Pantoprazole   40 mg daily   INSTRUCTIONS FOR DENTAL PROCEDURE (PAPERWORK HAS BEEN FAXED):  STOP aspirin  and Brilinta  5 days before procedure and start Plavix  75 mg daily 1 day after dental procedure.   *If you need a refill on your cardiac medications before your next appointment, please call your pharmacy*   Follow-Up: At [facility], you and your health needs are our priority.  As part of our continuing mission to provide you with exceptional heart care, our providers are all part of one team.  This team includes your primary Cardiologist (physician) and Advanced Practice Providers or APPs (Physician Assistants and Nurse Practitioners) who all work together to provide you with the care you need, when you need it.  Your next appointment:   6 month(s)  Provider:   Newman JINNY [name], MD

## 2024-04-03 ENCOUNTER — Ambulatory Visit: Payer: MEDICAID | Admitting: Pulmonary Disease

## 2024-04-09 ENCOUNTER — Ambulatory Visit: Payer: Self-pay | Admitting: Obstetrics

## 2024-04-24 ENCOUNTER — Ambulatory Visit: Payer: MEDICAID | Admitting: Cardiology

## 2024-04-30 ENCOUNTER — Ambulatory Visit: Payer: MEDICAID | Admitting: Obstetrics

## 2024-05-01 ENCOUNTER — Ambulatory Visit: Payer: MEDICAID | Admitting: Pulmonary Disease
